# Patient Record
Sex: Male | Born: 1985
Health system: Southern US, Community
[De-identification: ages and names within clinical notes are randomized; demographics above are authoritative.]

## PROBLEM LIST (undated history)

## (undated) DIAGNOSIS — M109 Gout, unspecified: Secondary | ICD-10-CM

## (undated) DIAGNOSIS — Z87442 Personal history of urinary calculi: Secondary | ICD-10-CM

## (undated) DIAGNOSIS — I10 Essential (primary) hypertension: Secondary | ICD-10-CM

## (undated) DIAGNOSIS — K219 Gastro-esophageal reflux disease without esophagitis: Secondary | ICD-10-CM

## (undated) DIAGNOSIS — R945 Abnormal results of liver function studies: Secondary | ICD-10-CM

## (undated) DIAGNOSIS — T8859XA Other complications of anesthesia, initial encounter: Secondary | ICD-10-CM

## (undated) DIAGNOSIS — M1990 Unspecified osteoarthritis, unspecified site: Secondary | ICD-10-CM

## (undated) DIAGNOSIS — R7989 Other specified abnormal findings of blood chemistry: Secondary | ICD-10-CM

## (undated) HISTORY — DX: Gastro-esophageal reflux disease without esophagitis: K21.9

## (undated) HISTORY — DX: Unspecified osteoarthritis, unspecified site: M19.90

## (undated) HISTORY — DX: Abnormal results of liver function studies: R94.5

## (undated) HISTORY — DX: Other specified abnormal findings of blood chemistry: R79.89

## (undated) HISTORY — DX: Gout, unspecified: M10.9

## (undated) HISTORY — PX: WISDOM TOOTH EXTRACTION: SHX21

## (undated) HISTORY — PX: CHOLECYSTECTOMY: SHX55

## (undated) HISTORY — PX: KIDNEY STONE SURGERY: SHX686

---
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
## 2005-02-09 ENCOUNTER — Ambulatory Visit (HOSPITAL_COMMUNITY): Admission: RE | Admit: 2005-02-09 | Discharge: 2005-02-09 | Payer: Self-pay | Admitting: Family Medicine

## 2005-02-11 ENCOUNTER — Ambulatory Visit (HOSPITAL_COMMUNITY): Admission: RE | Admit: 2005-02-11 | Discharge: 2005-02-11 | Payer: Self-pay | Admitting: Urology

## 2005-02-24 ENCOUNTER — Ambulatory Visit (HOSPITAL_COMMUNITY): Admission: RE | Admit: 2005-02-24 | Discharge: 2005-02-24 | Payer: Self-pay | Admitting: Urology

## 2005-02-25 ENCOUNTER — Ambulatory Visit (HOSPITAL_COMMUNITY): Admission: RE | Admit: 2005-02-25 | Discharge: 2005-02-25 | Payer: Self-pay | Admitting: Urology

## 2005-03-01 ENCOUNTER — Ambulatory Visit (HOSPITAL_COMMUNITY): Admission: RE | Admit: 2005-03-01 | Discharge: 2005-03-01 | Payer: Self-pay | Admitting: Urology

## 2005-04-16 ENCOUNTER — Ambulatory Visit (HOSPITAL_COMMUNITY): Admission: RE | Admit: 2005-04-16 | Discharge: 2005-04-16 | Payer: Self-pay | Admitting: Urology

## 2005-05-09 ENCOUNTER — Emergency Department (HOSPITAL_COMMUNITY): Admission: EM | Admit: 2005-05-09 | Discharge: 2005-05-09 | Payer: Self-pay | Admitting: Emergency Medicine

## 2005-06-25 ENCOUNTER — Ambulatory Visit (HOSPITAL_COMMUNITY): Admission: RE | Admit: 2005-06-25 | Discharge: 2005-06-25 | Payer: Self-pay | Admitting: Urology

## 2005-08-18 ENCOUNTER — Ambulatory Visit (HOSPITAL_COMMUNITY): Admission: RE | Admit: 2005-08-18 | Discharge: 2005-08-18 | Payer: Self-pay | Admitting: Family Medicine

## 2009-02-24 ENCOUNTER — Emergency Department (HOSPITAL_COMMUNITY): Admission: EM | Admit: 2009-02-24 | Discharge: 2009-02-24 | Payer: Self-pay | Admitting: Emergency Medicine

## 2011-01-01 ENCOUNTER — Emergency Department (HOSPITAL_COMMUNITY)
Admission: EM | Admit: 2011-01-01 | Discharge: 2011-01-01 | Payer: Self-pay | Source: Home / Self Care | Admitting: Emergency Medicine

## 2011-03-25 LAB — URINALYSIS, ROUTINE W REFLEX MICROSCOPIC
Bilirubin Urine: NEGATIVE
Leukocytes, UA: NEGATIVE
Nitrite: NEGATIVE
Protein, ur: 30 mg/dL — AB
Urobilinogen, UA: 0.2 mg/dL (ref 0.0–1.0)

## 2011-04-30 NOTE — H&P (Signed)
NAMEWILFRIDO, [name]              ACCOUNT NO.:  [id number]   MEDICAL RECORD NO.:  [id number]           PATIENT TYPE:   LOCATION:                                 FACILITY:   PHYSICIAN:  Dennie Maizes, M.D.        DATE OF BIRTH:   DATE OF ADMISSION:  02/24/2005  DATE OF DISCHARGE:  LH                                HISTORY & PHYSICAL   CHIEF COMPLAINT:  Right upper ureter calculus with obstruction, right flank  pain, nausea and vomiting.   HISTORY OF PRESENT ILLNESS:  This 25 year old male was referred to me by Dr.  Renard Matter.  He experienced right flank pain radiating to the front associated  with severe nausea and vomiting.  He also had mild hematuria.  Evaluation  was done with a non-contrast CT scan of the abdomen and pelvis.  This  revealed a 6 mm size right upper ureteral calculus with obstruction and  hydronephrosis.  The patient was unable to pass the stone.  He has undergone  cystoscopy, retrograde pyelogram and right ureteral stent placement on February 11, 2005.  The patient has good pain relief at present.  No history of fever,  chills, voiding difficulty, gross hematuria.  He is brought to [facility] today for ESWL of right upper ureteral catheter.   PAST MEDICAL HISTORY:  1.  No medical illnesses.  2.  Status post cystoscopy and right ureteral stent placement on February 11, 2005.   MEDICATIONS:  None.   ALLERGIES:  None.   PAST SURGICAL HISTORY:  Negative.   FAMILY HISTORY:  Positive for heart disease, hypertension, and diabetes  mellitus.   PHYSICAL EXAMINATION:  VITAL SIGNS:  Height 5 feet 10 inches, weight 201  pounds.  HEENT: Normal.  NECK:  No masses.  LUNGS:  Clear to auscultation.  HEART:  Regular rate and rhythm. No murmurs.  ABDOMEN:  No palpable flank masses.  No costovertebral angle tenderness.  Bladder is not palpable.  Penis and testes are normal.   IMPRESSION:  Right upper ureteral calculus with obstruction, right  hydronephrosis,  right renal colic, status post right ureteral stent  placement.   PLAN:  Extracorporeal shock wave lithotripsy of the right upper ureteral  calculus with IV sedation in the short-stay center.  I have discussed with  the patient and his family regarding the diagnosis, operative details,  alternate treatments, outcome, possible risks and complications.  He agreed  to undergo the procedure.      SK/MEDQ  D:  02/24/2005  T:  02/24/2005  Job:  [id number]   cc:   Angus G. Renard Matter, MD  [address]  [place]  [place] [address]  Fax: [phone]   [facility]  Fax: [phone]

## 2011-04-30 NOTE — Op Note (Signed)
NAMEGREYSON, [name]              ACCOUNT NO.:  [id number]   MEDICAL RECORD NO.:  [id number]          PATIENT TYPE:  AMB   LOCATION:  DAY                           FACILITY:  [facility]   PHYSICIAN:  Dennie Maizes, M.D.   DATE OF BIRTH:  04-27-1986   DATE OF PROCEDURE:  03/01/2005  DATE OF DISCHARGE:                                 OPERATIVE REPORT   PREOPERATIVE DIAGNOSIS:  Right upper ureteral calculus, post ESL.   POSTOPERATIVE DIAGNOSIS:  Right upper ureteral calculus, post ESL.   OPERATIVE PROCEDURE:  Cystoscopy and removal of right ureteral stent.   ANESTHESIA:  General.   SURGEON:  Dennie Maizes, M.D.   COMPLICATIONS:  None.   INDICATIONS FOR PROCEDURE:  This 25 year old male was evaluated for severe  right flank pain. X-rays revealed a 8 x 6 mm size right upper ureteral  calculus with obstruction. The patient has undergone cystoscopy, right  ureteral stent placement and ESL of the right upper ureteral calculus.  Follow-up x-rays revealed good fragmentation of the right upper ureteral  calculus. The patient was taken to the OR today for cystoscopy and removal  of the right ureteral stent.   DESCRIPTION OF PROCEDURE:  General anesthesia was induced and the patient  was placed on the OR table in the dorsolithotomy position. The lower abdomen  and genitalia were prepped and draped in a sterile fashion. Cystoscopy was  done with a 25-French scope. The ureter, prostate and bladder were normal.  The lower end of the stent was then held with the grasping forceps and  removed without any difficulty. The cystoscope was then removed. The patient  was transferred to the PACU in a satisfactory condition.      SK/MEDQ  D:  03/01/2005  T:  03/01/2005  Job:  [id number]

## 2011-04-30 NOTE — Op Note (Signed)
[name], ELLERMAN              ACCOUNT NO.:  [id number]   MEDICAL RECORD NO.:  [id number]          PATIENT TYPE:  AMB   LOCATION:  DAY                           FACILITY:  [facility]   PHYSICIAN:  Dennie Maizes, M.D.   DATE OF BIRTH:  08/15/1986   DATE OF PROCEDURE:  02/11/2005  DATE OF DISCHARGE:                                 OPERATIVE REPORT   PREOPERATIVE DIAGNOSES:  Right upper ureteral calculus with obstruction,  right renal colic, and right hydronephrosis.   POSTOPERATIVE DIAGNOSIS:  Right upper ureteral calculus with obstruction,  right renal colic, and right hydronephrosis.   OPERATIVE PROCEDURE:  Cystoscopy, right retrograde pyelogram, right ureteral  stent placement.   ANESTHESIA:  General.   SURGEON:  Dennie Maizes, M.D.   COMPLICATIONS:  None.   DRAINS:  6 French, 26 cm size right ureteral stent.   INDICATIONS FOR PROCEDURE:  This 25 year old male had severe right flank  pain associated with nausea and vomiting.  His x-rays revealed an 8 x 6 mm  size right upper ureteral calculus with obstruction and hydronephrosis.  The  patient was unable to pass the stone.  He is taken to the OR today for  cystoscopy, right retrograde pyelogram and right ureteral stent placement.  The stone will later be treated with ESL as an outpatient.   DESCRIPTION OF PROCEDURE:  General anesthesia was induced and the patient  was placed on the OR table in the dorsal lithotomy position.  The lower  abdomen and genitalia were prepped and draped in a sterile fashion.  Cystoscopy was done with a 25-French scope.  The appearance of the urethra,  prostate and bladder were normal.  The trigone and ureteral orifices were unremarkable.  A 5-French wedge  catheter was then placed in the right ureteral orifice. About 7 cc of  Renografin 60 was injected into the collecting system and a retrograde  pyelogram was done.  The distal ureter was normal.  There was a large  filling defect in the right  upper ureter with proximal hydroureter and  hydronephrosis.   A 5-French open-ended catheter was then placed in the right distal ureter.  The 0.038-inch Benson guidewire with the flexible tip was then advanced into  the right renal pelvis.  The open-ended catheter was then removed.  A 6-  French, 26 cm size stent was then inserted to the right collecting system  without any difficulty.  The cystoscope was removed.  The patient was  transferred to the PACU in a satisfactory condition.      SK/MEDQ  D:  02/11/2005  T:  02/11/2005  Job:  [id number]   cc:   Angus G. Renard Matter, MD  [address]  [place]  [place] [address]  Fax: [phone]

## 2011-04-30 NOTE — H&P (Signed)
NAMEHENDRIK, [name]              ACCOUNT NO.:  [id number]   MEDICAL RECORD NO.:  [id number]          PATIENT TYPE:  AMB   LOCATION:  DAY                           FACILITY:  [facility]   PHYSICIAN:  Dennie Maizes, M.D.   DATE OF BIRTH:  [birth year]   DATE OF ADMISSION:  03/01/2005  DATE OF DISCHARGE:  LH                                HISTORY & PHYSICAL   CHIEF COMPLAINT:  Right upper ureteral calculus, post ESL.   HISTORY OF PRESENT ILLNESS:  This is an 25 year old male had right flank  pain.  His x-rays revealed an 8 x 6 mm right upper ureteral calculus without  obstruction, and hydronephrosis.  He has undergone cystoscopy, right  ureteral stent placement, and ESL of the right upper ureteral calculus.  Follow up chest x-rays have revealed that the stone has been fragmented  well.  The patient is going well as present.  He does not have flank pain.  He does have grossly hematuria.  He was brought to the short stay center  today for cystoscopy and removal of the right ureteral stent.   PAST MEDICAL HISTORY:  History of right upper ureteral calculus, status post  cystoscopy, right ureteral stent placement, and ESL.   MEDICATIONS:  Percocet p.r.n. for pain.   ALLERGIES:  None.   PHYSICAL EXAMINATION:  HEENT:  Normal.  NECK:  No masses.  LUNGS:  Clear to auscultation.  HEART:  Regular rate and rhythm.  No murmurs.  ABDOMEN:  Soft.  No palpable flank mass, CVA tenderness, or bladder  distention.   IMPRESSION:  Right upper ureteral calculus without obstruction, status post  right ureteral stent placement, post extracorporeal shockwave lithotripsy of  right ureteral calculus.   PLAN:  Cystoscopy and removal of right ureteral stent under anesthesia in  the short stay center.  I have discussed with the patient and his family  regarding diagnosis, operative details, alternative treatments, outcome,  possible risks and complications, and he has agreed for the procedure to be   done.      SK/MEDQ  D:  02/28/2005  T:  03/01/2005  Job:  [id number]   cc:   Angus G. Renard Matter, MD  [address]  [place]  [place] [address]  Fax: [phone]

## 2011-04-30 NOTE — H&P (Signed)
[name], [name]              ACCOUNT NO.:  [id number]   MEDICAL RECORD NO.:  [id number]          PATIENT TYPE:  AMB   LOCATION:  DAY                           FACILITY:  [facility]   PHYSICIAN:  Dennie Maizes, M.D.   DATE OF BIRTH:  21-Feb-1986   DATE OF ADMISSION:  02/11/2005  DATE OF DISCHARGE:  LH                                HISTORY & PHYSICAL   CHIEF COMPLAINT:  Right flank pain, nausea and vomiting.   HISTORY OF PRESENT ILLNESS:  This 25 year old male was referred to me by Dr.  Renard Matter.  He has been having intermittent severe, right, flank pain  radiating to the right lower quadrant of the abdomen for several days.  This  was associated with nausea and vomiting.  He has also noticed mild  hematuria.  Further evaluation was done with a noncontrast CT scan of the  abdomen and pelvis.  This revealed a 6 mm sized, right renal calculous with  obstruction and hydronephrosis.  The patient is unable to pass the stone.  He has persistent severe pain.  He is brought to the short stay center today  for cystoscopy, right retrograde pyelogram and right stent placement.  The  patient denied having any fever, chills or voiding difficulty at present.  There is no past history of urolithiasis.   PAST MEDICAL HISTORY:  No medical illnesses.   MEDICATIONS:  None.   ALLERGIES:  No known drug allergies.   PAST SURGICAL HISTORY:  None.   FAMILY HISTORY:  Positive for heart disease, hypertension, diabetes mellitus  and COPD.   PHYSICAL EXAMINATION:  VITAL SIGNS:  Height 5 feet 10 inches, weight 201  pounds.  HEENT:  Normal.  NECK:  No masses.  LUNGS:  Clear to auscultation.  HEART:  Regular rate and rhythm with no murmurs.  ABDOMEN:  Soft, nonpalpable flank mass.  Moderate costovertebral angle  tenderness was noted.  Bladder not palpable.  GENITALIA:  Penis and testes are normal.   IMPRESSION:  Right renal calculous with obstruction, right hydronephrosis,  right renal calculous.   PLAN:  Cystoscopy, right retrograde pyelogram and right ureteral stent  placement.  I have discussed with the patient regarding the diagnoses,  operative details, alternative treatments, all complications and risks.  He  has agreed for the procedure to be done.      SK/MEDQ  D:  02/11/2005  T:  02/11/2005  Job:  [id number]   cc:   Angus G. Renard Matter, MD  [address]  [place]  [place] [address]  Fax: [phone]

## 2013-05-04 ENCOUNTER — Ambulatory Visit (INDEPENDENT_AMBULATORY_CARE_PROVIDER_SITE_OTHER): Payer: BC Managed Care – PPO | Admitting: Family Medicine

## 2013-05-04 ENCOUNTER — Encounter: Payer: Self-pay | Admitting: Family Medicine

## 2013-05-04 VITALS — BP 120/88 | HR 80 | Ht 71.0 in | Wt 254.0 lb

## 2013-05-04 DIAGNOSIS — M109 Gout, unspecified: Secondary | ICD-10-CM

## 2013-05-04 NOTE — Progress Notes (Signed)
  Subjective:    Patient ID: [name], male    DOB: [birth year], 27 y.o.   MRN: [id number]  Ear Fullness  There is pain in the right ear. This is a new problem. The current episode started 1 to 4 weeks ago. The problem occurs constantly. The problem has been unchanged. There has been no fever. The pain is at a severity of 0/10. The patient is experiencing no pain. He has tried nothing for the symptoms. The treatment provided no relief.  Arthritis Presents for initial visit. The disease course has been stable. He complains of pain. (Gouty arthritis) Affected locations include the left foot and left ankle. His pain is at a severity of 0/10. Treatments tried: indomethacin and oxycodone. The treatment provided significant relief. Factors aggravating his arthritis include activity. Compliance with prior treatments has been good.   Family history negative for gout   Review of Systems  Musculoskeletal: Positive for arthritis.       Objective:   Physical Exam  Vital signs stable. Lungs clear heart regular. Ankle appears normal now no redness drainage.Eardrums were normal.      Assessment & Plan:  Your fullness related to allergies should gradually get better with over-the-counter measures were discussed. Doubt-check uric acid level may need to be on long-term medicine. Warning signs were discussed. He will use Indocin the next few days. If it bothers his stomach he will stop minimize proteins. Patient does not drink.

## 2013-05-05 DIAGNOSIS — M109 Gout, unspecified: Secondary | ICD-10-CM | POA: Insufficient documentation

## 2013-05-08 ENCOUNTER — Other Ambulatory Visit: Payer: Self-pay | Admitting: *Deleted

## 2013-05-08 ENCOUNTER — Telehealth: Payer: Self-pay | Admitting: Family Medicine

## 2013-05-08 ENCOUNTER — Telehealth: Payer: Self-pay | Admitting: *Deleted

## 2013-05-08 DIAGNOSIS — M109 Gout, unspecified: Secondary | ICD-10-CM

## 2013-05-08 NOTE — Telephone Encounter (Signed)
Add cbc and sed rate and met7 and do tom

## 2013-05-08 NOTE — Telephone Encounter (Signed)
Done. Pt aware, will get labs drawn in the morning

## 2013-05-08 NOTE — Telephone Encounter (Signed)
Patient gout has now moved to his knee and he is in a lot of pain. Please advise. Does his bloodwork need to be adjusted?

## 2013-05-08 NOTE — Telephone Encounter (Signed)
Patient was seen May 23, Was told to do Uric acid level 7-10 days after visit, which will be this Friday.  He has not had the uric acid done yet.  He feels the gout has moved to the knee ( some redness, mild swelling, tenderness to touch)  States hurts to bend or move it. He is asking if you still want him to do the uric acid on Friday or do it sooner and if so, would there be any other labs you want to add since his symptoms have moved too the knee per patient.

## 2013-05-08 NOTE — Telephone Encounter (Signed)
Pt aware to get labs done in the morning.

## 2013-05-08 NOTE — Telephone Encounter (Signed)
He has not had BW done yet. He wanted to know if the BW paperwork that he has now needs to be adjusted at all since it has moved in his knee?

## 2013-05-08 NOTE — Telephone Encounter (Deleted)
Was seen here in office May

## 2013-05-08 NOTE — Telephone Encounter (Signed)
Has he done bw? Need results--didn't see in system

## 2013-05-09 LAB — BASIC METABOLIC PANEL
BUN: 16 mg/dL (ref 6–23)
CO2: 27 mEq/L (ref 19–32)
Glucose, Bld: 110 mg/dL — ABNORMAL HIGH (ref 70–99)
Potassium: 4.3 mEq/L (ref 3.5–5.3)
Sodium: 141 mEq/L (ref 135–145)

## 2013-05-09 LAB — CBC WITH DIFFERENTIAL/PLATELET
Basophils Relative: 0 % (ref 0–1)
Eosinophils Absolute: 0.2 10*3/uL (ref 0.0–0.7)
Eosinophils Relative: 3 % (ref 0–5)
Hemoglobin: 15.2 g/dL (ref 13.0–17.0)
Lymphs Abs: 2.6 10*3/uL (ref 0.7–4.0)
Monocytes Relative: 10 % (ref 3–12)
RBC: 5.11 MIL/uL (ref 4.22–5.81)
WBC: 6.2 10*3/uL (ref 4.0–10.5)

## 2013-05-09 LAB — SEDIMENTATION RATE: Sed Rate: 17 mm/hr — ABNORMAL HIGH (ref 0–16)

## 2013-05-09 LAB — URIC ACID: Uric Acid, Serum: 9.4 mg/dL — ABNORMAL HIGH (ref 4.0–7.8)

## 2013-05-14 ENCOUNTER — Telehealth: Payer: Self-pay | Admitting: Family Medicine

## 2013-05-14 NOTE — Telephone Encounter (Signed)
Pt also states that the Pharmacist says he can not take both medications at the same time, however Dr Lorin Picket told pt to take them both together at the same time? What should the patient do?

## 2013-05-14 NOTE — Telephone Encounter (Signed)
Yes he can. Use the allopurinol daily but indocin is tid PRN gout pain

## 2013-05-14 NOTE — Telephone Encounter (Signed)
Medication for the gout (for the flair ups only) called is too expensive, not covered by insurance. Can we call in Indomethacin 25 mg instead, this one is cheaper and insurance will cover this one? [facility]

## 2013-05-14 NOTE — Telephone Encounter (Signed)
Does the patient take with the Allopurinol- the pharmacist told him he wouldn't take meds at same time but he was under impression that did

## 2013-05-14 NOTE — Telephone Encounter (Signed)
Med called into [facility] in [place] Patient notified that he can take meds together.

## 2013-05-14 NOTE — Telephone Encounter (Signed)
Indomethacin 25 mg one tid prn #30 in place of [facility]

## 2013-06-07 ENCOUNTER — Telehealth: Payer: Self-pay | Admitting: Family Medicine

## 2013-06-07 NOTE — Telephone Encounter (Signed)
Pt needs BW paperwork

## 2013-06-07 NOTE — Telephone Encounter (Signed)
Met 7 lipid, unless pt has other concerns

## 2013-06-08 ENCOUNTER — Other Ambulatory Visit: Payer: Self-pay

## 2013-06-08 DIAGNOSIS — Z79899 Other long term (current) drug therapy: Secondary | ICD-10-CM

## 2013-06-08 DIAGNOSIS — Z0000 Encounter for general adult medical examination without abnormal findings: Secondary | ICD-10-CM

## 2013-06-08 NOTE — Telephone Encounter (Signed)
Blood work papers ready for pickup. Patient was notified.  

## 2013-06-13 ENCOUNTER — Ambulatory Visit: Payer: BC Managed Care – PPO | Admitting: Family Medicine

## 2013-06-18 ENCOUNTER — Other Ambulatory Visit: Payer: Self-pay | Admitting: *Deleted

## 2013-06-18 DIAGNOSIS — M109 Gout, unspecified: Secondary | ICD-10-CM

## 2013-06-19 LAB — LIPID PANEL
HDL: 27 mg/dL — ABNORMAL LOW (ref >39)
LDL Cholesterol: 72 mg/dL (ref 0–99)
Total CHOL/HDL Ratio: 6.6 Ratio
VLDL: 78 mg/dL — ABNORMAL HIGH (ref 0–40)

## 2013-06-19 LAB — BASIC METABOLIC PANEL
Chloride: 102 mEq/L (ref 96–112)
Potassium: 3.9 mEq/L (ref 3.5–5.3)

## 2013-06-20 LAB — URIC ACID: Uric Acid, Serum: 7.7 mg/dL (ref 4.0–7.8)

## 2013-06-26 ENCOUNTER — Ambulatory Visit (INDEPENDENT_AMBULATORY_CARE_PROVIDER_SITE_OTHER): Payer: BC Managed Care – PPO | Admitting: Family Medicine

## 2013-06-26 ENCOUNTER — Encounter: Payer: Self-pay | Admitting: Family Medicine

## 2013-06-26 VITALS — BP 128/80 | Temp 98.0°F | Wt 250.8 lb

## 2013-06-26 DIAGNOSIS — L0390 Cellulitis, unspecified: Secondary | ICD-10-CM

## 2013-06-26 DIAGNOSIS — L0291 Cutaneous abscess, unspecified: Secondary | ICD-10-CM

## 2013-06-26 MED ORDER — AZITHROMYCIN 250 MG PO TABS: ORAL_TABLET | ORAL | Status: DC

## 2013-06-26 NOTE — Progress Notes (Signed)
  Subjective:    Patient ID: [name], male    DOB: [birth year], 27 y.o.   MRN: [id number]  HPI Developed red blemish. Puffed up. Was underneath a house round a lot of spiders.  Headache. No fever.  No application of meds.  Chills and needles with adding indomethacin to allopurinol. Had a couple bouts of arthritis. Diagnosed with probable gout. Uric acid was quite high in the nines.   Review of Systems No vomiting no diarrhea no fever no chills ROS otherwise negative    Objective:   Physical Exam  Alert lungs clear. Heart regular rate and rhythm. HEENT normal. Left dorsal hand small blister noted. Joints within normal limits.      Assessment & Plan:  Impression spider bite reaction possible secondary infection. #2 gallop with questionable side effects from indomethacin. Plan avoid indomethacin. Maintain allopurinol. Z-Pak. Symptomatic care discussed. WSL

## 2013-07-03 ENCOUNTER — Ambulatory Visit: Payer: BC Managed Care – PPO | Admitting: Family Medicine

## 2013-09-21 ENCOUNTER — Other Ambulatory Visit: Payer: Self-pay | Admitting: Family Medicine

## 2013-10-11 ENCOUNTER — Telehealth: Payer: Self-pay | Admitting: Family Medicine

## 2013-10-11 NOTE — Telephone Encounter (Signed)
Patient wants bloodwork paper

## 2013-10-16 ENCOUNTER — Other Ambulatory Visit: Payer: Self-pay

## 2013-10-16 DIAGNOSIS — Z79899 Other long term (current) drug therapy: Secondary | ICD-10-CM

## 2013-10-16 DIAGNOSIS — M109 Gout, unspecified: Secondary | ICD-10-CM

## 2013-10-16 DIAGNOSIS — Z0000 Encounter for general adult medical examination without abnormal findings: Secondary | ICD-10-CM

## 2013-10-16 NOTE — Telephone Encounter (Signed)
Bloodwork ordered in Epic. Notified patient's wife bloodwork ordered and patient can report to lab.

## 2013-10-16 NOTE — Telephone Encounter (Signed)
Lip liv uric acid 

## 2013-10-17 LAB — LIPID PANEL
Cholesterol: 182 mg/dL (ref 0–200)
HDL: 29 mg/dL — ABNORMAL LOW (ref >39)

## 2013-10-17 LAB — URIC ACID: Uric Acid, Serum: 6 mg/dL (ref 4.0–7.8)

## 2013-10-17 LAB — HEPATIC FUNCTION PANEL
ALT: 93 U/L — ABNORMAL HIGH (ref 0–53)
AST: 46 U/L — ABNORMAL HIGH (ref 0–37)
Bilirubin, Direct: 0.1 mg/dL (ref 0.0–0.3)
Total Protein: 7.2 g/dL (ref 6.0–8.3)

## 2013-10-18 ENCOUNTER — Ambulatory Visit (INDEPENDENT_AMBULATORY_CARE_PROVIDER_SITE_OTHER): Payer: BC Managed Care – PPO | Admitting: Family Medicine

## 2013-10-18 ENCOUNTER — Encounter: Payer: Self-pay | Admitting: Family Medicine

## 2013-10-18 VITALS — BP 124/88 | Ht 71.0 in | Wt 249.6 lb

## 2013-10-18 DIAGNOSIS — R748 Abnormal levels of other serum enzymes: Secondary | ICD-10-CM

## 2013-10-18 DIAGNOSIS — E785 Hyperlipidemia, unspecified: Secondary | ICD-10-CM

## 2013-10-18 DIAGNOSIS — R7989 Other specified abnormal findings of blood chemistry: Secondary | ICD-10-CM | POA: Insufficient documentation

## 2013-10-18 MED ORDER — ALLOPURINOL 300 MG PO TABS: ORAL_TABLET | ORAL | Status: DC

## 2013-10-18 MED ORDER — INDOMETHACIN 50 MG PO CAPS: 50.0000 mg | ORAL_CAPSULE | Freq: Four times a day (QID) | ORAL | Status: DC | PRN

## 2013-10-18 NOTE — Progress Notes (Signed)
  Subjective:    Patient ID: [name], male    DOB: 04-17-1986, 27 y.o.   MRN: [id number]  HPI  Patient is here today for a recheck on gout. He states it was better, but now it is back again. He also states his shoulders and elbows are hurting now.   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 He presume that he was experiencing gout in his elbows. He claims compliance with his allopurinol. No obvious side effects from this.  Had a flare and took the indomethacin on as needed basis  Feet no flare of arthritis  Hx of kidney stones Patient had hyperlipidemia on prior blood work. States he has worked a bit on trying to cut fats down diet but not as hard as he possibly could. Not exercising regularly.  Blood work shows other difficulties that we need to discuss today  Review of Systems    no chest pain no headache no back pain no abdominal pain no change in bowel habits ROS otherwise negative Objective:   Physical Exam  Alert hydration good. HEENT normal. Heart rare rhythm lateral elbows tender to palpation. Ankles feet within normal limits pulses good sensation good no inflammation      Assessment & Plan:  Impression 1 gout much improved. #2 lateral epicondylitis both elbows discussed not gout. #3 hyperlipidemia numbers better but not ideal discussed #4 elevated liver function tests. We did this in case he needed to go on lipid medicine. Unfortunately there elevated patient has no known history of this. He does not drink alcohol plan appropriate workup for elevated liver enzymes discussed and initiated. Maintain allopurinol. Use indomethacin when necessary. Low-fat diet discussed in encourage. Recheck in 6 months. Easily 25 minutes spent most in discussion. WSL

## 2013-10-19 ENCOUNTER — Ambulatory Visit: Payer: BC Managed Care – PPO | Admitting: Family Medicine

## 2013-10-23 ENCOUNTER — Ambulatory Visit (HOSPITAL_COMMUNITY)
Admission: RE | Admit: 2013-10-23 | Discharge: 2013-10-23 | Disposition: A | Discharge status: Home / Self Care | Payer: BC Managed Care – PPO | Source: Ambulatory Visit | Attending: Family Medicine | Admitting: Family Medicine

## 2013-10-23 DIAGNOSIS — R7989 Other specified abnormal findings of blood chemistry: Secondary | ICD-10-CM | POA: Insufficient documentation

## 2013-10-23 DIAGNOSIS — R748 Abnormal levels of other serum enzymes: Secondary | ICD-10-CM

## 2013-10-23 LAB — HEPATITIS C ANTIBODY: HCV Ab: NEGATIVE

## 2013-10-23 LAB — FERRITIN: Ferritin: 401 ng/mL — ABNORMAL HIGH (ref 22–322)

## 2013-10-23 LAB — HEPATITIS B SURFACE ANTIGEN: Hepatitis B Surface Ag: NEGATIVE

## 2014-01-21 ENCOUNTER — Encounter: Payer: Self-pay | Admitting: Nurse Practitioner

## 2014-01-21 ENCOUNTER — Ambulatory Visit (INDEPENDENT_AMBULATORY_CARE_PROVIDER_SITE_OTHER): Payer: BC Managed Care – PPO | Admitting: Nurse Practitioner

## 2014-01-21 VITALS — BP 132/86 | Temp 98.9°F | Ht 71.0 in | Wt 245.0 lb

## 2014-01-21 DIAGNOSIS — L089 Local infection of the skin and subcutaneous tissue, unspecified: Secondary | ICD-10-CM

## 2014-01-21 DIAGNOSIS — B958 Unspecified staphylococcus as the cause of diseases classified elsewhere: Secondary | ICD-10-CM

## 2014-01-21 DIAGNOSIS — R1011 Right upper quadrant pain: Secondary | ICD-10-CM

## 2014-01-21 DIAGNOSIS — K921 Melena: Secondary | ICD-10-CM

## 2014-01-21 DIAGNOSIS — K219 Gastro-esophageal reflux disease without esophagitis: Secondary | ICD-10-CM

## 2014-01-21 LAB — CBC WITH DIFFERENTIAL/PLATELET
Basophils Absolute: 0 10*3/uL (ref 0.0–0.1)
Basophils Relative: 1 % (ref 0–1)
EOS PCT: 2 % (ref 0–5)
Eosinophils Absolute: 0.1 10*3/uL (ref 0.0–0.7)
HEMATOCRIT: 44.2 % (ref 39.0–52.0)
HEMOGLOBIN: 15.3 g/dL (ref 13.0–17.0)
LYMPHS ABS: 3.3 10*3/uL (ref 0.7–4.0)
LYMPHS PCT: 42 % (ref 12–46)
MCH: 30.2 pg (ref 26.0–34.0)
MCHC: 34.6 g/dL (ref 30.0–36.0)
MCV: 87.4 fL (ref 78.0–100.0)
MONOS PCT: 8 % (ref 3–12)
Monocytes Absolute: 0.7 10*3/uL (ref 0.1–1.0)
Neutro Abs: 3.7 10*3/uL (ref 1.7–7.7)
Neutrophils Relative %: 47 % (ref 43–77)
Platelets: 283 10*3/uL (ref 150–400)
RBC: 5.06 MIL/uL (ref 4.22–5.81)
RDW: 13.7 % (ref 11.5–15.5)
WBC: 7.8 10*3/uL (ref 4.0–10.5)

## 2014-01-21 MED ORDER — MUPIROCIN 2 % EX OINT: TOPICAL_OINTMENT | CUTANEOUS | Status: DC

## 2014-01-21 MED ORDER — PANTOPRAZOLE SODIUM 40 MG PO TBEC: 40.0000 mg | DELAYED_RELEASE_TABLET | Freq: Every day | ORAL | Status: DC

## 2014-01-21 MED ORDER — AMOXICILLIN-POT CLAVULANATE 875-125 MG PO TABS: 1.0000 | ORAL_TABLET | Freq: Two times a day (BID) | ORAL | Status: DC

## 2014-01-21 NOTE — Patient Instructions (Signed)
Gastroesophageal Reflux Disease, Adult  Gastroesophageal reflux disease (GERD) happens when acid from your stomach flows up into the esophagus. When acid comes in contact with the esophagus, the acid causes soreness (inflammation) in the esophagus. Over time, GERD may create small holes (ulcers) in the lining of the esophagus.  CAUSES   · Increased body weight. This puts pressure on the stomach, making acid rise from the stomach into the esophagus.  · Smoking. This increases acid production in the stomach.  · Drinking alcohol. This causes decreased pressure in the lower esophageal sphincter (valve or ring of muscle between the esophagus and stomach), allowing acid from the stomach into the esophagus.  · Late evening meals and a full stomach. This increases pressure and acid production in the stomach.  · A malformed lower esophageal sphincter.  Sometimes, no cause is found.  SYMPTOMS   · Burning pain in the lower part of the mid-chest behind the breastbone and in the mid-stomach area. This may occur twice a week or more often.  · Trouble swallowing.  · Sore throat.  · Dry cough.  · Asthma-like symptoms including chest tightness, shortness of breath, or wheezing.  DIAGNOSIS   Your caregiver may be able to diagnose GERD based on your symptoms. In some cases, X-rays and other tests may be done to check for complications or to check the condition of your stomach and esophagus.  TREATMENT   Your caregiver may recommend over-the-counter or prescription medicines to help decrease acid production. Ask your caregiver before starting or adding any new medicines.   HOME CARE INSTRUCTIONS   · Change the factors that you can control. Ask your caregiver for guidance concerning weight loss, quitting smoking, and alcohol consumption.  · Avoid foods and drinks that make your symptoms worse, such as:  · Caffeine or alcoholic drinks.  · Chocolate.  · Peppermint or mint flavorings.  · Garlic and onions.  · Spicy foods.  · Citrus fruits,  such as oranges, lemons, or limes.  · Tomato-based foods such as sauce, chili, salsa, and pizza.  · Fried and fatty foods.  · Avoid lying down for the 3 hours prior to your bedtime or prior to taking a nap.  · Eat small, frequent meals instead of large meals.  · Wear loose-fitting clothing. Do not wear anything tight around your waist that causes pressure on your stomach.  · Raise the head of your bed 6 to 8 inches with wood blocks to help you sleep. Extra pillows will not help.  · Only take over-the-counter or prescription medicines for pain, discomfort, or fever as directed by your caregiver.  · Do not take aspirin, ibuprofen, or other nonsteroidal anti-inflammatory drugs (NSAIDs).  SEEK IMMEDIATE MEDICAL CARE IF:   · You have pain in your arms, neck, jaw, teeth, or back.  · Your pain increases or changes in intensity or duration.  · You develop nausea, vomiting, or sweating (diaphoresis).  · You develop shortness of breath, or you faint.  · Your vomit is green, yellow, black, or looks like coffee grounds or blood.  · Your stool is red, bloody, or black.  These symptoms could be signs of other problems, such as heart disease, gastric bleeding, or esophageal bleeding.  MAKE SURE YOU:   · Understand these instructions.  · Will watch your condition.  · Will get help right away if you are not doing well or get worse.  Document Released: 09/08/2005 Document Revised: 02/21/2012 Document Reviewed: 06/18/2011  ExitCare® Patient   Information ©2014 [employer].  Diet for Gastroesophageal Reflux Disease, Adult  Reflux (acid reflux) is when acid from your stomach flows up into the esophagus. When acid comes in contact with the esophagus, the acid causes irritation and soreness (inflammation) in the esophagus. When reflux happens often or so severely that it causes damage to the esophagus, it is called gastroesophageal reflux disease (GERD). Nutrition therapy can help ease the discomfort of GERD.  FOODS OR DRINKS TO AVOID OR  LIMIT  · Smoking or chewing tobacco. Nicotine is one of the most potent stimulants to acid production in the gastrointestinal tract.  · Caffeinated and decaffeinated coffee and black tea.  · Regular or low-calorie carbonated beverages or energy drinks (caffeine-free carbonated beverages are allowed).    · Strong spices, such as black pepper, white pepper, red pepper, cayenne, curry powder, and chili powder.  · Peppermint or spearmint.  · Chocolate.  · High-fat foods, including meats and fried foods. Extra added fats including oils, butter, salad dressings, and nuts. Limit these to less than 8 tsp per day.  · Fruits and vegetables if they are not tolerated, such as citrus fruits or tomatoes.  · Alcohol.  · Any food that seems to aggravate your condition.  If you have questions regarding your diet, call your caregiver or a registered dietitian.  OTHER THINGS THAT MAY HELP GERD INCLUDE:   · Eating your meals slowly, in a relaxed setting.  · Eating 5 to 6 small meals per day instead of 3 large meals.  · Eliminating food for a period of time if it causes distress.  · Not lying down until 3 hours after eating a meal.  · Keeping the head of your bed raised 6 to 9 inches (15 to 23 cm) by using a foam wedge or blocks under the legs of the bed. Lying flat may make symptoms worse.  · Being physically active. Weight loss may be helpful in reducing reflux in overweight or obese adults.  · Wear loose fitting clothing  EXAMPLE MEAL PLAN  This meal plan is approximately 2,000 calories based on ChooseMyPlate.gov meal planning guidelines.  Breakfast  · ½ cup cooked oatmeal.  · 1 cup strawberries.  · 1 cup low-fat milk.  · 1 oz almonds.  Snack  · 1 cup cucumber slices.  · 6 oz yogurt (made from low-fat or fat-free milk).  Lunch  · 2 slice whole-wheat bread.  · 2½ oz sliced turkey.  · 2 tsp mayonnaise.  · 1 cup blueberries.  · 1 cup snap peas.  Snack  · 6 whole-wheat crackers.  · 1 oz string cheese.  Dinner  · ½ cup brown rice.  · 1  cup mixed veggies.  · 1 tsp olive oil.  · 3 oz grilled fish.  Document Released: 11/29/2005 Document Revised: 02/21/2012 Document Reviewed: 10/15/2011  ExitCare® Patient Information ©2014 [employer].

## 2014-01-22 LAB — HEPATIC FUNCTION PANEL
ALK PHOS: 67 U/L (ref 39–117)
ALT: 51 U/L (ref 0–53)
AST: 31 U/L (ref 0–37)
Albumin: 4.9 g/dL (ref 3.5–5.2)
BILIRUBIN DIRECT: 0.2 mg/dL (ref 0.0–0.3)
BILIRUBIN INDIRECT: 0.6 mg/dL (ref 0.2–1.2)
Total Bilirubin: 0.8 mg/dL (ref 0.2–1.2)
Total Protein: 7.4 g/dL (ref 6.0–8.3)

## 2014-01-22 LAB — LIPASE: Lipase: 16 U/L (ref 0–75)

## 2014-01-22 LAB — H. PYLORI ANTIBODY, IGG: H Pylori IgG: <0.4 {ISR}

## 2014-01-24 ENCOUNTER — Encounter: Payer: Self-pay | Admitting: Nurse Practitioner

## 2014-01-24 DIAGNOSIS — K219 Gastro-esophageal reflux disease without esophagitis: Secondary | ICD-10-CM | POA: Insufficient documentation

## 2014-01-24 NOTE — Progress Notes (Signed)
Subjective:  Presents complaints of a rash in the left axillary area that began 2 days ago. Has had a similar problem once before. Mildly tender to palpation. No fever. No drainage. Also complaints of worsening acid reflux. Has a long-term history of this. Using OTC med, unsure of name, thinks it's omeprazole. No nausea vomiting. Chronic history of slightly loose stools, slightly black towards stools at least once per week. Denies any use OTC meds that would change the color of his stool. Nonsmoker. No oral tobacco use. No alcohol use. Drinks a large amount of caffeine daily. Epigastric area discomfort at times radiating into the chest area. Denies any excessive NSAID use. Extremely tight feeling in the epigastric area when he first starts eating, within 10-15 minutes then subsides with no further pain noted. Seems to be worse with tomato based foods. His mother also has a history of reflux disease. Patient also has a history of elevated liver enzymes. Had an abdominal RUQ ultrasound In November. Objective:   BP 132/86  Temp(Src) 98.9 F (37.2 C) (Oral)  Ht 5\' 11"  (1.803 m)  Wt 245 lb (111.131 kg)  BMI 34.19 kg/m2 NAD. Alert, oriented. Lungs clear. Heart regular rate rhythm. Superficial small eroded area noted towards the posterior left axillary area with a slight yellowish dried drainage. Minimally tender to palpation. Abdomen soft nondistended with active bowel sounds x4; tenderness noted towards the right epigastric area into the mid to right upper quadrant. No rebound or guarding. No obvious masses.  Assessment:Abdominal pain, right upper quadrant - Plan: CBC with Differential, Lipase, Hepatic function panel, H. pylori antibody, IgG  GERD (gastroesophageal reflux disease) - Plan: CBC with Differential, H. pylori antibody, IgG  Melena - Plan: CBC with Differential, H. pylori antibody, IgG  Staph skin infection  Plan: Meds ordered this encounter  Medications  . pantoprazole (PROTONIX) 40 MG  tablet    Sig: Take 1 tablet (40 mg total) by mouth daily. For acid reflux    Dispense:  30 tablet    Refill:  2    Order Specific Question:  Supervising Provider    Answer:  Mikey Kirschner [[id number]]  . amoxicillin-clavulanate (AUGMENTIN) 875-125 MG per tablet    Sig: Take 1 tablet by mouth 2 (two) times daily.    Dispense:  14 tablet    Refill:  0    Order Specific Question:  Supervising Provider    Answer:  Mikey Kirschner [[id number]]  . mupirocin ointment (BACTROBAN) 2 %    Sig: Apply to rash under arm TID prn    Dispense:  22 g    Refill:  0    Order Specific Question:  Supervising Provider    Answer:  Mikey Kirschner [[id number]]   Warning signs reviewed. Further followup based on test results. Call back if symptoms worsen. Also call back if skin infection persists.

## 2014-01-25 ENCOUNTER — Other Ambulatory Visit: Payer: Self-pay | Admitting: Nurse Practitioner

## 2014-01-25 DIAGNOSIS — K219 Gastro-esophageal reflux disease without esophagitis: Secondary | ICD-10-CM

## 2014-01-31 ENCOUNTER — Encounter: Payer: Self-pay | Admitting: Gastroenterology

## 2014-02-06 ENCOUNTER — Encounter: Payer: Self-pay | Admitting: Nurse Practitioner

## 2014-02-06 ENCOUNTER — Ambulatory Visit (INDEPENDENT_AMBULATORY_CARE_PROVIDER_SITE_OTHER): Payer: BC Managed Care – PPO | Admitting: Nurse Practitioner

## 2014-02-06 VITALS — BP 124/80 | Ht 71.0 in | Wt 247.1 lb

## 2014-02-06 DIAGNOSIS — M7710 Lateral epicondylitis, unspecified elbow: Secondary | ICD-10-CM

## 2014-02-06 DIAGNOSIS — M7712 Lateral epicondylitis, left elbow: Secondary | ICD-10-CM

## 2014-02-06 DIAGNOSIS — R631 Polydipsia: Secondary | ICD-10-CM

## 2014-02-06 DIAGNOSIS — Z0189 Encounter for other specified special examinations: Secondary | ICD-10-CM

## 2014-02-06 DIAGNOSIS — K219 Gastro-esophageal reflux disease without esophagitis: Secondary | ICD-10-CM

## 2014-02-06 LAB — GLUCOSE, POCT (MANUAL RESULT ENTRY): POC Glucose: 76 mg/dl (ref 70–99)

## 2014-02-10 ENCOUNTER — Encounter: Payer: Self-pay | Admitting: Nurse Practitioner

## 2014-02-10 NOTE — Progress Notes (Signed)
Subjective:  Presents with his wife for recheck on his reflux. Symptoms are much improved. Has had a slight headache taking Protonix but does not wish to stop it at this time. Has slightly decreased his caffeine intake. Has an appointment with gastroenterologist in the near future. His wife is concerned because he tends to drink a lot of fluids particularly sodas. Requesting a random blood sugar. No other specific reason. Also has been having some pain in the left lateral elbow area, no specific history of injury but has a very active job requiring or movement. Tends to come and go. Has been there for a few weeks. Note he has not been taking indomethacin for a long time.  Objective:   BP 124/80  Ht 5\' 11"  (1.803 m)  Wt 247 lb 2 oz (112.095 kg)  BMI 34.48 kg/m2 NAD. Alert, oriented. Lungs clear. Heart regular rhythm. Abdomen soft nondistended nontender. Good ROM of the left elbow, tenderness noted towards the lateral left epicondyle area. Hand strength 5+ bilateral. Radial pulses strong. Sensation grossly intact. Results for orders placed in visit on 02/06/14  GLUCOSE, POCT (MANUAL RESULT ENTRY)      Result Value Ref Range   POC Glucose 76  70 - 99 mg/dl     Assessment:  Problem List Items Addressed This Visit     Digestive   GERD (gastroesophageal reflux disease) - Primary    Other Visit Diagnoses   Other specified examination        Relevant Orders       POCT glucose (manual entry) (Completed)    Lateral epicondylitis of left elbow          Reassured about normal glucose. Patient works in different environments which requires increased hydration. Given written and verbal information on epicondylitis. Use anti-inflammatories very sparingly due to reflux symptoms. Hold on indomethacin. Keep appointment in March with GI specialist. Call back sooner if symptoms worsen. Patient mentions some chronic left knee pain at the end of the visit, recommend another visit in the future to discuss  further.

## 2014-02-19 ENCOUNTER — Ambulatory Visit (INDEPENDENT_AMBULATORY_CARE_PROVIDER_SITE_OTHER): Payer: BC Managed Care – PPO | Admitting: Gastroenterology

## 2014-02-19 ENCOUNTER — Encounter: Payer: Self-pay | Admitting: Gastroenterology

## 2014-02-19 ENCOUNTER — Other Ambulatory Visit: Payer: Self-pay | Admitting: Gastroenterology

## 2014-02-19 VITALS — BP 143/83 | HR 76 | Temp 97.6°F | Wt 248.2 lb

## 2014-02-19 DIAGNOSIS — K625 Hemorrhage of anus and rectum: Secondary | ICD-10-CM

## 2014-02-19 DIAGNOSIS — K921 Melena: Secondary | ICD-10-CM

## 2014-02-19 DIAGNOSIS — K219 Gastro-esophageal reflux disease without esophagitis: Secondary | ICD-10-CM

## 2014-02-19 MED ORDER — PEG 3350-KCL-NA BICARB-NACL 420 G PO SOLR: 4000.0000 mL | ORAL | Status: DC

## 2014-02-19 NOTE — Progress Notes (Signed)
Primary Care Physician:  Rubbie Battiest, MD Primary Gastroenterologist:  Dr. Oneida Alar   Chief Complaint  Patient presents with  . Melena    abd pain at time with it  . Gastrophageal Reflux  . Rectal Bleeding    3-4 days ago    HPI:   [name] presents today at the request of Pearson Forster, NP/Dr. Wolfgang Phoenix secondary to GERD, possible melena. H.pylori serology normal. CBC, lipase normal. Notes black, thick/sticky stool for several months. Has spells intermittently with fresh, bright red blood per rectum. No constipation, diarrhea. Severe indigestion despite OTC acid reducer. Started on Protonix with some improvement X 1 month. While eating notes upper abdominal discomfort, tight pain, intermittently. Indomethacin on med list but takes very sparingly. No other NSAIDs or aspirin powders. No dysphagia. No fever/chills. No N/V, early satiety.   Past Medical History  Diagnosis Date  . Chronic kidney disease     kidney stones  . Arthritis   . Gout   . Elevated LFTs     mildly elevated transaminases in past, now normalized     Past Surgical History  Procedure Laterality Date  . Kidney stone surgery      lithotripsy, stent  . Wisdom tooth extraction      Current Outpatient Prescriptions  Medication Sig Dispense Refill  . allopurinol (ZYLOPRIM) 300 MG tablet TAKE ONE TABLET BY MOUTH ONCE DAILY  30 tablet  5  . indomethacin (INDOCIN) 50 MG capsule Take 1 capsule (50 mg total) by mouth 4 (four) times daily as needed.  30 capsule  2  . pantoprazole (PROTONIX) 40 MG tablet Take 1 tablet (40 mg total) by mouth daily. For acid reflux  30 tablet  2   No current facility-administered medications for this visit.    Allergies as of 02/19/2014  . (No Known Allergies)    Family History  Problem Relation Age of Onset  . Diabetes Father   . COPD Father   . Colon cancer Neg Hx     does not know paternal side    History   Social History  . Marital Status: Married    Spouse  Name: N/A    Number of Children: N/A  . Years of Education: N/A   Occupational History  . Construction    Social History Main Topics  . Smoking status: Never Smoker   . Smokeless tobacco: Not on file  . Alcohol Use: No  . Drug Use: No  . Sexual Activity: Not on file   Other Topics Concern  . Not on file   Social History Narrative  . No narrative on file    Review of Systems: Gen: see HPI CV: Denies chest pain, heart palpitations, peripheral edema, syncope.  Resp: Denies shortness of breath at rest or with exertion. Denies wheezing or cough.  GI: see HPI GU : Denies urinary burning, urinary frequency, urinary hesitancy MS: +joint pain Derm: Denies rash, itching, dry skin Psych: Denies depression, anxiety, memory loss, and confusion Heme: Denies bruising, bleeding, and enlarged lymph nodes.  Physical Exam: BP 143/83  Pulse 76  Temp(Src) 97.6 F (36.4 C) (Oral)  Wt 248 lb 3.2 oz (112.583 kg) General:   Alert and oriented. Pleasant and cooperative. Well-nourished and well-developed.  Head:  Normocephalic and atraumatic. Eyes:  Without icterus, sclera clear and conjunctiva pink.  Ears:  Normal auditory acuity. Nose:  No deformity, discharge,  or lesions. Mouth:  No deformity or lesions, oral mucosa pink.  Neck:  Supple, without mass or thyromegaly. Lungs:  Clear to auscultation bilaterally. No wheezes, rales, or rhonchi. No distress.  Heart:  S1, S2 present without murmurs appreciated.  Abdomen:  +BS, soft, non-tender and non-distended. No HSM noted. No guarding or rebound. No masses appreciated.  Rectal:  Deferred  Msk:  Symmetrical without gross deformities. Normal posture. Extremities:  Without clubbing or edema. Neurologic:  Alert and  oriented x4;  grossly normal neurologically. Skin:  Intact without significant lesions or rashes. Cervical Nodes:  No significant cervical adenopathy. Psych:  Alert and cooperative. Normal mood and affect.  Lab Results  Component  Value Date   WBC 7.8 01/21/2014   HGB 15.3 01/21/2014   HCT 44.2 01/21/2014   MCV 87.4 01/21/2014   PLT 283 01/21/2014   Lab Results  Component Value Date   ALT 51 01/21/2014   AST 31 01/21/2014   ALKPHOS 67 01/21/2014   BILITOT 0.8 01/21/2014

## 2014-02-19 NOTE — Patient Instructions (Signed)
We have scheduled you for a colonoscopy and upper endoscopy with Dr. Oneida Alar in the near future.  Make sure you are taking Protonix each morning on an empty stomach, 30 minutes prior to breakfast.

## 2014-02-22 ENCOUNTER — Encounter (HOSPITAL_COMMUNITY): Payer: Self-pay | Admitting: Pharmacy Technician

## 2014-02-23 DIAGNOSIS — K921 Melena: Secondary | ICD-10-CM | POA: Insufficient documentation

## 2014-02-23 DIAGNOSIS — K625 Hemorrhage of anus and rectum: Secondary | ICD-10-CM | POA: Insufficient documentation

## 2014-02-23 NOTE — Assessment & Plan Note (Signed)
28 year old with severe GERD that has mildly improved with prescription PPI, on Protonix currently. Possible melena reported but stable CBC. Doubt if true melena. Indomethacin rarely, otherwise no other NSAIDs or aspirin powders. Intermittent epigastric discomfort associated with eating. Query gastritis, PUD, less likely biliary component but unable to exclude. Gallbladder remains in situ.  Proceed with upper endoscopy in the near future with Dr. Oneida Alar. The risks, benefits, and alternatives have been discussed in detail with patient. They have stated understanding and desire to proceed.  Phenergan 25 mg IV on call (paitent reports history of failed sedation in past during lithotripsy) Continue Protonix daily

## 2014-02-23 NOTE — Assessment & Plan Note (Signed)
Painless low-volume hematochezia. Likely benign anorectal source.   Proceed with colonoscopy with Dr. Oneida Alar in the near future. The risks, benefits, and alternatives have been discussed in detail with the patient. They state understanding and desire to proceed.  Phenergan 25 mg IV on call due to history of failed sedation.

## 2014-02-23 NOTE — Assessment & Plan Note (Signed)
EGD as planned. 

## 2014-02-25 ENCOUNTER — Encounter (HOSPITAL_COMMUNITY)
Admission: RE | Disposition: A | Discharge status: Home / Self Care | Payer: Self-pay | Source: Ambulatory Visit | Attending: Gastroenterology

## 2014-02-25 ENCOUNTER — Encounter (HOSPITAL_COMMUNITY): Payer: Self-pay | Admitting: Pharmacy Technician

## 2014-02-25 ENCOUNTER — Other Ambulatory Visit: Payer: Self-pay | Admitting: Gastroenterology

## 2014-02-25 ENCOUNTER — Ambulatory Visit (HOSPITAL_COMMUNITY)
Admission: RE | Admit: 2014-02-25 | Discharge: 2014-02-25 | Disposition: A | Discharge status: Home / Self Care | Payer: BC Managed Care – PPO | Source: Ambulatory Visit | Attending: Gastroenterology | Admitting: Gastroenterology

## 2014-02-25 ENCOUNTER — Encounter (HOSPITAL_COMMUNITY): Payer: Self-pay | Admitting: *Deleted

## 2014-02-25 DIAGNOSIS — D126 Benign neoplasm of colon, unspecified: Secondary | ICD-10-CM | POA: Insufficient documentation

## 2014-02-25 DIAGNOSIS — R7989 Other specified abnormal findings of blood chemistry: Secondary | ICD-10-CM | POA: Insufficient documentation

## 2014-02-25 DIAGNOSIS — K649 Unspecified hemorrhoids: Secondary | ICD-10-CM

## 2014-02-25 DIAGNOSIS — K219 Gastro-esophageal reflux disease without esophagitis: Secondary | ICD-10-CM

## 2014-02-25 DIAGNOSIS — D132 Benign neoplasm of duodenum: Secondary | ICD-10-CM

## 2014-02-25 DIAGNOSIS — M109 Gout, unspecified: Secondary | ICD-10-CM | POA: Insufficient documentation

## 2014-02-25 DIAGNOSIS — K2990 Gastroduodenitis, unspecified, without bleeding: Secondary | ICD-10-CM

## 2014-02-25 DIAGNOSIS — K921 Melena: Secondary | ICD-10-CM

## 2014-02-25 DIAGNOSIS — M129 Arthropathy, unspecified: Secondary | ICD-10-CM | POA: Insufficient documentation

## 2014-02-25 DIAGNOSIS — K625 Hemorrhage of anus and rectum: Secondary | ICD-10-CM

## 2014-02-25 DIAGNOSIS — K648 Other hemorrhoids: Secondary | ICD-10-CM | POA: Insufficient documentation

## 2014-02-25 DIAGNOSIS — Z87442 Personal history of urinary calculi: Secondary | ICD-10-CM | POA: Insufficient documentation

## 2014-02-25 DIAGNOSIS — D131 Benign neoplasm of stomach: Secondary | ICD-10-CM | POA: Insufficient documentation

## 2014-02-25 DIAGNOSIS — K2970 Gastritis, unspecified, without bleeding: Secondary | ICD-10-CM | POA: Insufficient documentation

## 2014-02-25 HISTORY — PX: COLONOSCOPY WITH ESOPHAGOGASTRODUODENOSCOPY (EGD): SHX5779

## 2014-02-25 SURGERY — COLONOSCOPY WITH ESOPHAGOGASTRODUODENOSCOPY (EGD)
Anesthesia: Moderate Sedation

## 2014-02-25 MED ORDER — MIDAZOLAM HCL 5 MG/5ML IJ SOLN
INTRAMUSCULAR | Status: DC | PRN
  Administered 2014-02-25 (×4): 2 mg via INTRAVENOUS

## 2014-02-25 MED ORDER — SODIUM CHLORIDE 0.9 % IV SOLN
INTRAVENOUS | Status: DC
  Administered 2014-02-25: 10:00:00 via INTRAVENOUS

## 2014-02-25 MED ORDER — LIDOCAINE VISCOUS 2 % MT SOLN
OROMUCOSAL | Status: AC
  Filled 2014-02-25: qty 15

## 2014-02-25 MED ORDER — MEPERIDINE HCL 100 MG/ML IJ SOLN
INTRAMUSCULAR | Status: DC | PRN
  Administered 2014-02-25: 25 mg via INTRAVENOUS
  Administered 2014-02-25: 50 mg via INTRAVENOUS
  Administered 2014-02-25: 25 mg via INTRAVENOUS
  Administered 2014-02-25: 50 mg via INTRAVENOUS

## 2014-02-25 MED ORDER — SODIUM CHLORIDE 0.9 % IJ SOLN
INTRAMUSCULAR | Status: AC
  Filled 2014-02-25: qty 10

## 2014-02-25 MED ORDER — PROMETHAZINE HCL 25 MG/ML IJ SOLN
25.0000 mg | Freq: Once | INTRAMUSCULAR | Status: AC
  Administered 2014-02-25: 25 mg via INTRAVENOUS

## 2014-02-25 MED ORDER — MEPERIDINE HCL 100 MG/ML IJ SOLN
INTRAMUSCULAR | Status: AC
  Filled 2014-02-25: qty 2

## 2014-02-25 MED ORDER — MIDAZOLAM HCL 5 MG/5ML IJ SOLN
INTRAMUSCULAR | Status: AC
  Filled 2014-02-25: qty 10

## 2014-02-25 MED ORDER — PROMETHAZINE HCL 25 MG/ML IJ SOLN
INTRAMUSCULAR | Status: AC
  Filled 2014-02-25: qty 1

## 2014-02-25 MED ORDER — SIMETHICONE 40 MG/0.6ML PO SUSP
ORAL | Status: DC | PRN
  Administered 2014-02-25: 11:00:00

## 2014-02-25 NOTE — Op Note (Addendum)
[facility] [address] [place], [address]   COLONOSCOPY PROCEDURE REPORT  PATIENT: [name], [name]  MR#: [id number] BIRTHDATE: 1986/11/03 , 27  yrs. old GENDER: Male ENDOSCOPIST: Barney Drain, MD REFERRED GY:FVCBS Wolfgang Phoenix, M.D. CAROLYN HOSKINS, NP-C PROCEDURE DATE:  02/25/2014 PROCEDURE:   Colonoscopy with ARC Endocuff, snare polypectomy, Submucosal injection: 2 CC SPOT TATTOO, ONE RESOLUTION CLIP PLACED, & Hemorrhoidectomy via banding(3). INDICATIONS:Rectal Bleeding: ONCE A WEEK. BLACK TARRY STOOLS-3-4 TIMES A WEEK FOR THE PAST 6-8 MOS. MEDICATIONS: Demerol 125 mg IV and Versed 6 mg IV  DESCRIPTION OF PROCEDURE:    Physical exam was performed.  Informed consent was obtained from the patient after explaining the benefits, risks, and alternatives to procedure.  The patient was connected to monitor and placed in left lateral position. Continuous oxygen was provided by nasal cannula and IV medicine administered through an indwelling cannula.  After administration of sedation and rectal exam, the patients rectum was intubated and the EC-3890Li ([id number]), EG-2990i ([id number]), and EG-2990i ([id number]) colonoscope was advanced under direct visualization to the ileum. The scope was removed slowly by carefully examining the color, texture, anatomy, and integrity mucosa on the way out.  The patient was recovered in endoscopy and discharged home in satisfactory condition.    COLON FINDINGS: The mucosa appeared normal in the terminal ileum.  , A sessile polyp measuring 6 mm in size was found in the sigmoid colon.  A polypectomy was performed using snare cautery.  , A pedunculated polyp measuring 1.2 cm in size was found in the sigmoid colon.  A polypectomy was performed using snare cautery. By placing hemoclips.  One (1) placement was made.  A tattoo was applied.  , and Large internal hemorrhoids were found.   3 bands applied.  PREP QUALITY: good.  CECAL W/D TIME:  29 minutes     COMPLICATIONS: None  ENDOSCOPIC IMPRESSION: 1.   Normal mucosa in the terminal ileum 2.   ONE SMALL polyp AND ONE LARGE POLYP REMOVED FROM the sigmoid colon 3.   RECTAL BLEEDING due to large sigmoid colon polyp and Large internal hemorrhoids  RECOMMENDATIONS: CALL [phone] FOR RECTAL BLEEDING/PAIN, FEVER OR DIFFICULTY URINATING.  Continue PROTONIX DAILY. FOLLOW A LOW RESIDUE DIET FOR 2 WEEKS.  AVOID ITEMS THAT CAUSE BLOATING. COMPLETE A GIVENS CAPSULE STUDY NEXT WEEK. ENDOSCOPIC ULTRASOUND WITHIN THE NEXT 2 WEEKS TO EVALUATE AND REMOVE DUODENAL ADENOMAS INVOLVING THE MAJOR AND MINOR PAPILLA. BIOPSY WILL BE BACK IN 7 DAYS.  FOLLOW UP IN 3 WEEKS. NO MRI FOR 30 DAYS.       _______________________________ Lorrin MaisBarney Drain, MD 02/25/2014 1:08 PM     PATIENT NAME:  [name], [name] MR#: [id number]

## 2014-02-25 NOTE — Progress Notes (Signed)
REVIEWED.  

## 2014-02-25 NOTE — Op Note (Addendum)
[facility] [address] [place], [address]   ENDOSCOPY PROCEDURE REPORT  PATIENT: [name], [name]  MR#: [id number] BIRTHDATE: [birth year] , 27  yrs. old GENDER: Male  ENDOSCOPIST: Barney Drain, MD REFERRED JG:OTLXBWI Wolfgang Phoenix, M.D.  Arta Silence, M.D. HOSKINS, CAROLYN, NP-C  PROCEDURE DATE: 02/25/2014 PROCEDURE:   EGD w/ biopsy  INDICATIONS:Melena. 1.2 CM POLYP REMOVED FROM SIGMOID COLON. MEDICATIONS: TCS +  Demerol 25 mg IV and Versed 2 mg IV TOPICAL ANESTHETIC:   Viscous Xylocaine  DESCRIPTION OF PROCEDURE:     Physical exam was performed.  Informed consent was obtained from the patient after explaining the benefits, risks, and alternatives to the procedure.  The patient was connected to the monitor and placed in the left lateral position.  Continuous oxygen was provided by nasal cannula and IV medicine administered through an indwelling cannula.  After administration of sedation, the patients esophagus was intubated and the EG-2990i ([id number])  endoscope was advanced under direct visualization to the second portion of the duodenum.  The scope was removed slowly by carefully examining the color, texture, anatomy, and integrity of the mucosa on the way out.  The patient was recovered in endoscopy and discharged home in satisfactory condition.   ESOPHAGUS: The mucosa of the esophagus appeared normal.   STOMACH: Multiple small sessile polyps were found in the gastric fundus. Multiple biopsies was performed using cold forceps.   Mild non-erosive gastritis (inflammation) was found in the gastric antrum.  Multiple biopsies were performed using cold forceps. DUODENUM: The duodenal mucosa showed no abnormalities in the duodenal bulb.   ADENOMATOUS APPEARING CHANGES ASSOCIATED WITH MAJOR AND MINOR PAPILLA.  BIOPSIES DEFERRED DUE TO RISK OF INDUCING PANCREATITIS. COMPLICATIONS:   None  ENDOSCOPIC IMPRESSION: 1.   NO SOURCE FOR MELENA IDENTIFIED 2.    Multiple GASTRIC sessile polyp in the gastric fundus-MOST LIKELY FUNDIC GLAND POLYPS 3.   MILD Non-erosive gastritis 4.   PROBABLE DUODENAL ADENOMAS INVOLVING THE MAJOR AND MINOR PAPILLA.  RECOMMENDATIONS: CALL [phone] FOR RECTAL BLEEDING/PAIN, FEVER OR DIFFICULTY URINATING.  Continue PROTONIX DAILY. FOLLOW A LOW RESIDUE DIET FOR 2 WEEKS.  AVOID ITEMS THAT CAUSE BLOATING. COMPLETE A GIVENS CAPSULE STUDY NEXT WEEK. ENDOSCOPIC ULTRASOUND WITHIN THE NEXT 2 WEEKS TO EVALUATE AND REMOVE DUODENAL ADENOMAS INVOLVING THE MAJOR AND MINOR PAPILLA. BIOPSY WILL BE BACK IN 7 DAYS.  FOLLOW UP IN 3 WEEKS.  CONSIDER GENETIC TESTING FOR ATTENUATED ADENOMATOUS POLYPOSIS SYNDROME. NO MRI FOR 30 DAYS.   REPEAT EXAM:   _______________________________ Lorrin MaisBarney Drain, MD 02/25/2014 1:23 PM       PATIENT NAME:  [name], [name] MR#: [id number]

## 2014-02-25 NOTE — H&P (Signed)
  Primary Care Physician:  Rubbie Battiest, MD Primary Gastroenterologist:  Dr. Oneida Alar  Pre-Procedure History & Physical: HPI:  [name] is a 28 y.o. male here for MELENA3-4X/WEEK/BRBPR-1X/WEEK.  Past Medical History  Diagnosis Date  . Chronic kidney disease     kidney stones  . Arthritis   . Gout   . Elevated LFTs     mildly elevated transaminases in past, now normalized     Past Surgical History  Procedure Laterality Date  . Kidney stone surgery      lithotripsy, stent  . Wisdom tooth extraction      Prior to Admission medications   Medication Sig Start Date End Date Taking? Authorizing Provider  allopurinol (ZYLOPRIM) 300 MG tablet Take 300 mg by mouth daily. 10/18/13  Yes Mikey Kirschner, MD  indomethacin (INDOCIN) 50 MG capsule Take 50 mg by mouth 4 (four) times daily as needed for mild pain. 10/18/13  Yes Mikey Kirschner, MD  pantoprazole (PROTONIX) 40 MG tablet Take 1 tablet (40 mg total) by mouth daily. For acid reflux 01/21/14  Yes Nilda Simmer, NP  polyethylene glycol-electrolytes (TRILYTE) 420 G solution Take 4,000 mLs by mouth as directed. 02/19/14   Danie Binder, MD    Allergies as of 02/19/2014  . (No Known Allergies)    Family History  Problem Relation Age of Onset  . Diabetes Father   . COPD Father   . Colon cancer Neg Hx     does not know paternal side    History   Social History  . Marital Status: Married    Spouse Name: N/A    Number of Children: N/A  . Years of Education: N/A   Occupational History  . Construction    Social History Main Topics  . Smoking status: Never Smoker   . Smokeless tobacco: Not on file  . Alcohol Use: No  . Drug Use: No  . Sexual Activity: Not on file   Other Topics Concern  . Not on file   Social History Narrative  . No narrative on file    Review of Systems: See HPI, otherwise negative ROS   Physical Exam: BP 126/82  Pulse 70  Temp(Src) 98 F (36.7 C) (Oral)  Resp 18  SpO2 100% General:    Alert,  pleasant and cooperative in NAD Head:  Normocephalic and atraumatic. Neck:  Supple; Lungs:  Clear throughout to auscultation.    Heart:  Regular rate and rhythm. Abdomen:  Soft, nontender and nondistended. Normal bowel sounds, without guarding, and without rebound.   Neurologic:  Alert and  oriented x4;  grossly normal neurologically.  Impression/Plan:    MELENA/BRBPR  PLAN: 1. EGD/TCS/?HEMORRHOID BANDING TODAY

## 2014-02-25 NOTE — Progress Notes (Signed)
cc'd to pcp 

## 2014-02-28 ENCOUNTER — Telehealth: Payer: Self-pay | Admitting: Gastroenterology

## 2014-02-28 ENCOUNTER — Encounter (HOSPITAL_COMMUNITY): Payer: Self-pay | Admitting: Gastroenterology

## 2014-02-28 NOTE — Telephone Encounter (Signed)
Pt's mother called for patient. He is scheduled for a capsule study on 3/30 and he has some concerns/questions regarding this. Please call him back at [phone]

## 2014-03-01 NOTE — Telephone Encounter (Signed)
Patient returned my call and I answered his question

## 2014-03-01 NOTE — Telephone Encounter (Addendum)
ATTEMPTED TO CALL [employer] PROVIDER LINE X3 TO APPEAL DENIAL. [employer] DOESN'T SEEM TO BE ABLE TO ASSIST IN THIS MATTER. PLEASE OBTAIN A PHONE NUMBER TO CALL TO APPEAL THE DENIAL. THE NUMBER ON THE PAPERWORK IS INCORRECT. SPENT 30 MINS ON THE PHONE TRYING TO APPEAL THE DENIAL. RESUBMIT REQUEST FOR GIVENS CAPSULE ENDOSCOPY, DX: OBSCURE GI BLEED/MELENA.

## 2014-03-01 NOTE — Telephone Encounter (Signed)
I tried to call but I got no answer or VM

## 2014-03-04 ENCOUNTER — Telehealth: Payer: Self-pay | Admitting: Gastroenterology

## 2014-03-04 NOTE — Telephone Encounter (Signed)
Peer to Peer Appeal will need to go to [phone] Ext [phone] mon-fri  8:00 am to 5:00 pm Ref# [id number]

## 2014-03-04 NOTE — Discharge Instructions (Signed)
YOUR RECTAL BLEEDING IS DUE TO HEMORRHOIDS AND THE LARGE COLON POLYP. NO SOURCE FOR YOUR BLACK TARRY STOOLS WAS IDENTIFIED. YOUR UPPER ENDOSCOPY SHOWED MILD gastritis and A FEW gastric polyps. YOU HAVE CHANGES IN YOUR UPPER SMALL BOWEL MOST LIKELY DUE TO ADENOMATOUS CHANGES OF THE PANCREAS OPENING.  I biopsied your stomach.  YOU had 2 POLYPS REMOVED.  ONE WAS LARGE. I TATTOOED THE SITE & PLACED A METAL CLIP TO PREVENT BLEEDING IN 7-10 DAYS. You HAD internal hemorrhoids.  I PLACED 3 BANDS TO TREAT YOUR BLEEDING.    CALL [phone] FOR RECTAL BLEEDING/PAIN, FEVER OR DIFFICULTY URINATING.  Continue PROTONIX DAILY.  FOLLOW A LOW RESIDUE DIET FOR 2 WEEKS.  AVOID ITEMS THAT CAUSE BLOATING. SEE INFO BELOW.  YOU NEED TO COMPLETE A GIVENS CAPSULE STUDY NEXT WEEK.   YOU NEED AN ENDOSCOPIC ULTRASOUND WITHIN THE NEXT 2 WEEKS TO EVALUATE AND REMOVE THE POLYPS IN YOUR UPPER SMALL BOWEL.   YOUR BIOPSY WILL BE BACK IN 7 DAYS.  FOLLOW UP IN 3 WEEKS.  NO MRI FOR 30 DAYS.   ENDOSCOPY Care After Read the instructions outlined below and refer to this sheet in the next week. These discharge instructions provide you with general information on caring for yourself after you leave the hospital. While your treatment has been planned according to the most current medical practices available, unavoidable complications occasionally occur. If you have any problems or questions after discharge, call DR. Telena Peyser, [phone].  ACTIVITY  You may resume your regular activity, but move at a slower pace for the next 24 hours.   Take frequent rest periods for the next 24 hours.   Walking will help get rid of the air and reduce the bloated feeling in your belly (abdomen).   No driving for 24 hours (because of the medicine (anesthesia) used during the test).   You may shower.   Do not sign any important legal documents or operate any machinery for 24 hours (because of the anesthesia used during the test).     NUTRITION  Drink plenty of fluids.   You may resume your normal diet as instructed by your doctor.   Begin with a light meal and progress to your normal diet. Heavy or fried foods are harder to digest and may make you feel sick to your stomach (nauseated).   Avoid alcoholic beverages for 24 hours or as instructed.    MEDICATIONS  You may resume your normal medications.   WHAT YOU CAN EXPECT TODAY  Some feelings of bloating in the abdomen.   Passage of more gas than usual.   Spotting of blood in your stool or on the toilet paper  .  IF YOU HAD POLYPS REMOVED DURING THE ENDOSCOPY:  Eat a soft diet IF YOU HAVE NAUSEA, BLOATING, ABDOMINAL PAIN, OR VOMITING.    FINDING OUT THE RESULTS OF YOUR TEST Not all test results are available during your visit. DR. Oneida Alar WILL CALL YOU WITHIN 7 DAYS OF YOUR PROCEDUE WITH YOUR RESULTS. Do not assume everything is normal if you have not heard from DR. Susa Bones IN ONE WEEK, CALL HER OFFICE AT [phone].  SEEK IMMEDIATE MEDICAL ATTENTION AND CALL THE OFFICE: [phone] IF:  You have more than a spotting of blood in your stool.   Your belly is swollen (abdominal distention).   You are nauseated or vomiting.   You have a temperature over 101F.   You have abdominal pain or discomfort that is severe or gets worse throughout the day.  HEMORRHOIDAL BANDING COMPLICATIONS: ° °COMMON: °1. MINOR PAIN ° °UNCOMMON: °1. ABSCESS °2. BAND FALLS OFF °3. PROLAPSE OF HEMORRHOIDS AND PAIN °4. ULCER BLEEDING ° A. USUALLY SELF-LIMITED: MAY LAST 3-5 DAYS ° B. MAY REQUIRE INTERVENTION: 1-2 WEEKS AFTER INTERACTIONS °5. NECROTIZING PELVIC SEPSIS ° A. SYMPTOMS: FEVER, PAIN, DIFFICULTY URINATING ° ° °Low Fiber and Residue Restricted Diet °A low fiber diet restricts foods that contain carbohydrates that are not digested in the small intestine. A diet containing about 10 g of fiber is considered low fiber. The diet needs to be individualized to suit patient  tolerances and preferences and to avoid unnecessary restrictions. Generally, the foods emphasized in a low fiber diet have no skins or seeds. They may have been processed to remove bran, germ, or husks. Cooking may not necessarily eliminate the fiber. Cooking may, in fact, enable a greater quantity of fiber to be consumed in a lesser volume. Legumes and nuts are also restricted. °The term low residue has also been used to describe low fiber diets, although the two are not the same. Residue refers to any substance that adds to bowel (colonic) contents, such as sloughed cells and intestinal bacteria, in addition to fiber. Residue-containing foods, prunes and prune juice, milk, and connective tissue from meats may also need to be eliminated. It is important to eliminate these foods during sudden (acute) attacks of inflammatory bowel disease, when there is a partial obstruction due to another reason, or when minimal fecal output is desired. When these problems are gone, a more normal diet may be used. ° °PURPOSE °· Reduce stool weight and volume.  ° °CHOOSING FOODS °Check labels, especially on foods from the starch list. Often, dietary fiber content is listed with the Nutrition Facts panel.  °Breads and Starches °· Allowed: White, French, and pita breads, plain rolls, buns, or sweet rolls, doughnuts, waffles, pancakes, bagels. Plain muffins, sweet breads, biscuits, matzoth. Flour. Soda, saltine, or graham crackers. Pretzels, rusks, melba toast, zwieback. Cooked cereals: cornmeal, farina, cream cereals. Dry cereals: refined corn, wheat, rice, and oat cereals (check label). Potatoes prepared any way without skins, refined macaroni, spaghetti, noodles, refined rice.  °· Avoid: Bread, rolls, or crackers made with whole-wheat, multigrains, rye, bran seeds, nuts, or coconut. Corn tortillas, table-shells. Corn chips, tortilla chips. Cereals containing whole-grains, multigrains, bran, coconut, nuts, or raisins. Cooked or dry  oatmeal. Coarse wheat cereals, granola. Cereals advertised as "high fiber." Potato skins. Whole-grain pasta, wild or brown rice. Popcorn.  °Vegetables °· Allowed:  Strained tomato and vegetable juices. Fresh: tender lettuce, cucumber, cabbage, spinach, bean sprouts. Cooked, canned: asparagus, bean sprouts, cut green or wax beans, cauliflower, pumpkin, beets, mushrooms, olives, spinach, yellow squash, tomato, tomato sauce (no seeds), zucchini (peeled), turnips. Canned sweet potatoes. Small amounts of celery, onion, radish, and green pepper may be used. Keep servings limited to ½ cup.  °· Avoid: Fresh, cooked, or canned: artichokes, baked beans, beet greens, broccoli, Brussels sprouts, French-style green beans, corn, kale, legumes, peas, sweet potatoes. Cooked: green or red cabbage, spinach. Avoid large servings of any vegetables.  °Fruit °· Allowed:  All fruit juices except prune juice. Cooked or canned: apricots applesauce, cantaloupe, cherries, grapefruit, grapes, kiwi, mandarin oranges, peaches, pears, fruit cocktail, pineapple, plums, watermelon. Fresh: banana, grapes, cantaloupe, avocado, cherries, pineapple, grapefruit, kiwi, nectarines, peaches, oranges, blueberries, plums. Keep servings limited to ½ cup or 1 piece.  °· Avoid: Fresh: apple with or without skin, apricots, mango, pears, raspberries, strawberries. Prune juice, stewed or dried prunes. Dried fruits, raisins, dates.   Avoid large servings of all fresh fruits.  Meat and Meat Substitutes  Allowed:  Ground or well-cooked tender beef, ham, veal, lamb, pork, or poultry. Eggs, plain cheese. Fish, oysters, shrimp, lobster, other seafood. Liver, organ meats.   Avoid: Tough, fibrous meats with gristle. Peanut butter, smooth or chunky. Cheese with seeds, nuts, or other foods not allowed. Nuts, seeds, legumes, dried peas, beans, lentils.  Milk  Allowed:  All milk products except those not allowed. Milk and milk product consumption should be minimal when  low residue is desired.   Avoid: Yogurt that contains nuts or seeds.  Soups and Combination Foods  Allowed:  Bouillon, broth, or cream soups made from allowed foods. Any strained soup. Casseroles or mixed dishes made with allowed foods.   Avoid: Soups made from vegetables that are not allowed or that contain other foods not allowed.  Desserts and Sweets  Allowed:  Plain cakes and cookies, pie made with allowed fruit, pudding, custard, cream pie. Gelatin, fruit, ice, sherbet, frozen ice pops. Ice cream, ice milk without nuts. Plain hard candy, honey, jelly, molasses, syrup, sugar, chocolate syrup, gumdrops, marshmallows.   Avoid: Desserts, cookies, or candies that contain nuts, peanut butter, or dried fruits. Jams, preserves with seeds, marmalade.  Fats and Oils  Allowed:  Margarine, butter, cream, mayonnaise, salad oils, plain salad dressings made from allowed foods. Plain gravy, crisp bacon without rind.   Avoid: Seeds, nuts, olives. Avocados.  Beverages  Allowed:  All, except those listed to avoid.   Avoid: Fruit juices with high pulp, prune juice.  Condiments  Allowed:  Ketchup, mustard, horseradish, vinegar, cream sauce, cheese sauce, cocoa powder. Spices in moderation: allspice, basil, bay leaves, celery powder or leaves, cinnamon, cumin powder, curry powder, ginger, mace, marjoram, onion or garlic powder, oregano, paprika, parsley flakes, ground pepper, rosemary, sage, savory, tarragon, thyme, turmeric.   Avoid: Coconut, pickles.  SAMPLE MEAL PLAN The following menu is provided as a sample. Your daily menu plans will vary. Be sure to include a minimum of the following each day in order to provide essential nutrients for the adult:  Starch/Bread/Cereal Group, 6 servings.   Fruit/Vegetable Group, 5 servings.   Meat/Meat Substitute Group, 2 servings.   Milk/Milk Substitute Group, 2 servings.  A serving is equal to  cup for fruits, vegetables, and cooked cereals or 1 piece  for foods such as a piece of bread, 1 orange, or 1 apple. For dry cereals and crackers, use serving sizes listed on the label. Combination foods may count as full or partial servings from various food groups. Fats, desserts, and sweets may be added to the meal plan after the requirements for essential nutrients are met. SAMPLE MENU Breakfast   cup orange juice.   1 boiled egg.   1 slice white toast.   Margarine.    cup cornflakes.   1 cup milk.   Beverage.  Lunch   cup chicken noodle soup.   2 to 3 oz sliced roast beef.   2 slices seedless rye bread.   Mayonnaise.    cup tomato juice.   1 small banana.   Beverage.  Dinner  3 oz baked chicken.    cup scalloped potatoes.    cup cooked beets.   White dinner roll.   Margarine.    cup canned peaches.   Beverage.    Gastritis/DUODENITIS  Gastritis is an inflammation (the body's way of reacting to injury and/or infection) of the stomach. DUODENITIS is an inflammation (the body's way  of reacting to injury and/or infection) of the FIRST PART OF THE SMALL INTESTINES. It is often caused by bacterial (germ) infections. It can also be caused BY ASPIRIN, BC/GOODY POWDER'S, (IBUPROFEN) MOTRIN, OR ALEVE (NAPROXEN), chemicals (including alcohol), SPICY FOODS, and medications. This illness may be associated with generalized malaise (feeling tired, not well), UPPER ABDOMINAL STOMACH cramps, and fever. One common bacterial cause of gastritis is an organism known as H. Pylori. This can be treated with antibiotics.  ° ° ° ° ° °Hemorrhoids °Hemorrhoids are dilated (enlarged) veins around the rectum. Sometimes clots will form in the veins. This makes them swollen and painful. These are called thrombosed hemorrhoids. °Causes of hemorrhoids include: °· Constipation.  °· Straining to have a bowel movement. °·  HEAVY LIFTING °HOME CARE INSTRUCTIONS °· Eat a well balanced diet and drink 6 to 8 glasses of water every day to avoid  constipation. You may also use a bulk laxative.  °· Avoid straining to have bowel movements.  °· Keep anal area dry and clean.  °· Do not use a donut shaped pillow or sit on the toilet for long periods. This increases blood pooling and pain.  °· Move your bowels when your body has the urge; this will require less straining and will decrease pain and pressure.  ° °Polyps, Colon  °A polyp is extra tissue that grows inside your body. Colon polyps grow in the large intestine. The large intestine, also called the colon, is part of your digestive system. It is a long, hollow tube at the end of your digestive tract where your body makes and stores stool. °Most polyps are not dangerous. They are benign. This means they are not cancerous. But over time, some types of polyps can turn into cancer. Polyps that are smaller than a pea are usually not harmful. But larger polyps could someday become or may already be cancerous. To be safe, doctors remove all polyps and test them.  ° °WHO GETS POLYPS? °Anyone can get polyps, but certain people are more likely than others. You may have a greater chance of getting polyps if: °· You are over 50.  °· You have had polyps before.  °· Someone in your family has had polyps.  °· Someone in your family has had cancer of the large intestine.  °· Find out if someone in your family has had polyps. You may also be more likely to get polyps if you:  °· Eat a lot of fatty foods  · Smoke  · Drink alcohol  · Do not exercise · Eat too much  ° °TREATMENT °· The caregiver will remove the polyp during sigmoidoscopy or colonoscopy.  °PREVENTION °There is not one sure way to prevent polyps. You might be able to lower your risk of getting them if you: °· Eat more fruits and vegetables and less fatty food.  °· Do not smoke.  °· Avoid alcohol.  °· Exercise every day.  °· Lose weight if you are overweight.  °· Eating more calcium and folate can also lower your risk of getting polyps. Some foods that are rich  in calcium are milk, cheese, and broccoli. Some foods that are rich in folate are chickpeas, kidney beans, and spinach.  ° °

## 2014-03-04 NOTE — Telephone Encounter (Signed)
Tried resubmitting they are telling me that someone will contact Dr. Oneida Alar back to do Peer to Peer

## 2014-03-04 NOTE — Telephone Encounter (Addendum)
CALLED & LEFT VOICEMAIL FRI MAR 20 & MON MAR 23. AWAITING CALL BACK FROM [employer]. WILL RESUBMIT REQUEST TODAY.

## 2014-03-04 NOTE — Telephone Encounter (Signed)
PLEASE CALL PT. He had 2 simple adenomas removed. HE HAD BENIGN STOMACH POLYPS REMOVED. He needs to complete the endoscopic ultrasound. BLUE CROSS/BLUE SHEILD DENIED THE CAPSULE STUDY BUT I AM IN THE PROCESS OF APPEALING THE DENIAL.   Continue PROTONIX DAILY. FOLLOW A LOW RESIDUE DIET FOR 2 WEEKS.  AVOID ITEMS THAT CAUSE BLOATING.   FOLLOW UP IN 3 WEEKS E30 APR 8 OR 9  HEMORRHOIDS/ADENOMAS.  I NEED TO WAIT UNTIL THE ENDOSCOPIC ULTRASOUND IS COMPLETE TO LET YOU KNOW WHEN YOUR NEXT ONE WILL BE.   YOUR SISTERS, BROTHERS, AND A COLONOSCOPY WHEN THEY REACH THE AGE OF 18. YOUR PARENTS NEED TO HAVE A COLONOSCOPY.  NO MRI FOR 30 DAYS.

## 2014-03-05 NOTE — Telephone Encounter (Signed)
GIVENS cancelled at this time

## 2014-03-05 NOTE — Telephone Encounter (Signed)
Completed a peer to peer review regarding capsule study for patient. Spoke with Medical Director Dr. Jake Samples. Capsule study not approved currently. States right now since Hgb stable and no persistent evidence of occult GI bleed, would not cover. However, if anemia noted or several documented episodes of heme positive stool noted, would consider coverage. They also noted that if it was needed after EUS to further evaluate the small bowel, to submit again then.

## 2014-03-05 NOTE — Telephone Encounter (Signed)
I called and informed pt. He said his endoscopic ultra sound is scheduled for later in April.

## 2014-03-06 NOTE — Telephone Encounter (Addendum)
REVIEWED. PLEASE CALL PT. HIS INSURANCE COMPANY DENIED COVERAGE FOR THE CAPSULE ENDOSCOPY. HE WILL NEED TO WAIT UNTIL AFTER HIS EUS TO SEE IF THEY WILL PAY FOR IT.  AN ALTERNATIVE WOULD BE A SMALL BOWEL FOLLOW THROUGH, BUT WE WILL SEE WHAT THE INSURANCE CO APPROVES AFTER THE EUS.

## 2014-03-06 NOTE — Telephone Encounter (Signed)
Pt has OV with AS on 4/13 at 11

## 2014-03-07 NOTE — Telephone Encounter (Signed)
Called and informed pt's wife, Andee Poles.

## 2014-03-07 NOTE — Telephone Encounter (Signed)
Routing to DS 

## 2014-03-11 ENCOUNTER — Encounter (HOSPITAL_COMMUNITY): Admission: RE | Payer: Self-pay | Source: Ambulatory Visit

## 2014-03-11 ENCOUNTER — Ambulatory Visit (HOSPITAL_COMMUNITY)
Admission: RE | Admit: 2014-03-11 | Payer: BC Managed Care – PPO | Source: Ambulatory Visit | Admitting: Gastroenterology

## 2014-03-11 SURGERY — IMAGING PROCEDURE, GI TRACT, INTRALUMINAL, VIA CAPSULE

## 2014-03-13 ENCOUNTER — Encounter (HOSPITAL_COMMUNITY): Payer: Self-pay | Admitting: *Deleted

## 2014-03-18 ENCOUNTER — Other Ambulatory Visit: Payer: Self-pay | Admitting: Gastroenterology

## 2014-03-18 NOTE — Addendum Note (Signed)
Addended by: Natalio Salois on: 03/18/2014 10:35 AM   Modules accepted: Orders  

## 2014-03-20 ENCOUNTER — Ambulatory Visit (HOSPITAL_COMMUNITY)
Admission: RE | Admit: 2014-03-20 | Discharge: 2014-03-20 | Disposition: A | Discharge status: Home / Self Care | Payer: BC Managed Care – PPO | Source: Ambulatory Visit | Attending: Gastroenterology | Admitting: Gastroenterology

## 2014-03-20 ENCOUNTER — Ambulatory Visit (HOSPITAL_COMMUNITY): Payer: BC Managed Care – PPO | Admitting: Anesthesiology

## 2014-03-20 ENCOUNTER — Encounter (HOSPITAL_COMMUNITY): Payer: Self-pay | Admitting: Certified Registered Nurse Anesthetist

## 2014-03-20 ENCOUNTER — Encounter (HOSPITAL_COMMUNITY): Payer: BC Managed Care – PPO | Admitting: Anesthesiology

## 2014-03-20 ENCOUNTER — Encounter (HOSPITAL_COMMUNITY)
Admission: RE | Disposition: A | Discharge status: Home / Self Care | Payer: Self-pay | Source: Ambulatory Visit | Attending: Gastroenterology

## 2014-03-20 DIAGNOSIS — K219 Gastro-esophageal reflux disease without esophagitis: Secondary | ICD-10-CM | POA: Insufficient documentation

## 2014-03-20 DIAGNOSIS — Z79899 Other long term (current) drug therapy: Secondary | ICD-10-CM | POA: Insufficient documentation

## 2014-03-20 DIAGNOSIS — M109 Gout, unspecified: Secondary | ICD-10-CM | POA: Insufficient documentation

## 2014-03-20 DIAGNOSIS — K2980 Duodenitis without bleeding: Secondary | ICD-10-CM | POA: Insufficient documentation

## 2014-03-20 DIAGNOSIS — Z800 Family history of malignant neoplasm of digestive organs: Secondary | ICD-10-CM | POA: Insufficient documentation

## 2014-03-20 HISTORY — PX: EUS: SHX5427

## 2014-03-20 HISTORY — DX: Personal history of urinary calculi: Z87.442

## 2014-03-20 SURGERY — ESOPHAGEAL ENDOSCOPIC ULTRASOUND (EUS) RADIAL
Anesthesia: Monitor Anesthesia Care

## 2014-03-20 MED ORDER — KETAMINE HCL 10 MG/ML IJ SOLN
INTRAMUSCULAR | Status: AC
  Filled 2014-03-20: qty 1

## 2014-03-20 MED ORDER — PROPOFOL 10 MG/ML IV BOLUS
INTRAVENOUS | Status: DC | PRN
  Administered 2014-03-20: 20 mg via INTRAVENOUS
  Administered 2014-03-20: 40 mg via INTRAVENOUS
  Administered 2014-03-20: 60 mg via INTRAVENOUS
  Administered 2014-03-20 (×3): 40 mg via INTRAVENOUS

## 2014-03-20 MED ORDER — PROPOFOL 10 MG/ML IV BOLUS
INTRAVENOUS | Status: AC
  Filled 2014-03-20: qty 20

## 2014-03-20 MED ORDER — KETAMINE HCL 10 MG/ML IJ SOLN
INTRAMUSCULAR | Status: DC | PRN
  Administered 2014-03-20 (×2): 20 mg via INTRAVENOUS

## 2014-03-20 MED ORDER — MIDAZOLAM HCL 5 MG/5ML IJ SOLN
INTRAMUSCULAR | Status: DC | PRN
  Administered 2014-03-20 (×2): 1 mg via INTRAVENOUS

## 2014-03-20 MED ORDER — BUTAMBEN-TETRACAINE-BENZOCAINE 2-2-14 % EX AERO
INHALATION_SPRAY | CUTANEOUS | Status: DC | PRN
  Administered 2014-03-20: 2 via TOPICAL

## 2014-03-20 MED ORDER — MIDAZOLAM HCL 2 MG/2ML IJ SOLN
INTRAMUSCULAR | Status: AC
  Filled 2014-03-20: qty 2

## 2014-03-20 MED ORDER — FENTANYL CITRATE 0.05 MG/ML IJ SOLN
INTRAMUSCULAR | Status: AC
  Filled 2014-03-20: qty 2

## 2014-03-20 MED ORDER — SODIUM CHLORIDE 0.9 % IV SOLN: INTRAVENOUS | Status: DC

## 2014-03-20 MED ORDER — FENTANYL CITRATE 0.05 MG/ML IJ SOLN
INTRAMUSCULAR | Status: DC | PRN
  Administered 2014-03-20 (×2): 50 ug via INTRAVENOUS

## 2014-03-20 MED ORDER — LACTATED RINGERS IV SOLN
INTRAVENOUS | Status: DC
  Administered 2014-03-20: 1000 mL via INTRAVENOUS

## 2014-03-20 NOTE — Anesthesia Preprocedure Evaluation (Signed)
Anesthesia Evaluation  Patient identified by MRN, date of birth, ID band Patient awake    Reviewed: Allergy & Precautions, H&P , NPO status , Patient's Chart, lab work & pertinent test results  Airway Mallampati: II TM Distance: >3 FB Neck ROM: full    Dental no notable dental hx. (+) Teeth Intact, Dental Advisory Given   Pulmonary neg pulmonary ROS,  breath sounds clear to auscultation  Pulmonary exam normal       Cardiovascular Exercise Tolerance: Good negative cardio ROS  Rhythm:regular Rate:Normal     Neuro/Psych negative neurological ROS  negative psych ROS   GI/Hepatic negative GI ROS, Neg liver ROS, GERD-  Medicated and Controlled,  Endo/Other  negative endocrine ROS  Renal/GU negative Renal ROS  negative genitourinary   Musculoskeletal   Abdominal   Peds  Hematology negative hematology ROS (+)   Anesthesia Other Findings   Reproductive/Obstetrics negative OB ROS                           Anesthesia Physical Anesthesia Plan  ASA: II  Anesthesia Plan: MAC   Post-op Pain Management:    Induction:   Airway Management Planned:   Additional Equipment:   Intra-op Plan:   Post-operative Plan:   Informed Consent: I have reviewed the patients History and Physical, chart, labs and discussed the procedure including the risks, benefits and alternatives for the proposed anesthesia with the patient or authorized representative who has indicated his/her understanding and acceptance.   Dental Advisory Given  Plan Discussed with: CRNA and Surgeon  Anesthesia Plan Comments:         Anesthesia Quick Evaluation

## 2014-03-20 NOTE — Discharge Instructions (Signed)
Endoscopy/Endoscopic ultrasound  Care After Please read the instructions outlined below and refer to this sheet in the next few weeks. These discharge instructions provide you with general information on caring for yourself after you leave the hospital. Your doctor may also give you specific instructions. While your treatment has been planned according to the most current medical practices available, unavoidable complications occasionally occur. If you have any problems or questions after discharge, please call Dr. Paulita Fujita [facility]) at [phone].  HOME CARE INSTRUCTIONS Activity  You may resume your regular activity but move at a slower pace for the next 24 hours.   Take frequent rest periods for the next 24 hours.   Walking will help expel (get rid of) the air and reduce the bloated feeling in your abdomen.   No driving for 24 hours (because of the anesthesia (medicine) used during the test).   You may shower.   Do not sign any important legal documents or operate any machinery for 24 hours (because of the anesthesia used during the test).  Nutrition  Drink plenty of fluids.   You may resume your normal diet.   Begin with a light meal and progress to your normal diet.   Avoid alcoholic beverages for 24 hours or as instructed by your caregiver.  Medications You may resume your normal medications unless your caregiver tells you otherwise. What you can expect today  You may experience abdominal discomfort such as a feeling of fullness or "gas" pains.   You may experience a sore throat for 2 to 3 days. This is normal. Gargling with salt water may help this.    SEEK IMMEDIATE MEDICAL CARE IF:  You have excessive nausea (feeling sick to your stomach) and/or vomiting.   You have severe abdominal pain and distention (swelling).   You have trouble swallowing.   You have a temperature over 100 F (37.8 C).   You have rectal bleeding or vomiting of blood.   Document Released: 07/13/2004 Document Revised: 08/11/2011 Document Reviewed: 01/24/2008 [facility] Patient Information 2012 [place].

## 2014-03-20 NOTE — Anesthesia Postprocedure Evaluation (Signed)
  Anesthesia Post-op Note  Patient: [name]  Procedure(s) Performed: Procedure(s) (LRB): ESOPHAGEAL ENDOSCOPIC ULTRASOUND (EUS) RADIAL (N/A)  Patient Location: PACU  Anesthesia Type: MAC  Level of Consciousness: awake and alert   Airway and Oxygen Therapy: Patient Spontanous Breathing  Post-op Pain: mild  Post-op Assessment: Post-op Vital signs reviewed, Patient's Cardiovascular Status Stable, Respiratory Function Stable, Patent Airway and No signs of Nausea or vomiting  Last Vitals:  Filed Vitals:   03/20/14 1130  BP: 132/77  Pulse:   Temp:   Resp: 12    Post-op Vital Signs: stable   Complications: No apparent anesthesia complications

## 2014-03-20 NOTE — Interval H&P Note (Signed)
History and Physical Interval Note:  03/20/2014 10:10 AM  [name]  has presented today for surgery, with the diagnosis of ampulary lesions  The various methods of treatment have been discussed with the patient and family. After consideration of risks, benefits and other options for treatment, the patient has consented to  Procedure(s): ESOPHAGEAL ENDOSCOPIC ULTRASOUND (EUS) RADIAL (N/A) as a surgical intervention .  The patient's history has been reviewed, patient examined, no change in status, stable for surgery.  I have reviewed the patient's chart and labs.  Questions were answered to the patient's satisfaction.     Arta Silence  Assessment:  1.  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  Plan:  1.  Endoscopy with side-viewing scope, with mucosal biopsies, followed by endoscopic ultrasound. 2.  Risks (bleeding, infection, bowel perforation that could require surgery, sedation-related changes in cardiopulmonary systems), benefits (identification and possible treatment of source of symptoms, exclusion of certain causes of symptoms), and alternatives (watchful waiting, radiographic imaging studies, empiric medical treatment) of upper endoscopy with ampullary biopsies as well as upper endoscopic ultrasound (EGD with biopsies, EUS) were explained to patient/family in detail and patient wishes to proceed.  Patient is aware of small, but not negligible, risk of pancreatitis post-biopsy of ampulla.  However, I've explained to patient and his wife that I won't know how to proceed with management until we get biopsies of the area.

## 2014-03-20 NOTE — Op Note (Signed)
[facility] [place], [address]   ENDOSCOPIC ULTRASOUND PROCEDURE REPORT  PATIENT: [name], [name]  MR#: [id number] BIRTHDATE: 07/11/1986  GENDER: Male ENDOSCOPIST: Arta Silence, MD REFERRED BY:  Barney Drain, M.D. PROCEDURE DATE:  03/20/2014 PROCEDURE:   EGD with biopsies; Upper EUS ASA CLASS:      Class II INDICATIONS:   1.  ampullary lesion. MEDICATIONS: MAC sedation, administered by CRNA and Cetacaine spray x 2  DESCRIPTION OF PROCEDURE:   After the risks benefits and alternatives of the procedure were  explained, informed consent was obtained. The patient was then placed in the left, lateral, decubitus postion and IV sedation was administered. Throughout the procedure, the patients blood pressure, pulse and oxygen saturations were monitored continuously.  Under direct visualization, the     endoscope was introduced through the mouth and advanced to the second portion of the duodenum .  Water was used as necessary to provide an acoustic interface.  Upon completion of the imaging, water was removed and the patient was sent to the recovery room in satisfactory condition.    FINDINGS:      Side-viewing duodenoscope first used.  Prominent major papilla noted, but uncertain whether this is anatomic variant or represents adenomatous mucosa; mucosal biopsies taken (with care to avoid pancreatic ductal orifice) with cold forceps.  Minor papilla was much less prominent and didn't have clear adenomatous or mass-like appearance.  Subsequently, radial echoendoscope used; water instilled into descending duodenum to facilitate acoustic coupling; the ampulla appears prominent, about 10 x 12 mm in size, no obvious mass-like appearance, no invasion into pancreas or neighboring structures.  Some benign-appearing periportal lymph nodes.  Head and uncinate pancreas normal.  No biliary or pancreatic ductal dilatation.  IMPRESSION:     As  above.  RECOMMENDATIONS:     1.  Watch for potential complications of procedure. 2.  Await biopsy results. 3.  If biopsy results show no adenomatous change, would consider repeat endoscopy in 1-2 years for surveillance; if biopsies show adenomatous change, would consider tertiary center evaluation for possible endoscopic ampullectomy. 4.  Will discuss with Dr. Oneida Alar.   _______________________________ Lorrin MaisArta Silence, MD 03/20/2014 10:49 AM   CC:

## 2014-03-20 NOTE — H&P (View-Only) (Signed)
  Primary Care Physician:  Dustin Battiest, MD Primary Gastroenterologist:  Dr. Oneida Thomas  Pre-Procedure History & Physical: HPI:  [name] is a 28 y.o. male here for MELENA3-4X/WEEK/BRBPR-1X/WEEK.  Past Medical History  Diagnosis Date  . Chronic kidney disease     kidney stones  . Arthritis   . Gout   . Elevated LFTs     mildly elevated transaminases in past, now normalized     Past Surgical History  Procedure Laterality Date  . Kidney stone surgery      lithotripsy, stent  . Wisdom tooth extraction      Prior to Admission medications   Medication Sig Start Date End Date Taking? Authorizing Provider  allopurinol (ZYLOPRIM) 300 MG tablet Take 300 mg by mouth daily. 10/18/13  Yes Mikey Kirschner, MD  indomethacin (INDOCIN) 50 MG capsule Take 50 mg by mouth 4 (four) times daily as needed for mild pain. 10/18/13  Yes Mikey Kirschner, MD  pantoprazole (PROTONIX) 40 MG tablet Take 1 tablet (40 mg total) by mouth daily. For acid reflux 01/21/14  Yes Nilda Simmer, NP  polyethylene glycol-electrolytes (TRILYTE) 420 G solution Take 4,000 mLs by mouth as directed. 02/19/14   Danie Binder, MD    Allergies as of 02/19/2014  . (No Known Allergies)    Family History  Problem Relation Age of Onset  . Diabetes Father   . COPD Father   . Colon cancer Neg Hx     does not know paternal side    History   Social History  . Marital Status: Married    Spouse Name: N/A    Number of Children: N/A  . Years of Education: N/A   Occupational History  . Construction    Social History Main Topics  . Smoking status: Never Smoker   . Smokeless tobacco: Not on file  . Alcohol Use: No  . Drug Use: No  . Sexual Activity: Not on file   Other Topics Concern  . Not on file   Social History Narrative  . No narrative on file    Review of Systems: See HPI, otherwise negative ROS   Physical Exam: BP 126/82  Pulse 70  Temp(Src) 98 F (36.7 C) (Oral)  Resp 18  SpO2 100% General:    Alert,  pleasant and cooperative in NAD Head:  Normocephalic and atraumatic. Neck:  Supple; Lungs:  Clear throughout to auscultation.    Heart:  Regular rate and rhythm. Abdomen:  Soft, nontender and nondistended. Normal bowel sounds, without guarding, and without rebound.   Neurologic:  Alert and  oriented x4;  grossly normal neurologically.  Impression/Plan:    MELENA/BRBPR  PLAN: 1. EGD/TCS/?HEMORRHOID BANDING TODAY

## 2014-03-20 NOTE — Transfer of Care (Signed)
Immediate Anesthesia Transfer of Care Note  Patient: [name]  Procedure(s) Performed: Procedure(s): ESOPHAGEAL ENDOSCOPIC ULTRASOUND (EUS) RADIAL (N/A)  Patient Location: PACU and Endoscopy Unit  Anesthesia Type:MAC  Level of Consciousness: awake, oriented, patient cooperative, lethargic and responds to stimulation  Airway & Oxygen Therapy: Patient Spontanous Breathing and Patient connected to nasal cannula oxygen  Post-op Assessment: Report given to PACU RN, Post -op Vital signs reviewed and stable and Patient moving all extremities  Post vital signs: Reviewed and stable  Complications: No apparent anesthesia complications

## 2014-03-25 ENCOUNTER — Ambulatory Visit (INDEPENDENT_AMBULATORY_CARE_PROVIDER_SITE_OTHER): Payer: BC Managed Care – PPO | Admitting: Gastroenterology

## 2014-03-25 ENCOUNTER — Other Ambulatory Visit: Payer: Self-pay

## 2014-03-25 ENCOUNTER — Encounter: Payer: Self-pay | Admitting: Gastroenterology

## 2014-03-25 VITALS — BP 140/83 | HR 66 | Temp 98.4°F | Ht 70.0 in | Wt 241.0 lb

## 2014-03-25 DIAGNOSIS — K625 Hemorrhage of anus and rectum: Secondary | ICD-10-CM

## 2014-03-25 DIAGNOSIS — D649 Anemia, unspecified: Secondary | ICD-10-CM

## 2014-03-25 DIAGNOSIS — K219 Gastro-esophageal reflux disease without esophagitis: Secondary | ICD-10-CM

## 2014-03-25 DIAGNOSIS — K921 Melena: Secondary | ICD-10-CM

## 2014-03-25 NOTE — Patient Instructions (Signed)
I would like to check your blood count again in about 4 weeks.   If you see any further black, tarry stools, call our office immediately.   You will have a repeat endoscopic ultrasound in 1 year. We have put a reminder in our system for this.

## 2014-03-25 NOTE — Assessment & Plan Note (Signed)
Unclear if true melena in past, but patient now denies any further evidence of black, tarry stool. Will repeat Hgb in 4 weeks. Capsule study denied by insurance. If any evidence of recurrent melena, drop in Hgb, heme positive stool, insurance company may consider approving. Will monitor for now.

## 2014-03-25 NOTE — Progress Notes (Signed)
Referring Provider: Mikey Kirschner, MD Primary Care Physician:  Rubbie Battiest, MD Primary GI: Dr. Oneida Alar   Chief Complaint  Patient presents with  . Follow-up    HPI:   [name] presents today in follow-up after EGD, colonoscopy, and EUS. Findings of multiple gastric polyps on EGD that were benign. Question of major and minor papilla duodenal adenomas with EUS demonstrating prominence of major and minor papilla without clear adenomatous change. Path showed chronic duodenitis. Repeat evaluation recommended in 1-2 years. No further melena. Protonix for GERD. Doing well. No rectal bleeding. No concerns today.   Capsule study not approved by insurance.   Past Medical History  Diagnosis Date  . Arthritis   . Gout   . Elevated LFTs     mildly elevated transaminases in past, now normalized   . History of kidney stones     Past Surgical History  Procedure Laterality Date  . Kidney stone surgery  age 7 and age 26    lithotripsy, stent  . Wisdom tooth extraction    . Colonoscopy with esophagogastroduodenoscopy (egd) N/A 02/25/2014    Dr. Oneida Alar: normal TI, one small polyp and one large polyp removed from sigmoid colon, large internal hemorrhoids. Path with tubular adenoma. EGD: multiple benign gastric fundic gland polyps, no H.pylori. Question of duodenal adenomas involving major and minor papilla   . Eus N/A 03/20/2014    Dr. Paulita Fujita: prominent major papilla s/p biopsy, minor papilla without clear adenomatous or mass-like appearance, chronic duodenitis on path.     Current Outpatient Prescriptions  Medication Sig Dispense Refill  . allopurinol (ZYLOPRIM) 300 MG tablet Take 300 mg by mouth daily.      . indomethacin (INDOCIN) 50 MG capsule Take 50 mg by mouth 4 (four) times daily as needed for mild pain.      . pantoprazole (PROTONIX) 40 MG tablet Take 1 tablet (40 mg total) by mouth daily. For acid reflux  30 tablet  2   No current facility-administered medications for  this visit.    Allergies as of 03/25/2014 - Review Complete 03/25/2014  Allergen Reaction Noted  . Phenergan [promethazine hcl] Other (See Comments) 03/13/2014    Family History  Problem Relation Age of Onset  . Diabetes Father   . COPD Father   . Colon cancer Neg Hx     does not know paternal side    History   Social History  . Marital Status: Married    Spouse Name: N/A    Number of Children: N/A  . Years of Education: N/A   Occupational History  . Construction    Social History Main Topics  . Smoking status: Never Smoker   . Smokeless tobacco: Never Used  . Alcohol Use: No  . Drug Use: No  . Sexual Activity: None   Other Topics Concern  . None   Social History Narrative  . None    Review of Systems: As mentioned in HPI  Physical Exam: BP 140/83  Pulse 66  Temp(Src) 98.4 F (36.9 C) (Oral)  Ht 5\' 10"  (1.778 m)  Wt 241 lb (109.317 kg)  BMI 34.58 kg/m2 General:   Alert and oriented. No distress noted. Pleasant and cooperative.  Head:  Normocephalic and atraumatic. Eyes:  Conjuctiva clear without scleral icterus. Mouth:  Oral mucosa pink and moist. Good dentition. No lesions. Abdomen:  +BS, soft, non-tender and non-distended. No rebound or guarding. No HSM or masses noted. Msk:  Symmetrical without gross  deformities. Normal posture. Extremities:  Without edema. Neurologic:  Alert and  oriented x4;  grossly normal neurologically. Skin:  Intact without significant lesions or rashes. Psych:  Alert and cooperative. Normal mood and affect.  Lab Results  Component Value Date   WBC 7.8 01/21/2014   HGB 15.3 01/21/2014   HCT 44.2 01/21/2014   MCV 87.4 01/21/2014   PLT 283 01/21/2014

## 2014-03-25 NOTE — Assessment & Plan Note (Signed)
Controlled with Protonix. EGD revealed multiple fundic gland polyps and concern for duodenal adenomas; EUS performed with benign biopsies of questionable areas involving major and minor papilla. Needs repeat evaluation in 1 year with Dr. Paulita Fujita. I have asked our office to place him on the recall list.   Continue Protonix once daily.

## 2014-03-25 NOTE — Assessment & Plan Note (Signed)
Resolved. Secondary to internal hemorrhoids. Tubular adenomas in sigmoid colon, one large. Will investigate timing of surveillance. If further hematochezia, set up for banding.

## 2014-03-26 NOTE — Progress Notes (Signed)
cc'd to pcp 

## 2014-04-19 ENCOUNTER — Telehealth: Payer: Self-pay | Admitting: Gastroenterology

## 2014-04-19 DIAGNOSIS — R197 Diarrhea, unspecified: Secondary | ICD-10-CM

## 2014-04-19 NOTE — Progress Notes (Signed)
REVIEWED.  

## 2014-04-19 NOTE — Telephone Encounter (Signed)
PLEASE CALL PT. HE SHOULD SUBMIT STOOL STUDY FOR ROUTINE STOOL CULTURE AND C DIFF. HE SHOULD USE KAOPECTATE AS NEEDED FOR DIARRHEA. DRINK WATER TO KEEP URINE LIGHT YELLOW.  AVOID DAIRY PRODUCTS FOR 7 DAYS. CALL ON MON IF DIARRHEA NOT IMPROVED.

## 2014-04-19 NOTE — Telephone Encounter (Signed)
Pt's wife is aware for him to go by and pick up stool container from the lab and return them asap before using the kaopectate. If the diarrhea is not better by Monday for him to call us.

## 2014-04-19 NOTE — Telephone Encounter (Signed)
Pt has had diarrhea for a week and would like to know what we could recommend. He uses [facility] in [place]. [phone]

## 2014-04-22 ENCOUNTER — Other Ambulatory Visit: Payer: Self-pay | Admitting: Nurse Practitioner

## 2014-04-22 NOTE — Telephone Encounter (Signed)
REVIEWED.  

## 2014-04-22 NOTE — Telephone Encounter (Signed)
Per Ginger, Almyra Free faxed orders for the stool tests.

## 2014-05-27 ENCOUNTER — Other Ambulatory Visit: Payer: Self-pay | Admitting: Family Medicine

## 2014-06-18 ENCOUNTER — Ambulatory Visit (INDEPENDENT_AMBULATORY_CARE_PROVIDER_SITE_OTHER): Payer: BC Managed Care – PPO | Admitting: Family Medicine

## 2014-06-18 ENCOUNTER — Encounter: Payer: Self-pay | Admitting: Family Medicine

## 2014-06-18 VITALS — BP 130/94 | Ht 71.0 in | Wt 246.0 lb

## 2014-06-18 DIAGNOSIS — R7989 Other specified abnormal findings of blood chemistry: Secondary | ICD-10-CM

## 2014-06-18 DIAGNOSIS — K7689 Other specified diseases of liver: Secondary | ICD-10-CM

## 2014-06-18 DIAGNOSIS — M109 Gout, unspecified: Secondary | ICD-10-CM

## 2014-06-18 DIAGNOSIS — R945 Abnormal results of liver function studies: Secondary | ICD-10-CM

## 2014-06-18 DIAGNOSIS — K219 Gastro-esophageal reflux disease without esophagitis: Secondary | ICD-10-CM

## 2014-06-18 DIAGNOSIS — K760 Fatty (change of) liver, not elsewhere classified: Secondary | ICD-10-CM

## 2014-06-18 DIAGNOSIS — M10421 Other secondary gout, right elbow: Secondary | ICD-10-CM

## 2014-06-18 DIAGNOSIS — E785 Hyperlipidemia, unspecified: Secondary | ICD-10-CM

## 2014-06-18 MED ORDER — PANTOPRAZOLE SODIUM 40 MG PO TBEC: 40.0000 mg | DELAYED_RELEASE_TABLET | Freq: Every day | ORAL | Status: DC

## 2014-06-18 MED ORDER — ALLOPURINOL 300 MG PO TABS: 300.0000 mg | ORAL_TABLET | Freq: Every day | ORAL | Status: DC

## 2014-06-18 NOTE — Progress Notes (Signed)
   Subjective:    Patient ID: [name], male    DOB: [birth year], 28 y.o.   MRN: [id number]  HPIMed check up. Needs refills protonix and allopurinol.   Gout in ankles and toe. Was out of gout med for 1 and a half weeks.   Pain in elbows and knees. Elbows and knees started hurting bad.no sig swelling.  Reflux has started acting up, substernal burning and discomfort  Pt notes sig headaches when wstarting protonix,  Overall busy with work and home, wide open pace  Gets a lot of exercise  Diet mostly good   Pt will be minding own business, gets a needle like sensation over the whole body. Runs all over, lasts for just a few seconds, Hits every other day, doesn't stop meds, aggravating   Review of Systems Mild headache for the past week. No chest pain no cough no fever no rash no abdominal pain no change in bowel habits no change in urinary habits ROS otherwise negative.    Objective:   Physical Exam Alert no apparent distress. HEENT normal. Blood pressure improved on repeat. Lungs clear. Heart regular rate and rhythm. Elbows knees good range of motion no erythema no warmth no swelling. Epigastrium nontender liver nonpalpable       Assessment & Plan:  Impression #1 fatty liver with elevated liver enzymes discussed #2 reflux ongoing. Discussed. #3 gout. Recurred when off allopurinol. #4 nonspecific paresthesias highly doubt serious etiology. Plan patient declines neurology referral. Appropriate blood work. Medications refilled. Diet exercise discussed. Further recommendations based on results. WSL

## 2014-10-31 ENCOUNTER — Encounter: Payer: Self-pay | Admitting: Nurse Practitioner

## 2014-10-31 ENCOUNTER — Ambulatory Visit (INDEPENDENT_AMBULATORY_CARE_PROVIDER_SITE_OTHER): Payer: BC Managed Care – PPO | Admitting: Nurse Practitioner

## 2014-10-31 VITALS — BP 122/74 | Temp 98.3°F | Ht 71.0 in | Wt 248.2 lb

## 2014-10-31 DIAGNOSIS — K2100 Gastro-esophageal reflux disease with esophagitis, without bleeding: Secondary | ICD-10-CM

## 2014-10-31 DIAGNOSIS — Z13220 Encounter for screening for lipoid disorders: Secondary | ICD-10-CM

## 2014-10-31 DIAGNOSIS — M1000 Idiopathic gout, unspecified site: Secondary | ICD-10-CM

## 2014-10-31 DIAGNOSIS — R1011 Right upper quadrant pain: Secondary | ICD-10-CM

## 2014-10-31 DIAGNOSIS — K760 Fatty (change of) liver, not elsewhere classified: Secondary | ICD-10-CM

## 2014-10-31 DIAGNOSIS — R748 Abnormal levels of other serum enzymes: Secondary | ICD-10-CM

## 2014-10-31 MED ORDER — PREDNISONE 20 MG PO TABS: ORAL_TABLET | ORAL | Status: DC

## 2014-10-31 MED ORDER — PANTOPRAZOLE SODIUM 40 MG PO TBEC: 40.0000 mg | DELAYED_RELEASE_TABLET | Freq: Two times a day (BID) | ORAL | Status: DC

## 2014-11-01 LAB — HEPATIC FUNCTION PANEL
ALBUMIN: 4.7 g/dL (ref 3.5–5.2)
ALT: 52 U/L (ref 0–53)
AST: 27 U/L (ref 0–37)
Alkaline Phosphatase: 63 U/L (ref 39–117)
BILIRUBIN INDIRECT: 0.5 mg/dL (ref 0.2–1.2)
Bilirubin, Direct: 0.1 mg/dL (ref 0.0–0.3)
Total Bilirubin: 0.6 mg/dL (ref 0.2–1.2)
Total Protein: 7.5 g/dL (ref 6.0–8.3)

## 2014-11-01 LAB — BASIC METABOLIC PANEL
BUN: 12 mg/dL (ref 6–23)
CHLORIDE: 104 meq/L (ref 96–112)
CO2: 28 mEq/L (ref 19–32)
CREATININE: 1.02 mg/dL (ref 0.50–1.35)
Calcium: 9.3 mg/dL (ref 8.4–10.5)
Glucose, Bld: 77 mg/dL (ref 70–99)
POTASSIUM: 4 meq/L (ref 3.5–5.3)
Sodium: 140 mEq/L (ref 135–145)

## 2014-11-01 LAB — CBC WITH DIFFERENTIAL/PLATELET
Basophils Absolute: 0.1 10*3/uL (ref 0.0–0.1)
Basophils Relative: 1 % (ref 0–1)
EOS PCT: 3 % (ref 0–5)
Eosinophils Absolute: 0.2 10*3/uL (ref 0.0–0.7)
HCT: 46.2 % (ref 39.0–52.0)
HEMOGLOBIN: 15.8 g/dL (ref 13.0–17.0)
Lymphocytes Relative: 40 % (ref 12–46)
Lymphs Abs: 2.6 10*3/uL (ref 0.7–4.0)
MCH: 30.4 pg (ref 26.0–34.0)
MCHC: 34.2 g/dL (ref 30.0–36.0)
MCV: 89 fL (ref 78.0–100.0)
MPV: 9 fL — AB (ref 9.4–12.4)
Monocytes Absolute: 0.5 10*3/uL (ref 0.1–1.0)
Monocytes Relative: 7 % (ref 3–12)
Neutro Abs: 3.2 10*3/uL (ref 1.7–7.7)
Neutrophils Relative %: 49 % (ref 43–77)
Platelets: 249 10*3/uL (ref 150–400)
RBC: 5.19 MIL/uL (ref 4.22–5.81)
RDW: 13.3 % (ref 11.5–15.5)
WBC: 6.5 10*3/uL (ref 4.0–10.5)

## 2014-11-01 LAB — LIPID PANEL
CHOLESTEROL: 164 mg/dL (ref 0–200)
HDL: 32 mg/dL — AB (ref >39)
LDL CALC: 112 mg/dL — AB (ref 0–99)
Total CHOL/HDL Ratio: 5.1 Ratio
Triglycerides: 101 mg/dL (ref <150)
VLDL: 20 mg/dL (ref 0–40)

## 2014-11-01 LAB — URIC ACID: URIC ACID, SERUM: 5.8 mg/dL (ref 4.0–7.8)

## 2014-11-01 LAB — LIPASE: Lipase: 15 U/L (ref 0–75)

## 2014-11-04 ENCOUNTER — Encounter: Payer: Self-pay | Admitting: Nurse Practitioner

## 2014-11-04 NOTE — Progress Notes (Signed)
Subjective:  Presents for complaints of gout exacerbation. This is the first flareup he has had in a while. Began in the left knee area, now involving several joints. No fever. Began about a month ago. Tried indomethacin, describes a feeling of "needles", has not used this since. Having reflux symptoms about once a week with acid and regurgitation. Taking Protonix daily. Occasional mid back pain. No right upper quadrant pain. No nausea vomiting. No fevers. No change in his diet. Admits to increased stress. No diarrhea constipation or blood in his stools. Stools normal color. Had an EGD in April, due for repeat 2016.   Objective:   BP 122/74 mmHg  Temp(Src) 98.3 F (36.8 C) (Oral)  Ht 5\' 11"  (1.803 m)  Wt 248 lb 4 oz (112.605 kg)  BMI 34.64 kg/m2 NAD. Alert, oriented. Lungs clear. Heart regular rate rhythm. Abdomen soft nondistended nontender. No erythema or edema of the hands. No specific joint involvement noted today.  Assessment:  Problem List Items Addressed This Visit      Digestive   GERD (gastroesophageal reflux disease) - Primary   Relevant Medications      pantoprazole (PROTONIX) EC tablet   Fatty liver     Other   Gout   Relevant Orders      Uric acid (Completed)   Elevated liver enzymes   Relevant Orders      Hepatic function panel (Completed)    Other Visit Diagnoses    RUQ abdominal pain        Relevant Orders       CBC with Differential (Completed)       Basic metabolic panel (Completed)       Lipase (Completed)    Screening for lipid disorders        Relevant Orders       Lipid panel (Completed)      Plan:  Meds ordered this encounter  Medications  . pantoprazole (PROTONIX) 40 MG tablet    Sig: Take 1 tablet (40 mg total) by mouth 2 (two) times daily before a meal.    Dispense:  60 tablet    Refill:  11    Order Specific Question:  Supervising Provider    Answer:  Mikey Kirschner [[id number]]  . predniSONE (DELTASONE) 20 MG tablet    Sig: 3 po qd x 3 d then  2 po qd x 3 d then 1 po qd x 3 d    Dispense:  18 tablet    Refill:  0    Order Specific Question:  Supervising Provider    Answer:  Mikey Kirschner [[id number]]   Continue allopurinol daily. Further follow-up based on labs. Discussed importance of stress reduction. Reminded about lifestyle factors affecting his reflux symptoms. Call back in 10-14 days if no improvement, sooner if worse. Continue follow-up with GI specialist as planned.

## 2014-11-14 ENCOUNTER — Telehealth: Payer: Self-pay | Admitting: Family Medicine

## 2014-11-14 NOTE — Telephone Encounter (Signed)
Patient states he recently has been taking acid reflux mediation and prednisone together. Patient states that he finished the prednisone last Thursday and is now having bad abdominal cramping, 7-8 loose stools today and acid buildup in his mouth. This has been present for about 3-4 days. Is this side effects of the medication?

## 2014-11-14 NOTE — Telephone Encounter (Signed)
Patient was seen by Hoyle Sauer on 11/19 and prescribe prednisone 20mg  he finished it and now having side effects.He wants to speak with Hoyle Sauer to see if its due to the other medications he's on.

## 2014-11-15 ENCOUNTER — Telehealth: Payer: Self-pay | Admitting: Family Medicine

## 2014-11-15 NOTE — Telephone Encounter (Signed)
Please see message from 11/14/14 regarding a reaction to a medication.  Patient says he never heard back.

## 2014-11-15 NOTE — Telephone Encounter (Signed)
Notified patient usually prednisone does not cause loose stools, and that has not been a problem in past with protonix only. Likely separate issue with the diarrhea. Take otc immodium qid for diarrhea. increase reflux may be due to prednisone and should improve over the coming week. Patient verbalized understanding.

## 2014-11-15 NOTE — Telephone Encounter (Signed)
Usually pred does not cause loose stools, and that has no t been a prof in past with protonix only. Likely separate issue with the diarrhea. Take otc immodium qid for diarrhea. incr ref may be due to pred and should improve over the coming wk

## 2014-11-15 NOTE — Telephone Encounter (Signed)
Dr. Richardson Landry already answered this message.

## 2014-12-30 ENCOUNTER — Encounter: Payer: Self-pay | Admitting: Family Medicine

## 2014-12-30 ENCOUNTER — Ambulatory Visit (INDEPENDENT_AMBULATORY_CARE_PROVIDER_SITE_OTHER): Payer: BLUE CROSS/BLUE SHIELD | Admitting: Family Medicine

## 2014-12-30 VITALS — BP 112/80 | Temp 98.4°F | Ht 71.0 in | Wt 251.1 lb

## 2014-12-30 DIAGNOSIS — J310 Chronic rhinitis: Secondary | ICD-10-CM

## 2014-12-30 DIAGNOSIS — J329 Chronic sinusitis, unspecified: Secondary | ICD-10-CM

## 2014-12-30 MED ORDER — BENZONATATE 100 MG PO CAPS: 100.0000 mg | ORAL_CAPSULE | Freq: Four times a day (QID) | ORAL | Status: DC | PRN

## 2014-12-30 MED ORDER — LEVOFLOXACIN 500 MG PO TABS: 500.0000 mg | ORAL_TABLET | Freq: Every day | ORAL | Status: DC

## 2014-12-30 NOTE — Progress Notes (Signed)
   Subjective:    Patient ID: [name], male    DOB: 1986-11-06, 29 y.o.   MRN: [id number]  Sinusitis This is a new problem. The current episode started in the past 7 days. The problem is unchanged. There has been no fever. The pain is moderate. Associated symptoms include congestion, coughing, ear pain, headaches, sinus pressure and a sore throat. Past treatments include oral decongestants. The treatment provided no relief.   Patient states that he has no other concerns at this time.   Got sick last Monday, feeling achey and feeling bad,  Viral achinesss and headache progressive  Now green and gunky  Pt does not smoke  Some exp to cod air   Taking ot c sinus and cong meds    Review of Systems  HENT: Positive for congestion, ear pain, sinus pressure and sore throat.   Respiratory: Positive for cough.   Neurological: Positive for headaches.       Objective:   Physical Exam Alert moderate malaise. Hydration decent. Vitals reviewed. HEENT moderate nasal congestion frontal tenderness pharynx normal neck supple. Lungs clear. Heart regular in rhythm.       Assessment & Plan:  Impression acute rhinosinusitis plan antibiotics prescribed. Symptomatic care discussed. Warning signs discussed. WSL

## 2015-01-15 ENCOUNTER — Ambulatory Visit (INDEPENDENT_AMBULATORY_CARE_PROVIDER_SITE_OTHER): Payer: BLUE CROSS/BLUE SHIELD | Admitting: Gastroenterology

## 2015-01-15 ENCOUNTER — Encounter: Payer: Self-pay | Admitting: Gastroenterology

## 2015-01-15 VITALS — BP 119/73 | HR 70 | Temp 98.9°F | Ht 71.0 in | Wt 249.2 lb

## 2015-01-15 DIAGNOSIS — R195 Other fecal abnormalities: Secondary | ICD-10-CM

## 2015-01-15 DIAGNOSIS — K625 Hemorrhage of anus and rectum: Secondary | ICD-10-CM

## 2015-01-15 MED ORDER — HYDROCORTISONE ACETATE 25 MG RE SUPP: 25.0000 mg | Freq: Two times a day (BID) | RECTAL | Status: DC

## 2015-01-15 NOTE — Progress Notes (Signed)
Referring Provider: Mikey Kirschner, MD Primary Care Physician:  Rubbie Battiest, MD  Primary GI: Dr. Oneida Alar    Chief Complaint  Patient presents with  . Blood In Stools    HPI:   [name] is a 29 y.o. male presenting today with a history of multiple gastric polyps on EGD last year that were benign. EUS demonstrated prominence of major and minor papilla without clear adenomatous change. Path with chronic duodenitis. Due for surveillance around April this year. Capsule study had been attempted for approval by insurance but was not approved (due to concern for history of melena).   Has to wake up and go to the bathroom, loose and watery stool for past few weeks. Rectal bleeding started a few weeks ago. No rectal discomfort. No abdominal pain. Prior to this would have a BM 3-5 times a day but soft. Levaquin Dec 30, 2014. Colonoscopy with large internal hemorrhoids March 2015.   Past Medical History  Diagnosis Date  . Arthritis   . Gout   . Elevated LFTs     mildly elevated transaminases in past, now normalized   . History of kidney stones     Past Surgical History  Procedure Laterality Date  . Kidney stone surgery  age 63 and age 57    lithotripsy, stent  . Wisdom tooth extraction    . Colonoscopy with esophagogastroduodenoscopy (egd) N/A 02/25/2014    Dr. Oneida Alar: normal TI, one small polyp and one large polyp removed from sigmoid colon, large internal hemorrhoids. Path with tubular adenoma. EGD: multiple benign gastric fundic gland polyps, no H.pylori. Question of duodenal adenomas involving major and minor papilla   . Eus N/A 03/20/2014    Dr. Paulita Fujita: prominent major papilla s/p biopsy, minor papilla without clear adenomatous or mass-like appearance, chronic duodenitis on path.     Current Outpatient Prescriptions  Medication Sig Dispense Refill  . allopurinol (ZYLOPRIM) 300 MG tablet Take 1 tablet (300 mg total) by mouth daily. 30 tablet 11  . indomethacin (INDOCIN) 50  MG capsule Take 50 mg by mouth 4 (four) times daily as needed for mild pain.    . pantoprazole (PROTONIX) 40 MG tablet Take 1 tablet (40 mg total) by mouth 2 (two) times daily before a meal. 60 tablet 11  . hydrocortisone (ANUSOL-HC) 25 MG suppository Place 1 suppository (25 mg total) rectally every 12 (twelve) hours. 12 suppository 1   No current facility-administered medications for this visit.    Allergies as of 01/15/2015 - Review Complete 01/15/2015  Allergen Reaction Noted  . Phenergan [promethazine hcl] Other (See Comments) 03/13/2014    Family History  Problem Relation Age of Onset  . Diabetes Father   . COPD Father   . Colon cancer Neg Hx     does not know paternal side    History   Social History  . Marital Status: Married    Spouse Name: N/A    Number of Children: N/A  . Years of Education: N/A   Occupational History  . Construction    Social History Main Topics  . Smoking status: Never Smoker   . Smokeless tobacco: Never Used  . Alcohol Use: No  . Drug Use: No  . Sexual Activity: None   Other Topics Concern  . None   Social History Narrative    Review of Systems: As mentioned in HPI.   Physical Exam: BP 119/73 mmHg  Pulse 70  Temp(Src) 98.9 F (37.2 C)  Ht 5'  11" (1.803 m)  Wt 249 lb 3.2 oz (113.036 kg)  BMI 34.77 kg/m2 General:   Alert and oriented. No distress noted. Pleasant and cooperative.  Head:  Normocephalic and atraumatic. Eyes:  Conjuctiva clear without scleral icterus. Mouth:  Oral mucosa pink and moist. Good dentition. No lesions. Heart:  S1, S2 present without murmurs, rubs, or gallops. Regular rate and rhythm. Abdomen:  +BS, soft, non-tender and non-distended. No rebound or guarding. No HSM or masses noted. Msk:  Symmetrical without gross deformities. Normal posture. Extremities:  Without edema. Neurologic:  Alert and  oriented x4;  grossly normal neurologically. Skin:  Intact without significant lesions or rashes. Psych:   Alert and cooperative. Normal mood and affect.

## 2015-01-15 NOTE — Patient Instructions (Signed)
Please complete the stool studies. We will call as soon as we know the results.   Start taking a probiotic daily such as Sadler, Dana, Digestive Advantage, [employer].   I have sent in suppositories to your pharmacy to take twice a day for 6 days. This is to calm down your internal hemorrhoids.

## 2015-01-20 DIAGNOSIS — R195 Other fecal abnormalities: Secondary | ICD-10-CM | POA: Insufficient documentation

## 2015-01-20 NOTE — Assessment & Plan Note (Signed)
29 year old male with low-volume hematochezia in the setting of loose stools and known internal hemorrhoids. Treat with Anusol suppositories and consider outpatient banding if no improvement. Further recommendations after stool studies completed.

## 2015-01-20 NOTE — Assessment & Plan Note (Signed)
New onset after antibiotic exposure. Check stool studies to include Cdiff.

## 2015-01-21 NOTE — Progress Notes (Signed)
cc'ed to pcp °

## 2015-01-27 HISTORY — PX: APPENDECTOMY: SHX54

## 2015-03-04 ENCOUNTER — Telehealth: Payer: Self-pay | Admitting: Gastroenterology

## 2015-03-04 NOTE — Telephone Encounter (Signed)
PATIENT ON April RECALL FOR 1 YR REPEAT EUS WITH DR Paulita Fujita

## 2015-03-04 NOTE — Telephone Encounter (Signed)
Spoke with Christina Dr. Lavetta Nielsen CMA and she is aware that pt needs a 1 year repeat EUS.  She states she will mail a pt a letter to contact them to arrange.  States for our office to call back in the middle of April to follow up and make sure pt has been scheduled.

## 2015-03-31 ENCOUNTER — Encounter: Payer: Self-pay | Admitting: Family Medicine

## 2015-03-31 ENCOUNTER — Ambulatory Visit (INDEPENDENT_AMBULATORY_CARE_PROVIDER_SITE_OTHER): Payer: BLUE CROSS/BLUE SHIELD | Admitting: Family Medicine

## 2015-03-31 VITALS — BP 120/82 | Temp 98.6°F | Ht 71.0 in | Wt 248.2 lb

## 2015-03-31 DIAGNOSIS — L03114 Cellulitis of left upper limb: Secondary | ICD-10-CM

## 2015-03-31 MED ORDER — AMOXICILLIN-POT CLAVULANATE 875-125 MG PO TABS: 1.0000 | ORAL_TABLET | Freq: Two times a day (BID) | ORAL | Status: AC

## 2015-03-31 NOTE — Progress Notes (Signed)
   Subjective:    Patient ID: [name], male    DOB: [birth year], 29 y.o.   MRN: [id number]  HPI Patient is here today for a possible spider bite on his left arm. Area is red, swollen and tender to touch. This has been present for about 2 days now. No drainage at this time.  No other concerns at this time.    No fever or chills. Next  On further history patient is not sure what got him. He thinks it is a bite. He is outdoors quite a bit. Review of Systems No headache no chest pain no back pain no loss of consciousness    Objective:   Physical Exam  Alert vitals stable HEENT normal. Lungs clear heart regular in rhythm. Left forearm erythematous patch small central papule very tender to palpation      Assessment & Plan:  Impression skin infection with early cellulitis plan antibiotics prescribed. Warning signs discussed. Going with Augmentin because of history of sun exposure if worsens call us. Multiple questions answered local measures discussed WSL

## 2015-04-03 NOTE — Telephone Encounter (Signed)
Received fax from Dr. Paulita Fujita. Placed copy in Dr. Oneida Alar office for her review.

## 2015-04-03 NOTE — Telephone Encounter (Signed)
Spoke with Margreta Journey and she is going to fax over path results and Dr. Lavetta Nielsen recommendations.

## 2015-04-03 NOTE — Telephone Encounter (Addendum)
REVIEWED-WO RECOMMENDED REPEAT ENDOSCOPY IN 1-2 YRS. REPEAT EGD IN 2016-dX: ABNORMAL/ENLARGED AMPULLA.

## 2015-04-04 ENCOUNTER — Encounter: Payer: Self-pay | Admitting: Gastroenterology

## 2015-04-04 NOTE — Progress Notes (Addendum)
REVIEWED. EGD IN 2016-PHENERGAN 25 MG IV IN PREOP. NEEDS NEXT TCS IN 2018.

## 2015-04-07 NOTE — Telephone Encounter (Signed)
Called pt. Unable to leave message on machine.

## 2015-04-09 ENCOUNTER — Other Ambulatory Visit: Payer: Self-pay

## 2015-04-09 DIAGNOSIS — K921 Melena: Secondary | ICD-10-CM

## 2015-04-09 DIAGNOSIS — D135 Benign neoplasm of extrahepatic bile ducts: Secondary | ICD-10-CM

## 2015-04-09 NOTE — Telephone Encounter (Signed)
Talked with patient and he will call back when he can talk.

## 2015-04-09 NOTE — Telephone Encounter (Signed)
Pt is set up for May 17 @ 12:45. He is aware and instructions are in the mail

## 2015-04-29 ENCOUNTER — Encounter (HOSPITAL_COMMUNITY): Payer: Self-pay | Admitting: *Deleted

## 2015-04-29 ENCOUNTER — Encounter (HOSPITAL_COMMUNITY)
Admission: RE | Disposition: A | Discharge status: Home / Self Care | Payer: Self-pay | Source: Ambulatory Visit | Attending: Gastroenterology

## 2015-04-29 ENCOUNTER — Ambulatory Visit (HOSPITAL_COMMUNITY)
Admission: RE | Admit: 2015-04-29 | Discharge: 2015-04-29 | Disposition: A | Discharge status: Home / Self Care | Payer: BLUE CROSS/BLUE SHIELD | Source: Ambulatory Visit | Attending: Gastroenterology | Admitting: Gastroenterology

## 2015-04-29 DIAGNOSIS — Z9049 Acquired absence of other specified parts of digestive tract: Secondary | ICD-10-CM | POA: Diagnosis not present

## 2015-04-29 DIAGNOSIS — M1990 Unspecified osteoarthritis, unspecified site: Secondary | ICD-10-CM | POA: Insufficient documentation

## 2015-04-29 DIAGNOSIS — Z888 Allergy status to other drugs, medicaments and biological substances status: Secondary | ICD-10-CM | POA: Diagnosis not present

## 2015-04-29 DIAGNOSIS — R933 Abnormal findings on diagnostic imaging of other parts of digestive tract: Secondary | ICD-10-CM | POA: Diagnosis not present

## 2015-04-29 DIAGNOSIS — K3189 Other diseases of stomach and duodenum: Secondary | ICD-10-CM | POA: Insufficient documentation

## 2015-04-29 DIAGNOSIS — K2970 Gastritis, unspecified, without bleeding: Secondary | ICD-10-CM | POA: Diagnosis not present

## 2015-04-29 DIAGNOSIS — M109 Gout, unspecified: Secondary | ICD-10-CM | POA: Insufficient documentation

## 2015-04-29 DIAGNOSIS — D135 Benign neoplasm of extrahepatic bile ducts: Secondary | ICD-10-CM

## 2015-04-29 DIAGNOSIS — K2950 Unspecified chronic gastritis without bleeding: Secondary | ICD-10-CM | POA: Insufficient documentation

## 2015-04-29 DIAGNOSIS — K2980 Duodenitis without bleeding: Secondary | ICD-10-CM | POA: Diagnosis present

## 2015-04-29 DIAGNOSIS — Z87442 Personal history of urinary calculi: Secondary | ICD-10-CM | POA: Insufficient documentation

## 2015-04-29 HISTORY — PX: ESOPHAGOGASTRODUODENOSCOPY: SHX5428

## 2015-04-29 SURGERY — EGD (ESOPHAGOGASTRODUODENOSCOPY)
Anesthesia: Moderate Sedation

## 2015-04-29 MED ORDER — MIDAZOLAM HCL 5 MG/5ML IJ SOLN
INTRAMUSCULAR | Status: AC
  Filled 2015-04-29: qty 10

## 2015-04-29 MED ORDER — ONDANSETRON HCL 4 MG/2ML IJ SOLN
INTRAMUSCULAR | Status: DC | PRN
Start: 2015-04-29 — End: 2015-04-29
  Administered 2015-04-29: 4 mg via INTRAVENOUS

## 2015-04-29 MED ORDER — ONDANSETRON HCL 4 MG/2ML IJ SOLN
INTRAMUSCULAR | Status: AC
  Filled 2015-04-29: qty 2

## 2015-04-29 MED ORDER — STERILE WATER FOR IRRIGATION IR SOLN
Status: DC | PRN
  Administered 2015-04-29: 13:00:00

## 2015-04-29 MED ORDER — SODIUM CHLORIDE 0.9 % IV SOLN
INTRAVENOUS | Status: DC
  Administered 2015-04-29: 1000 mL via INTRAVENOUS

## 2015-04-29 MED ORDER — MIDAZOLAM HCL 5 MG/5ML IJ SOLN
INTRAMUSCULAR | Status: DC | PRN
  Administered 2015-04-29: 2 mg via INTRAVENOUS
  Administered 2015-04-29: 1 mg via INTRAVENOUS
  Administered 2015-04-29 (×2): 2 mg via INTRAVENOUS
  Administered 2015-04-29: 1 mg via INTRAVENOUS

## 2015-04-29 MED ORDER — MEPERIDINE HCL 100 MG/ML IJ SOLN
INTRAMUSCULAR | Status: DC | PRN
  Administered 2015-04-29: 25 mg via INTRAVENOUS
  Administered 2015-04-29: 50 mg via INTRAVENOUS
  Administered 2015-04-29: 25 mg via INTRAVENOUS
  Administered 2015-04-29: 50 mg via INTRAVENOUS

## 2015-04-29 MED ORDER — LIDOCAINE VISCOUS 2 % MT SOLN
OROMUCOSAL | Status: DC | PRN
  Administered 2015-04-29: 4 mL via OROMUCOSAL

## 2015-04-29 MED ORDER — LIDOCAINE VISCOUS 2 % MT SOLN
OROMUCOSAL | Status: AC
  Filled 2015-04-29: qty 15

## 2015-04-29 MED ORDER — MEPERIDINE HCL 100 MG/ML IJ SOLN
INTRAMUSCULAR | Status: AC
  Filled 2015-04-29: qty 2

## 2015-04-29 NOTE — H&P (Signed)
  Primary Care Physician:  Mickie Hillier, MD Primary Gastroenterologist:  Dr. Oneida Alar  Pre-Procedure History & Physical: HPI:  [name] is a 29 y.o. male here for DYSPEPSIA/ABNORMAL AMPULLA.  Past Medical History  Diagnosis Date  . Arthritis   . Gout   . Elevated LFTs     mildly elevated transaminases in past, now normalized   . History of kidney stones     Past Surgical History  Procedure Laterality Date  . Kidney stone surgery  age 67 and age 70    lithotripsy, stent  . Wisdom tooth extraction    . Colonoscopy with esophagogastroduodenoscopy (egd) N/A 02/25/2014    NL TI, 2 SIMPLE ADENOMAS(1:>1 CM), LGE IH-FG POLYPS, PROMINENT AMPULLA  . Eus N/A 03/20/2014    Dr. Paulita Fujita: prominent major papilla s/p biopsy, minor papilla without clear adenomatous or mass-like appearance, chronic duodenitis on path.   . Appendectomy  01/27/2015    Prior to Admission medications   Medication Sig Start Date End Date Taking? Authorizing Provider  allopurinol (ZYLOPRIM) 300 MG tablet Take 1 tablet (300 mg total) by mouth daily. 06/18/14  Yes Mikey Kirschner, MD  pantoprazole (PROTONIX) 40 MG tablet Take 1 tablet (40 mg total) by mouth 2 (two) times daily before a meal. 10/31/14  Yes Nilda Simmer, NP    Allergies as of 04/09/2015 - Review Complete 03/31/2015  Allergen Reaction Noted  . Phenergan [promethazine hcl] Other (See Comments) 03/13/2014    Family History  Problem Relation Age of Onset  . Diabetes Father   . COPD Father   . Colon cancer Neg Hx     does not know paternal side    History   Social History  . Marital Status: Married    Spouse Name: N/A  . Number of Children: N/A  . Years of Education: N/A   Occupational History  . Construction    Social History Main Topics  . Smoking status: Never Smoker   . Smokeless tobacco: Never Used  . Alcohol Use: No  . Drug Use: No  . Sexual Activity: Not on file   Other Topics Concern  . Not on file   Social History  Narrative    Review of Systems: See HPI, otherwise negative ROS   Physical Exam: Ht 5\' 11"  (1.803 m)  Wt 250 lb (113.399 kg)  BMI 34.88 kg/m2 General:   Alert,  pleasant and cooperative in NAD Head:  Normocephalic and atraumatic. Neck:  Supple; Lungs:  Clear throughout to auscultation.    Heart:  Regular rate and rhythm. Abdomen:  Soft, nontender and nondistended. Normal bowel sounds, without guarding, and without rebound.   Neurologic:  Alert and  oriented x4;  grossly normal neurologically.  Impression/Plan:    DYSPEPSIA/ABNORMAL AMPULLA  PLAN:  EGD TODAY

## 2015-04-29 NOTE — Op Note (Signed)
[facility] [address] [place], [address]   ENDOSCOPY PROCEDURE REPORT  PATIENT: [name], [name]  MR#: [id number] BIRTHDATE: Jan 10, 1986 , 28  yrs. old GENDER: male  ENDOSCOPIST: Danie Binder, MD REFERRED DE:YCXKGYJ Wolfgang Phoenix, M.D.  PROCEDURE DATE: 30-Apr-2015 PROCEDURE:   EGD w/ biopsy  INDICATIONS:Enlarged ampulla on EGD/EUS in 2015-bx showed duodenitis. MEDICATIONS: Demerol 150 mg IV, Versed 8 mg IV, and Zofran 4mg  IV TOPICAL ANESTHETIC:   Viscous Xylocaine ASA CLASS:  DESCRIPTION OF PROCEDURE:     Physical exam was performed.  Informed consent was obtained from the patient after explaining the benefits, risks, and alternatives to the procedure.  The patient was connected to the monitor and placed in the left lateral position.  Continuous oxygen was provided by nasal cannula and IV medicine administered through an indwelling cannula.  After administration of sedation, the patients esophagus was intubated and the EG-[id number] ([id number])  endoscope was advanced under direct visualization to the second portion of the duodenum.  The scope was removed slowly by carefully examining the color, texture, anatomy, and integrity of the mucosa on the way out.  The patient was recovered in endoscopy and discharged home in satisfactory condition.    ESOPHAGUS: The mucosa of the esophagus appeared normal.   STOMACH: Mild non-erosive gastritis (inflammation) was found in the gastric antrum.   PROMINENT AMPULLA   6 MM x 6 MM. ONE BIOPSY TAKEN FROM SIDE OF AMPULLA. COMPLICATIONS: There were no immediate complications.  ENDOSCOPIC IMPRESSION: 1.   MILD Non-erosive gastritis (inflammation) was found in the gastric antrum 3.   PROMINENT AMPULLA   6 MM x 6 MM  RECOMMENDATIONS: CONTINUE YOUR WEIGHT LOSS EFFORTS. FOLLOW A LOW FAT DIET. AVOID TRIGGERS FOR GASTRITIS. CONTINUE PROTONIX.  TAKE 30 MINUTES PRIOR TO MEALS TWICE DAILY. AWAIT BIOPSY. FOLLOW UP IN 6  MOS.  REPEAT EXAM: eSigned:  Danie Binder, MD Apr 30, 2015 3:08 PM    CPT CODES: ICD CODES:  The ICD and CPT codes recommended by this software are interpretations from the data that the clinical staff has captured with the software.  The verification of the translation of this report to the ICD and CPT codes and modifiers is the sole responsibility of the health care institution and practicing physician where this report was generated.  [employer] will not be held responsible for the validity of the ICD and CPT codes included on this report.  AMA assumes no liability for data contained or not contained herein. CPT is a Designer, television/film set of the [employer].

## 2015-04-29 NOTE — Discharge Instructions (Signed)
You have mild gastritis. YOUR AMPULLA LOOKED THE SAME. IT'S 6 MM BY 6 MM. I TOOK ONE BIOPSY.    CONTINUE YOUR WEIGHT LOSS EFFORTS.  FOLLOW A LOW FAT DIET. SEE INFO BELOW.  AVOID TRIGGERS FOR GASTRITIS. SEE INFO BELOW.  CONTINUE PROTONIX. TAKE 30 MINUTES PRIOR TO MEALS TWICE DAILY.  FOLLOW UP IN 6 MOS.  UPPER ENDOSCOPY AFTER CARE Read the instructions outlined below and refer to this sheet in the next week. These discharge instructions provide you with general information on caring for yourself after you leave the hospital. While your treatment has been planned according to the most current medical practices available, unavoidable complications occasionally occur. If you have any problems or questions after discharge, call DR. Xian Apostol, [phone].  ACTIVITY  You may resume your regular activity, but move at a slower pace for the next 24 hours.   Take frequent rest periods for the next 24 hours.   Walking will help get rid of the air and reduce the bloated feeling in your belly (abdomen).   No driving for 24 hours (because of the medicine (anesthesia) used during the test).   You may shower.   Do not sign any important legal documents or operate any machinery for 24 hours (because of the anesthesia used during the test).    NUTRITION  Drink plenty of fluids.   You may resume your normal diet as instructed by your doctor.   Begin with a light meal and progress to your normal diet. Heavy or fried foods are harder to digest and may make you feel sick to your stomach (nauseated).   Avoid alcoholic beverages for 24 hours or as instructed.    MEDICATIONS  You may resume your normal medications.   WHAT YOU CAN EXPECT TODAY  Some feelings of bloating in the abdomen.   Passage of more gas than usual.    IF YOU HAD A BIOPSY TAKEN DURING THE UPPER ENDOSCOPY:  Eat a soft diet IF YOU HAVE NAUSEA, BLOATING, ABDOMINAL PAIN, OR VOMITING.    FINDING OUT THE RESULTS OF YOUR  TEST Not all test results are available during your visit. DR. Oneida Alar WILL CALL YOU WITHIN 7 DAYS OF YOUR PROCEDUE WITH YOUR RESULTS. Do not assume everything is normal if you have not heard from DR. Adileny Delon IN ONE WEEK, CALL HER OFFICE AT [phone].  SEEK IMMEDIATE MEDICAL ATTENTION AND CALL THE OFFICE: [phone] IF:  You have more than a spotting of blood in your stool.   Your belly is swollen (abdominal distention).   You are nauseated or vomiting.   You have a temperature over 101F.   You have abdominal pain or discomfort that is severe or gets worse throughout the day.   Gastritis  Gastritis is an inflammation (the body's way of reacting to injury and/or infection) of the stomach. It is often caused by viral or bacterial (germ) infections. It can also be caused BY ASPIRIN, BC/GOODY POWDER'S, (IBUPROFEN) MOTRIN, OR ALEVE (NAPROXEN), chemicals (including alcohol), SPICY FOODS, and medications. This illness may be associated with generalized malaise (feeling tired, not well), UPPER ABDOMINAL STOMACH cramps, and fever. One common bacterial cause of gastritis is an organism known as H. Pylori. This can be treated with antibiotics.    Low-Fat Diet  BREADS, CEREALS, PASTA, RICE, DRIED PEAS, AND BEANS These products are high in carbohydrates and most are low in fat. Therefore, they can be increased in the diet as substitutes for fatty foods. They too, however, contain calories and  should not be eaten in excess. Cereals can be eaten for snacks as well as for breakfast.  Include foods that contain fiber (fruits, vegetables, whole grains, and legumes). Research shows that fiber may lower blood cholesterol levels, especially the water-soluble fiber found in fruits, vegetables, oat products, and legumes.  FRUITS AND VEGETABLES It is good to eat fruits and vegetables. Besides being sources of fiber, both are rich in vitamins and some minerals. They help you get the daily allowances of these  nutrients. Fruits and vegetables can be used for snacks and desserts.  MEATS Limit lean meat, chicken, [place], and fish to no more than 6 ounces per day.  Beef, Pork, and Lamb Use lean cuts of beef, pork, and lamb. Lean cuts include:  Extra-lean ground beef.  Arm roast.  Sirloin tip.  Center-cut ham.  Round steak.  Loin chops.  Rump roast.  Tenderloin.  Trim all fat off the outside of meats before cooking. It is not necessary to severely decrease the intake of red meat, but lean choices should be made. Lean meat is rich in protein and contains a highly absorbable form of iron. Premenopausal women, in particular, should avoid reducing lean red meat because this could increase the risk for low red blood cells (iron-deficiency anemia).  Chicken and [place] These are good sources of protein. The fat of poultry can be reduced by removing the skin and underlying fat layers before cooking. Chicken and [place] can be substituted for lean red meat in the diet. Poultry should not be fried or covered with high-fat sauces.  Fish and Shellfish Fish is a good source of protein. Shellfish contain cholesterol, but they usually are low in saturated fatty acids. The preparation of fish is important. Like chicken and [place], they should not be fried or covered with high-fat sauces.  EGGS Egg whites contain no fat or cholesterol. They can be eaten often. Try 1 to 2 egg whites instead of whole eggs in recipes or use egg substitutes that do not contain yolk.  MILK AND DAIRY PRODUCTS Use skim or 1% milk instead of 2% or whole milk. Decrease whole milk, natural, and processed cheeses. Use nonfat or low-fat (2%) cottage cheese or low-fat cheeses made from vegetable oils. Choose nonfat or low-fat (1 to 2%) yogurt. Experiment with evaporated skim milk in recipes that call for heavy cream. Substitute low-fat yogurt or low-fat cottage cheese for sour cream in dips and salad dressings. Have at least 2 servings of low-fat  dairy products, such as 2 glasses of skim (or 1%) milk each day to help get your daily calcium intake.  FATS AND OILS Reduce the total intake of fats, especially saturated fat. Butterfat, lard, and beef fats are high in saturated fat and cholesterol. These should be avoided as much as possible. Vegetable fats do not contain cholesterol, but certain vegetable fats, such as coconut oil, palm oil, and palm kernel oil are very high in saturated fats. These should be limited. These fats are often used in bakery goods, processed foods, popcorn, oils, and nondairy creamers. Vegetable shortenings and some peanut butters contain hydrogenated oils, which are also saturated fats. Read the labels on these foods and check for saturated vegetable oils.  Unsaturated vegetable oils and fats do not raise blood cholesterol. However, they should be limited because they are fats and are high in calories. Total fat should still be limited to 30% of your daily caloric intake. Desirable liquid vegetable oils are corn oil, cottonseed oil, olive oil,  canola oil, safflower oil, soybean oil, and sunflower oil. Peanut oil is not as good, but small amounts are acceptable. Buy a heart-healthy tub margarine that has no partially hydrogenated oils in the ingredients. Mayonnaise and salad dressings often are made from unsaturated fats, but they should also be limited because of their high calorie and fat content. Seeds, nuts, peanut butter, olives, and avocados are high in fat, but the fat is mainly the unsaturated type. These foods should be limited mainly to avoid excess calories and fat.  OTHER EATING TIPS Snacks  Most sweets should be limited as snacks. They tend to be rich in calories and fats, and their caloric content outweighs their nutritional value. Some good choices in snacks are graham crackers, melba toast, soda crackers, bagels (no egg), English muffins, fruits, and vegetables. These snacks are preferable to snack crackers,  [place] fries, and chips. Popcorn should be air-popped or cooked in small amounts of liquid vegetable oil.  Desserts Eat fruit, low-fat yogurt, and fruit ices instead of pastries, cake, and cookies. Sherbet, angel food cake, gelatin dessert, frozen low-fat yogurt, or other frozen products that do not contain saturated fat (pure fruit juice bars, frozen ice pops) are also acceptable.   COOKING METHODS Choose those methods that use little or no fat. They include: Poaching.  Braising.  Steaming.  Grilling.  Baking.  Stir-frying.  Broiling.  Microwaving.  Foods can be cooked in a nonstick pan without added fat, or use a nonfat cooking spray in regular cookware. Limit fried foods and avoid frying in saturated fat. Add moisture to lean meats by using water, broth, cooking wines, and other nonfat or low-fat sauces along with the cooking methods mentioned above. Soups and stews should be chilled after cooking. The fat that forms on top after a few hours in the refrigerator should be skimmed off. When preparing meals, avoid using excess salt. Salt can contribute to raising blood pressure in some people.  EATING AWAY FROM HOME Order entres, potatoes, and vegetables without sauces or butter. When meat exceeds the size of a deck of cards (3 to 4 ounces), the rest can be taken home for another meal. Choose vegetable or fruit salads and ask for low-calorie salad dressings to be served on the side. Use dressings sparingly. Limit high-fat toppings, such as bacon, crumbled eggs, cheese, sunflower seeds, and olives. Ask for heart-healthy tub margarine instead of butter.

## 2015-04-30 ENCOUNTER — Encounter (HOSPITAL_COMMUNITY): Payer: Self-pay | Admitting: Gastroenterology

## 2015-05-26 ENCOUNTER — Ambulatory Visit (INDEPENDENT_AMBULATORY_CARE_PROVIDER_SITE_OTHER): Payer: BLUE CROSS/BLUE SHIELD | Admitting: Nurse Practitioner

## 2015-05-26 ENCOUNTER — Encounter: Payer: Self-pay | Admitting: Nurse Practitioner

## 2015-05-26 VITALS — BP 116/86 | Temp 98.5°F | Ht 71.0 in | Wt 250.1 lb

## 2015-05-26 DIAGNOSIS — L259 Unspecified contact dermatitis, unspecified cause: Secondary | ICD-10-CM

## 2015-05-26 DIAGNOSIS — T63461A Toxic effect of venom of wasps, accidental (unintentional), initial encounter: Secondary | ICD-10-CM

## 2015-05-26 MED ORDER — METHYLPREDNISOLONE ACETATE 40 MG/ML IJ SUSP
40.0000 mg | Freq: Once | INTRAMUSCULAR | Status: AC
  Administered 2015-05-26: 40 mg via INTRAMUSCULAR

## 2015-05-26 MED ORDER — CEPHALEXIN 500 MG PO CAPS: 500.0000 mg | ORAL_CAPSULE | Freq: Three times a day (TID) | ORAL | Status: DC

## 2015-05-26 MED ORDER — PREDNISONE 20 MG PO TABS: ORAL_TABLET | ORAL | Status: DC

## 2015-05-27 ENCOUNTER — Encounter: Payer: Self-pay | Admitting: Nurse Practitioner

## 2015-05-27 NOTE — Progress Notes (Signed)
Subjective:  Presents for complaints of swelling and tenderness in his right ankle after being stung by wasp twice 4 days ago. States the wasp stung each side of his ankle. Was doing slightly better and over the past couple days more tenderness has been noted. No fever. Also slight poison oak rash on both of his arms. Very pruritic.  Objective:   BP 116/86 mmHg  Temp(Src) 98.5 F (36.9 C) (Oral)  Ht 5\' 11"  (1.803 m)  Wt 250 lb 2 oz (113.456 kg)  BMI 34.90 kg/m2 NAD. Alert, oriented. Lungs clear. Heart regular rate rhythm. Several linear areas of pink papules noted on both arms. Moderate edema noted around the lateral right ankle area, minimal erythema but slightly warm to the touch and very tender in the lower right lateral leg area. No masses noted.  Assessment: Contact dermatitis - Plan: methylPREDNISolone acetate (DEPO-MEDROL) injection 40 mg  Wasp sting, accidental or unintentional, initial encounter - Plan: methylPREDNISolone acetate (DEPO-MEDROL) injection 40 mg  Plan:  Meds ordered this encounter  Medications  . cephALEXin (KEFLEX) 500 MG capsule    Sig: Take 1 capsule (500 mg total) by mouth 3 (three) times daily.    Dispense:  21 capsule    Refill:  0    Order Specific Question:  Supervising Provider    Answer:  Mikey Kirschner [[id number]]  . predniSONE (DELTASONE) 20 MG tablet    Sig: 3 po qd x 2d then 2 po qd x 2d then one po qd x 2d    Dispense:  12 tablet    Refill:  0    Start 6/14 am    Order Specific Question:  Supervising Provider    Answer:  Mikey Kirschner [[id number]]  . methylPREDNISolone acetate (DEPO-MEDROL) injection 40 mg    Sig:    Warning signs reviewed. Warm compresses. Call back by the end of the week if no improvement, sooner if worse.

## 2015-06-06 ENCOUNTER — Other Ambulatory Visit: Payer: Self-pay | Admitting: Nurse Practitioner

## 2015-06-06 MED ORDER — VALACYCLOVIR HCL 1 G PO TABS: 1000.0000 mg | ORAL_TABLET | Freq: Three times a day (TID) | ORAL | Status: DC

## 2015-06-06 MED ORDER — HYDROCODONE-ACETAMINOPHEN 5-325 MG PO TABS: 1.0000 | ORAL_TABLET | ORAL | Status: DC | PRN

## 2015-06-10 ENCOUNTER — Telehealth: Payer: Self-pay | Admitting: Nurse Practitioner

## 2015-06-10 ENCOUNTER — Encounter: Payer: Self-pay | Admitting: Nurse Practitioner

## 2015-06-10 DIAGNOSIS — B029 Zoster without complications: Secondary | ICD-10-CM | POA: Insufficient documentation

## 2015-06-10 NOTE — Telephone Encounter (Signed)
S: Patient came into the office on 06/06/15 for recheck of his rash. Stopped Prednisone 2 days ago. Original rashes have resolved. Now having rash in the right mid back area. Non tender. No fever. General malaise for several days. Pain into the right mid chest wall near right breast. Rash began within last 48 hours.   O: cluster of slightly raised early vesicles noted adjacent to the right mid spinal area. None on the left. Tenderness to palpation along the same dermatone line at the right anterior axillary line into right breast area. No other rash noted.   A: herpes zoster  P: Valtrex as directed. Reviewed symptomatic care and warning signs. Call back if further problems.

## 2015-06-13 ENCOUNTER — Ambulatory Visit (INDEPENDENT_AMBULATORY_CARE_PROVIDER_SITE_OTHER): Payer: BLUE CROSS/BLUE SHIELD | Admitting: Nurse Practitioner

## 2015-06-13 ENCOUNTER — Encounter: Payer: Self-pay | Admitting: Nurse Practitioner

## 2015-06-13 VITALS — BP 122/84 | Wt 250.0 lb

## 2015-06-13 DIAGNOSIS — L739 Follicular disorder, unspecified: Secondary | ICD-10-CM

## 2015-06-13 DIAGNOSIS — B029 Zoster without complications: Secondary | ICD-10-CM

## 2015-06-13 DIAGNOSIS — B0229 Other postherpetic nervous system involvement: Secondary | ICD-10-CM | POA: Diagnosis not present

## 2015-06-13 MED ORDER — DOXYCYCLINE HYCLATE 100 MG PO TABS: 100.0000 mg | ORAL_TABLET | Freq: Two times a day (BID) | ORAL | Status: DC

## 2015-06-13 MED ORDER — GABAPENTIN 100 MG PO CAPS: 100.0000 mg | ORAL_CAPSULE | Freq: Two times a day (BID) | ORAL | Status: DC

## 2015-06-17 ENCOUNTER — Encounter: Payer: Self-pay | Admitting: Nurse Practitioner

## 2015-06-17 DIAGNOSIS — B0229 Other postherpetic nervous system involvement: Secondary | ICD-10-CM | POA: Insufficient documentation

## 2015-06-17 NOTE — Progress Notes (Signed)
Subjective:  Presents for recheck on his shingles. Has 1 more dose of his Valtrex. Headache has improved. No fever. No further ear pain. Continues to have pain along the area where her shingles occurred.  Objective:   BP 122/84 mmHg  Wt 250 lb (113.399 kg) NAD. Alert, oriented. Lungs clear. Heart RRR. Rash has resolved with remaining areas of hyperpigmentation. Pain along dermatone mid back area into right mid chest wall. Multiple tiny pustules along anterior trunk.   Assessment:  Problem List Items Addressed This Visit      Other   Herpes zoster - Primary    Other Visit Diagnoses     steroid induced Folliculitis         Post herpetic neuralgia  Plan:  Meds ordered this encounter  Medications  . ALLOPURINOL PO    Sig: Take by mouth daily.  Marland Kitchen doxycycline (VIBRA-TABS) 100 MG tablet    Sig: Take 1 tablet (100 mg total) by mouth 2 (two) times daily.    Dispense:  20 tablet    Refill:  0    Order Specific Question:  Supervising Provider    Answer:  Mikey Kirschner [[id number]]  . gabapentin (NEURONTIN) 100 MG capsule    Sig: Take 1 capsule (100 mg total) by mouth 2 (two) times daily. For nerve pain    Dispense:  60 capsule    Refill:  2    Order Specific Question:  Supervising Provider    Answer:  Mikey Kirschner [[id number]]   Complete Valtrex as directed. Will slowly titrate dose of gabapentin to help postherpetic neuralgia. Return if symptoms worsen or fail to improve.

## 2015-06-26 ENCOUNTER — Encounter: Payer: Self-pay | Admitting: Nurse Practitioner

## 2015-07-01 ENCOUNTER — Telehealth: Payer: Self-pay | Admitting: Nurse Practitioner

## 2015-07-01 NOTE — Telephone Encounter (Signed)
Patient was prescribed gabapentin (NEURONTIN) 100 MG capsule and Hoyle Sauer told patient to double up taking them if he needed to.  He did have to double up on his dosage for these and now he is out.  The pharmacy is saying he is 12 days early on filling the Rx, and he wants to know if we can call [facility] and tell them they can go ahead and fill it?

## 2015-07-02 MED ORDER — GABAPENTIN 100 MG PO CAPS: ORAL_CAPSULE | ORAL | Status: DC

## 2015-07-02 NOTE — Telephone Encounter (Signed)
Please clarify current dose. He will need a new Rx because he was told to double 100 mg. Also please send in enough for new dosing. Thanks.

## 2015-07-02 NOTE — Telephone Encounter (Signed)
Called patient and verified that patient is taking Neurontin 100MG , 2 capsules twice a day. Sent in new prescription per Pearson Forster for 100 MG capsules 2 capsules twice a day to [facility].

## 2015-07-04 ENCOUNTER — Encounter: Payer: Self-pay | Admitting: Nurse Practitioner

## 2015-07-04 ENCOUNTER — Ambulatory Visit (INDEPENDENT_AMBULATORY_CARE_PROVIDER_SITE_OTHER): Payer: BLUE CROSS/BLUE SHIELD | Admitting: Nurse Practitioner

## 2015-07-04 VITALS — BP 112/84 | Temp 98.4°F | Ht 71.0 in | Wt 249.5 lb

## 2015-07-04 DIAGNOSIS — M1000 Idiopathic gout, unspecified site: Secondary | ICD-10-CM

## 2015-07-04 DIAGNOSIS — K760 Fatty (change of) liver, not elsewhere classified: Secondary | ICD-10-CM

## 2015-07-04 DIAGNOSIS — L739 Follicular disorder, unspecified: Secondary | ICD-10-CM | POA: Diagnosis not present

## 2015-07-04 MED ORDER — CLINDAMYCIN HCL 300 MG PO CAPS: 300.0000 mg | ORAL_CAPSULE | Freq: Three times a day (TID) | ORAL | Status: DC

## 2015-07-04 NOTE — Patient Instructions (Signed)
While on antibiotic, eat Activia yogurt everyday or take bowel probiotic

## 2015-07-05 LAB — CBC WITH DIFFERENTIAL/PLATELET
BASOS ABS: 0 10*3/uL (ref 0.0–0.2)
Basos: 0 %
EOS (ABSOLUTE): 0.2 10*3/uL (ref 0.0–0.4)
Eos: 2 %
HEMATOCRIT: 41.6 % (ref 37.5–51.0)
Hemoglobin: 15.1 g/dL (ref 12.6–17.7)
Immature Grans (Abs): 0 10*3/uL (ref 0.0–0.1)
Immature Granulocytes: 0 %
LYMPHS ABS: 3.1 10*3/uL (ref 0.7–3.1)
LYMPHS: 34 %
MCH: 31.2 pg (ref 26.6–33.0)
MCHC: 36.3 g/dL — ABNORMAL HIGH (ref 31.5–35.7)
MCV: 86 fL (ref 79–97)
Monocytes Absolute: 0.7 10*3/uL (ref 0.1–0.9)
Monocytes: 7 %
NEUTROS ABS: 5.1 10*3/uL (ref 1.4–7.0)
NEUTROS PCT: 57 %
PLATELETS: 248 10*3/uL (ref 150–379)
RBC: 4.84 x10E6/uL (ref 4.14–5.80)
RDW: 14.3 % (ref 12.3–15.4)
WBC: 9.1 10*3/uL (ref 3.4–10.8)

## 2015-07-05 LAB — HEPATIC FUNCTION PANEL
ALT: 60 IU/L — ABNORMAL HIGH (ref 0–44)
AST: 30 IU/L (ref 0–40)
Albumin: 4.7 g/dL (ref 3.5–5.5)
Alkaline Phosphatase: 62 IU/L (ref 39–117)
BILIRUBIN TOTAL: 1 mg/dL (ref 0.0–1.2)
Bilirubin, Direct: 0.23 mg/dL (ref 0.00–0.40)
Total Protein: 7.3 g/dL (ref 6.0–8.5)

## 2015-07-05 LAB — BASIC METABOLIC PANEL
BUN/Creatinine Ratio: 8 (ref 8–19)
BUN: 10 mg/dL (ref 6–20)
CO2: 24 mmol/L (ref 18–29)
CREATININE: 1.21 mg/dL (ref 0.76–1.27)
Calcium: 9.6 mg/dL (ref 8.7–10.2)
Chloride: 99 mmol/L (ref 97–108)
GFR calc Af Amer: 94 mL/min/{1.73_m2} (ref >59)
GFR calc non Af Amer: 81 mL/min/{1.73_m2} (ref >59)
GLUCOSE: 82 mg/dL (ref 65–99)
Potassium: 3.9 mmol/L (ref 3.5–5.2)
Sodium: 143 mmol/L (ref 134–144)

## 2015-07-05 LAB — URIC ACID: Uric Acid: 6.1 mg/dL (ref 3.7–8.6)

## 2015-07-08 ENCOUNTER — Encounter: Payer: Self-pay | Admitting: Nurse Practitioner

## 2015-07-08 NOTE — Progress Notes (Signed)
Subjective:  Presents for complaints of a rash along the sides of his head for the past 2 days. Mildly tender. His wife took a straight pin and popped one of the lesions, purulent drainage noted. No new hygiene products. Does not wear a hat on a regular basis. No fever. No other rash. Also questions whether he is having a gout flareup, has mild tenderness in the left knee and right great toe. Currently on allopurinol.  Objective:   BP 112/84 mmHg  Temp(Src) 98.4 F (36.9 C) (Oral)  Ht 5\' 11"  (1.803 m)  Wt 249 lb 8 oz (113.172 kg)  BMI 34.81 kg/m2 NAD. Alert, oriented. Lungs clear. Heart regular rate rhythm. Multiple pustular lesions of various sizes noted along the periphery of the scalp from the temporal area to occipital area around the head. None on the face or neck. Mildly tender to palpation. No erythema or warmth noted in the left knee or right podagra, mildly tender to palpation.  Assessment:  Problem List Items Addressed This Visit      Digestive   Fatty liver   Relevant Orders   CBC with Differential/Platelet (Completed)   Hepatic function panel (Completed)   Basic metabolic panel (Completed)     Other   Gout   Relevant Orders   Basic metabolic panel (Completed)   Uric acid (Completed)    Other Visit Diagnoses    Folliculitis    -  Primary    Relevant Orders    CBC with Differential/Platelet (Completed)    Basic metabolic panel (Completed)      Plan:  Meds ordered this encounter  Medications  . clindamycin (CLEOCIN) 300 MG capsule    Sig: Take 1 capsule (300 mg total) by mouth 3 (three) times daily.    Dispense:  30 capsule    Refill:  0    Order Specific Question:  Supervising Provider    Answer:  Mikey Kirschner [[id number]]  . allopurinol (ZYLOPRIM) 300 MG tablet    Sig: Take 300 mg by mouth daily.    Refill:  4   Due to recent rashes, screening CBC and other labs. Start clindamycin as directed. Warning signs reviewed. Call back next week if no improvement,  sooner if worse.

## 2015-07-14 ENCOUNTER — Other Ambulatory Visit: Payer: Self-pay | Admitting: Family Medicine

## 2015-07-23 ENCOUNTER — Telehealth: Payer: Self-pay | Admitting: Family Medicine

## 2015-07-23 NOTE — Telephone Encounter (Signed)
Pt called to let Hoyle Sauer know that he was put on new meds and was told to call her back before the end of the day. Pt is wanting Hoyle Sauer to call him back today.

## 2015-07-25 NOTE — Telephone Encounter (Signed)
Spoke with [name], he is not having any further nerve pain from shingles. Has already stopped evening dose of Neurontin. Cut back to 100 mg in the am for 3 days then stop completely. No longer on pain meds. Had a recent kidney stone; seeing urology.

## 2015-08-26 ENCOUNTER — Telehealth: Payer: Self-pay | Admitting: Nurse Practitioner

## 2015-08-26 DIAGNOSIS — Z79899 Other long term (current) drug therapy: Secondary | ICD-10-CM

## 2015-08-26 DIAGNOSIS — M109 Gout, unspecified: Secondary | ICD-10-CM

## 2015-08-26 NOTE — Telephone Encounter (Signed)
Nurses: please clarify: why hospitalized? Blood levels of med or uric acid for gout? Also, was his kidney function normal?

## 2015-08-26 NOTE — Telephone Encounter (Signed)
Patient was recently hospitalized and told that his blood levels are too high and that his medication would need to be adjusted.  He has two pills left of his allopurinol, and he wants to know if he needs to see Hoyle Sauer before having this refilled?   [place]

## 2015-08-26 NOTE — Telephone Encounter (Signed)
Patient was admitted to [facility] on Friday because he had a kidney stone procedure done and he developed an infection. He states that the doctor told him that his uric acid level was almost 12 and that is too high. His kidney function was normal.

## 2015-08-28 ENCOUNTER — Other Ambulatory Visit: Payer: Self-pay | Admitting: Nurse Practitioner

## 2015-08-28 MED ORDER — ALLOPURINOL 300 MG PO TABS: 300.0000 mg | ORAL_TABLET | Freq: Two times a day (BID) | ORAL | Status: DC

## 2015-08-28 NOTE — Telephone Encounter (Signed)
Notified patient increase dose to BID; will call in new RX; repeat uric acid level and Met 7 in one month. Patient verbalized understanding.

## 2015-08-28 NOTE — Telephone Encounter (Signed)
Patient called again to check on this message. He says that he is completely out of the allopurinol.  He does have refills, but he is waiting on Hoyle Sauer to let him know what he needs to do about his dosage.

## 2015-08-28 NOTE — Telephone Encounter (Signed)
Increase dose to BID; will call in new RX; repeat uric acid level and Met 7 in one month.

## 2015-10-13 ENCOUNTER — Ambulatory Visit (INDEPENDENT_AMBULATORY_CARE_PROVIDER_SITE_OTHER): Payer: BLUE CROSS/BLUE SHIELD | Admitting: Family Medicine

## 2015-10-13 ENCOUNTER — Ambulatory Visit (HOSPITAL_COMMUNITY)
Admission: RE | Admit: 2015-10-13 | Discharge: 2015-10-13 | Disposition: A | Discharge status: Home / Self Care | Payer: BLUE CROSS/BLUE SHIELD | Source: Ambulatory Visit | Attending: Family Medicine | Admitting: Family Medicine

## 2015-10-13 ENCOUNTER — Encounter: Payer: Self-pay | Admitting: Family Medicine

## 2015-10-13 VITALS — BP 116/70 | Temp 98.5°F | Ht 71.0 in | Wt 244.1 lb

## 2015-10-13 DIAGNOSIS — M25562 Pain in left knee: Secondary | ICD-10-CM | POA: Insufficient documentation

## 2015-10-13 DIAGNOSIS — M76892 Other specified enthesopathies of left lower limb, excluding foot: Secondary | ICD-10-CM | POA: Insufficient documentation

## 2015-10-13 DIAGNOSIS — M7042 Prepatellar bursitis, left knee: Secondary | ICD-10-CM | POA: Diagnosis not present

## 2015-10-13 MED ORDER — NABUMETONE 750 MG PO TABS: 750.0000 mg | ORAL_TABLET | Freq: Two times a day (BID) | ORAL | Status: DC

## 2015-10-13 NOTE — Progress Notes (Signed)
   Subjective:    Patient ID: [name], male    DOB: 1986-05-14, 29 y.o.   MRN: [id number]  Knee Pain  The incident occurred more than 1 week ago. There was no injury mechanism. The pain is present in the left knee. The quality of the pain is described as shooting, stabbing, burning and aching. The pain is at a severity of 3/10. The pain is moderate. The pain has been constant since onset. He reports no foreign bodies present. The symptoms are aggravated by movement. He has tried nothing for the symptoms. The treatment provided no relief.    Went to stand up felt a pop and a burn  Occurred several wks ago   Is noted swelling in the left knee.  History of gout   Hurts with pressure and aches with change of osition  Crouches on his knees quite a bit with this work. 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  Review of Systems No headache no chest pain no back pain ROS otherwise negative    Objective:   Physical Exam alert vitals stable.  Lungs clear heart regular in rhythm.  Left knee swelling evident prepatellar in nature no joint line tenderness no cruciate ligament looseness no medial meniscal pain        Assessment & Plan:  Impression prepatellar bursitis plan x-ray knee. Trial of anti-inflammatories use knee pads avoid deep crouching WSL

## 2015-10-15 ENCOUNTER — Telehealth: Payer: Self-pay | Admitting: Family Medicine

## 2015-10-15 NOTE — Telephone Encounter (Signed)
Very slight arthritis changes, hopefully med will calm do3n pain , if persists wil need to see ortho

## 2015-10-15 NOTE — Telephone Encounter (Signed)
Discussed with pt

## 2015-10-15 NOTE — Telephone Encounter (Signed)
Pt called wanting to know the results of his knee xray.

## 2015-10-27 ENCOUNTER — Encounter: Payer: Self-pay | Admitting: Gastroenterology

## 2015-12-04 ENCOUNTER — Telehealth: Payer: Self-pay | Admitting: Gastroenterology

## 2015-12-04 ENCOUNTER — Encounter (INDEPENDENT_AMBULATORY_CARE_PROVIDER_SITE_OTHER): Payer: Self-pay | Admitting: Gastroenterology

## 2015-12-04 ENCOUNTER — Encounter: Payer: Self-pay | Admitting: Gastroenterology

## 2015-12-04 NOTE — Progress Notes (Signed)
   Subjective:    Patient ID: [name], male    DOB: 1986/04/09, 29 y.o.   MRN: [id number]  HPI NO SHOW/NO CALL  Past Medical History  Diagnosis Date  . Arthritis   . Gout   . Elevated LFTs     mildly elevated transaminases in past, now normalized   . History of kidney stones     Review of Systems     Objective:   Physical Exam        Assessment & Plan:

## 2015-12-04 NOTE — Telephone Encounter (Signed)
PATIENT WAS A NO SHOW AND LETTER SENT  °

## 2015-12-04 NOTE — Telephone Encounter (Signed)
REVIEWED-NO ADDITIONAL RECOMMENDATIONS. 

## 2015-12-12 ENCOUNTER — Other Ambulatory Visit: Payer: Self-pay | Admitting: Nurse Practitioner

## 2015-12-26 ENCOUNTER — Encounter: Payer: Self-pay | Admitting: Nurse Practitioner

## 2015-12-26 ENCOUNTER — Ambulatory Visit (INDEPENDENT_AMBULATORY_CARE_PROVIDER_SITE_OTHER): Payer: BLUE CROSS/BLUE SHIELD | Admitting: Nurse Practitioner

## 2015-12-26 VITALS — BP 120/88 | Temp 98.4°F | Ht 71.0 in | Wt 252.5 lb

## 2015-12-26 DIAGNOSIS — B0089 Other herpesviral infection: Secondary | ICD-10-CM | POA: Diagnosis not present

## 2015-12-26 MED ORDER — GABAPENTIN 100 MG PO CAPS: 200.0000 mg | ORAL_CAPSULE | Freq: Two times a day (BID) | ORAL | Status: DC

## 2015-12-26 MED ORDER — VALACYCLOVIR HCL 1 G PO TABS: 1000.0000 mg | ORAL_TABLET | Freq: Three times a day (TID) | ORAL | Status: DC

## 2015-12-29 ENCOUNTER — Encounter: Payer: Self-pay | Admitting: Nurse Practitioner

## 2015-12-29 NOTE — Progress Notes (Signed)
Subjective:   Presents for complaints of pain along the right middle and parietal scalp area for the past 3 days. Pain with palpation. Minimal headache. Right ear pain. No fever. Minimal malaise. Has not noticed a rash. Was treated for herpetic infection on 06/13/2015. The symptoms have resolved, patient is off his gabapentin.  Objective:   BP 120/88 mmHg  Temp(Src) 98.4 F (36.9 C) (Oral)  Ht 5\' 11"  (1.803 m)  Wt 252 lb 8 oz (114.533 kg)  BMI 35.23 kg/m2  NAD. Alert, oriented. TMs minimal clear effusion, no erythema. Pharynx clear. Neck supple with minimal anterior adenopathy. Lungs clear. Heart regular rate rhythm. Tenderness noted with palpation of the right mid scalp area into the right parietal area. A linear patch of erythematous papules and cloudy  Vesicles noted along the right parietal area. One large occipital lymph nodes noted on the right side.  Assessment: Herpes dermatitis  Plan:  Meds ordered this encounter  Medications  . valACYclovir (VALTREX) 1000 MG tablet    Sig: Take 1 tablet (1,000 mg total) by mouth 3 (three) times daily.    Dispense:  21 tablet    Refill:  2    Order Specific Question:  Supervising Provider    Answer:  Mikey Kirschner [[id number]]  . DISCONTD: gabapentin (NEURONTIN) 100 MG capsule    Sig: Take 2 capsules (200 mg total) by mouth 2 (two) times daily.    Dispense:  120 capsule    Refill:  0    Order Specific Question:  Supervising Provider    Answer:  Mikey Kirschner [[id number]]  . gabapentin (NEURONTIN) 100 MG capsule    Sig: Take 2 capsules (200 mg total) by mouth 2 (two) times daily.    Dispense:  120 capsule    Refill:  0    Order Specific Question:  Supervising Provider    Answer:  Mikey Kirschner [[id number]]     If symptoms recur, recommend patient consider suppressive therapy. Restart gabapentin as directed. Call back in 7-10 days if no improvement, sooner if worse. Reviewed warning signs including eye involvement or lesions on the tip of the  nose. No evidence of this today.

## 2016-01-12 ENCOUNTER — Telehealth: Payer: Self-pay | Admitting: Nurse Practitioner

## 2016-01-12 DIAGNOSIS — L299 Pruritus, unspecified: Secondary | ICD-10-CM

## 2016-01-12 DIAGNOSIS — L309 Dermatitis, unspecified: Secondary | ICD-10-CM

## 2016-01-12 NOTE — Telephone Encounter (Signed)
Patient states that he doesn't have a visible rash under his arms, it is just itching like crazy. As far as the lesions on his head, he states that they are about the same. Not much improvement.

## 2016-01-12 NOTE — Telephone Encounter (Signed)
Patient seen on 12/26/15 for shingles on his head.  He now says up under his under arms, for the past few days, has been itching really bad.  He wants to know if something can be done about this?    [name]

## 2016-01-12 NOTE — Telephone Encounter (Signed)
Is it a patch or a line of bumps? How are the lesions on his head?

## 2016-01-13 NOTE — Telephone Encounter (Signed)
#  1: with all of the problems he has had lately, I recommend a referral to dermatology; #2: for the itching, take loratadine 10 mg in the morning and Benadryl 25 mg at night. Also take Zantac 150 mg each day (this blocks histamine receptors in the stomach). Does he have any topical steroid cream? If not, I can send some in.

## 2016-01-13 NOTE — Telephone Encounter (Signed)
Notified patient that #1: with all of the problems he has had lately, recommend a referral to dermatology; #2: for the itching, take loratadine 10 mg in the morning and Benadryl 25 mg at night. Also take Zantac 150 mg each day (this blocks histamine receptors in the stomach). Patient agreed and verbalized understanding. Patient states he does not need topical steroid cream. Referral in the system.

## 2016-01-14 ENCOUNTER — Encounter: Payer: Self-pay | Admitting: Family Medicine

## 2016-01-19 ENCOUNTER — Other Ambulatory Visit: Payer: Self-pay | Admitting: Nurse Practitioner

## 2016-02-12 ENCOUNTER — Telehealth: Payer: Self-pay

## 2016-02-12 NOTE — Telephone Encounter (Signed)
Pt's wife called and said he has had rectal bleeding for the last week. Bright red blood in the comode when he has a BM.  He is aware that he has hemorrhoids.  Please advise!

## 2016-02-13 MED ORDER — LIDOCAINE-HYDROCORTISONE ACE 3-2.5 % RE KIT: PACK | RECTAL | Status: DC

## 2016-02-13 NOTE — Telephone Encounter (Signed)
PLEASE CALL PT.  DRINK WATER. EAT FIBER. NO ASPIRIN, BC/GOODY POWDERS, IBUPROFEN/MOTRIN, OR NAPROXEN/ALEVE BECAUSE THEY INCREASE HEMORRHOID BLEEDING. HE CAN USE PREPARATION H QID FOR THE NEXT 10 DAYS. AND I WILL ALSO SEND RX TO [facility] FOR HEMORRHOID CREAM HE CAN USE QID FR 14 DAYS. GO TO ED FOR HEAVY BLEEDING. HE HAS NOT BEEN SEEN IN OUR OFFICE FOR  OVER A YEAR.  He needs a follow up appt to assess his symptoms WITHIN THE NEXT MONTH.

## 2016-02-16 NOTE — Telephone Encounter (Signed)
Pt was informed. Said he could not afford the prescription at [facility] for $50.00. I told him to use the prep H and I will let Dr. Oneida Alar know. Forwarding to [facility] to make an appt.

## 2016-02-16 NOTE — Telephone Encounter (Signed)
REVIEWED-NO ADDITIONAL RECOMMENDATIONS. 

## 2016-02-17 ENCOUNTER — Encounter: Payer: Self-pay | Admitting: Gastroenterology

## 2016-02-17 NOTE — Telephone Encounter (Signed)
APPT MADE AND LETTER SENT  °

## 2016-03-08 ENCOUNTER — Encounter: Payer: Self-pay | Admitting: Gastroenterology

## 2016-03-08 ENCOUNTER — Ambulatory Visit (INDEPENDENT_AMBULATORY_CARE_PROVIDER_SITE_OTHER): Payer: BLUE CROSS/BLUE SHIELD | Admitting: Gastroenterology

## 2016-03-08 VITALS — BP 144/82 | HR 78 | Temp 98.8°F | Ht 71.0 in | Wt 254.0 lb

## 2016-03-08 DIAGNOSIS — K625 Hemorrhage of anus and rectum: Secondary | ICD-10-CM

## 2016-03-08 DIAGNOSIS — Z860100 Personal history of colon polyps, unspecified: Secondary | ICD-10-CM | POA: Insufficient documentation

## 2016-03-08 MED ORDER — HYDROCORTISONE 2.5 % RE CREA: 1.0000 "application " | TOPICAL_CREAM | Freq: Two times a day (BID) | RECTAL | Status: DC

## 2016-03-08 NOTE — Patient Instructions (Signed)
Please complete blood work today.   I have sent a rectal cream to your pharmacy to use twice a day for 7 days.   We will see you back in Nov/Dec to arrange a colonoscopy.  If you start bleeding again, we may consider a colonoscopy earlier than next year.

## 2016-03-08 NOTE — Progress Notes (Signed)
Referring Provider: Mikey Kirschner, MD Primary Care Physician:  Mickie Hillier, MD  Primary GI: Dr. Oneida Alar   Chief Complaint  Patient presents with  . Follow-up  . Hemorrhoids    HPI:   [name] is a 30 y.o. male presenting today with a history of multiple gastric polyps on an EGD March 2015 with question of duodenal adenomas involving the major and minor papilla. EUS completed with out clear adenomatous or mass-like appearance. Surveillance EGD up-to-date as of last year. Colonoscopy due in 2018 due to large adenoma removed in 2015. Large internal hemorrhoids on colonoscopy in 2015. Called in to our office with reports of rectal bleeding recently. [facility] cream called in. Never took any cream. States it was 60$. Rectal bleeding a few weeks ago. Lasted for a few weeks. Ceased on its own. No rectal discomfort. In toilet and tissue. Seemed like quite a bit of blood. No abdominal pain. No constipation/diarrhea. Protonix BID and allopurinol BID. No further GI complaints now.    Past Medical History  Diagnosis Date  . Arthritis   . Gout   . Elevated LFTs     mildly elevated transaminases in past, now normalized   . History of kidney stones     Past Surgical History  Procedure Laterality Date  . Kidney stone surgery  age 27 and age 30    lithotripsy, stent  . Wisdom tooth extraction    . Colonoscopy with esophagogastroduodenoscopy (egd) N/A 02/25/2014    NL TI, 2 SIMPLE ADENOMAS(1:>1 CM), LGE IH-FG POLYPS, PROMINENT AMPULLA. Needs Colonoscopy surveillance in 2018  . Eus N/A 03/20/2014    Dr. Paulita Fujita: prominent major papilla s/p biopsy, minor papilla without clear adenomatous or mass-like appearance, chronic duodenitis on path.   . Appendectomy  01/27/2015  . Esophagogastroduodenoscopy N/A 04/29/2015    Dr. Oneida Alar: 1. mild non-erosive gastritis (inflammation) was found in the gastric antrum. 2. Prominent Ampullla 36mmx 83mm. benign path with pyloric metaplasia     Current Outpatient Prescriptions  Medication Sig Dispense Refill  . allopurinol (ZYLOPRIM) 300 MG tablet Take 1 tablet (300 mg total) by mouth 2 (two) times daily. 60 tablet 5  . pantoprazole (PROTONIX) 40 MG tablet TAKE ONE TABLET BY MOUTH TWICE DAILY BEFORE MEALS 60 tablet 5   No current facility-administered medications for this visit.    Allergies as of 03/08/2016 - Review Complete 03/08/2016  Allergen Reaction Noted  . Phenergan [promethazine hcl] Other (See Comments) 03/13/2014    Family History  Problem Relation Age of Onset  . Diabetes Father   . COPD Father   . Colon cancer Neg Hx     does not know paternal side    Social History   Social History  . Marital Status: Married    Spouse Name: N/A  . Number of Children: N/A  . Years of Education: N/A   Occupational History  . Construction    Social History Main Topics  . Smoking status: Never Smoker   . Smokeless tobacco: Never Used  . Alcohol Use: No  . Drug Use: No  . Sexual Activity: Not Asked   Other Topics Concern  . None   Social History Narrative    Review of Systems: As mentioned in HPI.   Physical Exam: BP 144/82 mmHg  Pulse 78  Temp(Src) 98.8 F (37.1 C)  Ht 5\' 11"  (1.803 m)  Wt 254 lb (115.214 kg)  BMI 35.44 kg/m2 General:   Alert and oriented. No distress noted.  Pleasant and cooperative.  Head:  Normocephalic and atraumatic. Eyes:  Conjuctiva clear without scleral icterus. Mouth:  Oral mucosa pink and moist. Good dentition. No lesions. Heart:  S1, S2 present without murmurs, rubs, or gallops. Regular rate and rhythm. Abdomen:  +BS, soft, non-tender and non-distended. No rebound or guarding. No HSM or masses noted. Msk:  Symmetrical without gross deformities. Normal posture. Extremities:  Without edema. Neurologic:  Alert and  oriented x4;  grossly normal neurologically. Psych:  Alert and cooperative. Normal mood and affect.

## 2016-03-08 NOTE — Assessment & Plan Note (Addendum)
Likely hemorrhoid related. Anusol prescribed as [facility] cream too expensive. If any further bleeding, consider early interval colonoscopy now instead of 2018. Surveillance scheduled for 2018 due to known history of adenomas (1 large on colonoscopy). Return in Nov/Dec to arrange barring any further issues. Check CBC now.

## 2016-03-08 NOTE — Assessment & Plan Note (Signed)
Adenomas on colonoscopy, one large. Return in Nov/Dec to arrange colonoscopy.

## 2016-03-08 NOTE — Progress Notes (Signed)
CC'ED TO PCP 

## 2016-03-09 LAB — CBC
HEMATOCRIT: 44.2 % (ref 39.0–52.0)
HEMOGLOBIN: 15.5 g/dL (ref 13.0–17.0)
MCH: 30.8 pg (ref 26.0–34.0)
MCHC: 35.1 g/dL (ref 30.0–36.0)
MCV: 87.9 fL (ref 78.0–100.0)
MPV: 9.1 fL (ref 8.6–12.4)
Platelets: 254 10*3/uL (ref 150–400)
RBC: 5.03 MIL/uL (ref 4.22–5.81)
RDW: 13.2 % (ref 11.5–15.5)
WBC: 6.1 10*3/uL (ref 4.0–10.5)

## 2016-03-09 NOTE — Progress Notes (Signed)
Quick Note:  CBC normal. Continue with current plan. ______

## 2016-03-10 NOTE — Progress Notes (Signed)
Quick Note:  Pt is aware. ______ 

## 2016-03-31 ENCOUNTER — Other Ambulatory Visit: Payer: Self-pay | Admitting: Nurse Practitioner

## 2016-03-31 DIAGNOSIS — R109 Unspecified abdominal pain: Secondary | ICD-10-CM | POA: Diagnosis not present

## 2016-03-31 DIAGNOSIS — N200 Calculus of kidney: Secondary | ICD-10-CM | POA: Diagnosis not present

## 2016-04-01 DIAGNOSIS — K760 Fatty (change of) liver, not elsewhere classified: Secondary | ICD-10-CM | POA: Diagnosis not present

## 2016-04-01 DIAGNOSIS — N200 Calculus of kidney: Secondary | ICD-10-CM | POA: Diagnosis not present

## 2016-04-01 DIAGNOSIS — R300 Dysuria: Secondary | ICD-10-CM | POA: Diagnosis not present

## 2016-04-02 DIAGNOSIS — Z87442 Personal history of urinary calculi: Secondary | ICD-10-CM | POA: Diagnosis not present

## 2016-04-02 DIAGNOSIS — R109 Unspecified abdominal pain: Secondary | ICD-10-CM | POA: Diagnosis not present

## 2016-04-02 DIAGNOSIS — R3129 Other microscopic hematuria: Secondary | ICD-10-CM | POA: Diagnosis not present

## 2016-06-21 ENCOUNTER — Encounter: Payer: Self-pay | Admitting: Nurse Practitioner

## 2016-06-21 ENCOUNTER — Ambulatory Visit (INDEPENDENT_AMBULATORY_CARE_PROVIDER_SITE_OTHER): Payer: BLUE CROSS/BLUE SHIELD | Admitting: Nurse Practitioner

## 2016-06-21 VITALS — BP 126/90 | Temp 98.4°F | Ht 71.0 in | Wt 253.0 lb

## 2016-06-21 DIAGNOSIS — R1011 Right upper quadrant pain: Secondary | ICD-10-CM

## 2016-06-21 DIAGNOSIS — R197 Diarrhea, unspecified: Secondary | ICD-10-CM | POA: Diagnosis not present

## 2016-06-21 LAB — POCT URINALYSIS DIPSTICK
Blood, UA: NEGATIVE
LEUKOCYTES UA: NEGATIVE
Spec Grav, UA: 1.025
pH, UA: 5

## 2016-06-22 ENCOUNTER — Encounter: Payer: Self-pay | Admitting: Nurse Practitioner

## 2016-06-22 NOTE — Progress Notes (Signed)
Subjective:  Presents for complaints of pain on the right side of the abdomen for the past 4 days. No fever. Mid to upper right sided abdominal pain. Slight back pain at times. Currently on Protonix twice a day. Nausea, unassociated with any particular foods but occurs with eating. Occasional vomiting. Having diarrhea for the past few days, 7 times yesterday. No blood in his stool. Regular follow-up with GI specialist most recent visit 03/08/2016. Plans an EGD and colonoscopy repeat by the end of the year. History of kidney stones, states this pain is not the same. No urinary symptoms. No blood in his urine.  Objective:   BP 126/90 mmHg  Temp(Src) 98.4 F (36.9 C) (Oral)  Ht 5\' 11"  (1.803 m)  Wt 253 lb (114.76 kg)  BMI 35.30 kg/m2 NAD. Alert, oriented. Lungs clear. Heart regular rate rhythm. Abdomen obese soft nondistended with active bowel sounds 4. Mild tenderness in the right upper quadrant with less tenderness noted towards the right lower abdomen. Note the patient has had an appendectomy. Last abdominal ultrasound done 10/23/2013. Results for orders placed or performed in visit on 06/21/16  POCT urinalysis dipstick  Result Value Ref Range   Color, UA Yellow    Clarity, UA Clear    Glucose, UA     Bilirubin, UA     Ketones, UA     Spec Grav, UA 1.025    Blood, UA Negative    pH, UA 5.0    Protein, UA     Urobilinogen, UA     Nitrite, UA     Leukocytes, UA Negative Negative     Assessment: RUQ abdominal pain - Plan: US Abdomen Limited RUQ, CBC with Differential/Platelet, Hepatic function panel, Lipase, POCT urinalysis dipstick  Diarrhea, unspecified type  Plan: Lab work and ultrasound pending. Start bowel probiotics. Warning signs reviewed. Patient to call back or go to ED in the meantime if symptoms worsen.

## 2016-06-23 ENCOUNTER — Other Ambulatory Visit: Payer: Self-pay | Admitting: *Deleted

## 2016-06-23 ENCOUNTER — Ambulatory Visit (HOSPITAL_COMMUNITY)
Admission: RE | Admit: 2016-06-23 | Discharge: 2016-06-23 | Disposition: A | Discharge status: Home / Self Care | Payer: BLUE CROSS/BLUE SHIELD | Source: Ambulatory Visit | Attending: Nurse Practitioner | Admitting: Nurse Practitioner

## 2016-06-23 DIAGNOSIS — R938 Abnormal findings on diagnostic imaging of other specified body structures: Secondary | ICD-10-CM | POA: Diagnosis not present

## 2016-06-23 DIAGNOSIS — R109 Unspecified abdominal pain: Secondary | ICD-10-CM

## 2016-06-23 DIAGNOSIS — K760 Fatty (change of) liver, not elsewhere classified: Secondary | ICD-10-CM | POA: Insufficient documentation

## 2016-06-23 DIAGNOSIS — R1011 Right upper quadrant pain: Secondary | ICD-10-CM | POA: Diagnosis not present

## 2016-06-24 LAB — CBC WITH DIFFERENTIAL/PLATELET
BASOS ABS: 0 10*3/uL (ref 0.0–0.2)
Basos: 0 %
EOS (ABSOLUTE): 0.2 10*3/uL (ref 0.0–0.4)
Eos: 3 %
HEMOGLOBIN: 15.6 g/dL (ref 12.6–17.7)
Hematocrit: 44.3 % (ref 37.5–51.0)
Immature Grans (Abs): 0 10*3/uL (ref 0.0–0.1)
Immature Granulocytes: 0 %
LYMPHS ABS: 1.9 10*3/uL (ref 0.7–3.1)
Lymphs: 33 %
MCH: 30.8 pg (ref 26.6–33.0)
MCHC: 35.2 g/dL (ref 31.5–35.7)
MCV: 87 fL (ref 79–97)
MONOCYTES: 8 %
Monocytes Absolute: 0.5 10*3/uL (ref 0.1–0.9)
NEUTROS ABS: 3.1 10*3/uL (ref 1.4–7.0)
Neutrophils: 56 %
Platelets: 211 10*3/uL (ref 150–379)
RBC: 5.07 x10E6/uL (ref 4.14–5.80)
RDW: 13.3 % (ref 12.3–15.4)
WBC: 5.7 10*3/uL (ref 3.4–10.8)

## 2016-06-24 LAB — HEPATIC FUNCTION PANEL
ALK PHOS: 70 IU/L (ref 39–117)
ALT: 72 IU/L — AB (ref 0–44)
AST: 37 IU/L (ref 0–40)
Albumin: 4.6 g/dL (ref 3.5–5.5)
Bilirubin Total: 0.6 mg/dL (ref 0.0–1.2)
Bilirubin, Direct: 0.14 mg/dL (ref 0.00–0.40)
TOTAL PROTEIN: 7 g/dL (ref 6.0–8.5)

## 2016-06-24 LAB — LIPASE: LIPASE: 24 U/L (ref 0–59)

## 2016-06-25 ENCOUNTER — Encounter (HOSPITAL_COMMUNITY): Payer: Self-pay

## 2016-06-25 ENCOUNTER — Encounter (HOSPITAL_COMMUNITY)
Admission: RE | Admit: 2016-06-25 | Discharge: 2016-06-25 | Disposition: A | Discharge status: Home / Self Care | Payer: BLUE CROSS/BLUE SHIELD | Source: Ambulatory Visit | Attending: Family Medicine | Admitting: Family Medicine

## 2016-06-25 DIAGNOSIS — R109 Unspecified abdominal pain: Secondary | ICD-10-CM | POA: Diagnosis not present

## 2016-06-25 DIAGNOSIS — R1011 Right upper quadrant pain: Secondary | ICD-10-CM | POA: Diagnosis not present

## 2016-06-25 MED ORDER — SINCALIDE 5 MCG IJ SOLR
INTRAMUSCULAR | Status: AC
  Administered 2016-06-25: 2.32 ug via INTRAVENOUS
  Filled 2016-06-25: qty 5

## 2016-06-25 MED ORDER — STERILE WATER FOR INJECTION IJ SOLN
INTRAMUSCULAR | Status: AC
  Administered 2016-06-25: 5 mL via INTRAVENOUS
  Filled 2016-06-25: qty 10

## 2016-06-25 MED ORDER — TECHNETIUM TC 99M MEBROFENIN IV KIT
5.0000 | PACK | Freq: Once | INTRAVENOUS | Status: AC | PRN
  Administered 2016-06-25: 5 via INTRAVENOUS

## 2016-06-28 ENCOUNTER — Telehealth: Payer: Self-pay

## 2016-06-28 NOTE — Telephone Encounter (Signed)
Noted  

## 2016-06-28 NOTE — Telephone Encounter (Signed)
Pt called and said he has been having N/V diarrhea. Had Hida scan and Dr. Nicki Reaper wants him to be seen here ASAP. ( Please see Dr. Bary Leriche note, referred to GI due to ejection fraction).  I have scheduled pt an urgent OV appt with Walden Field, NP on 06/29/2016 at 11:30 Am and pt is to be here at 11:15 Am.

## 2016-06-29 ENCOUNTER — Encounter (HOSPITAL_COMMUNITY): Payer: BLUE CROSS/BLUE SHIELD

## 2016-06-29 ENCOUNTER — Encounter: Payer: Self-pay | Admitting: Nurse Practitioner

## 2016-06-29 ENCOUNTER — Ambulatory Visit (INDEPENDENT_AMBULATORY_CARE_PROVIDER_SITE_OTHER): Payer: BLUE CROSS/BLUE SHIELD | Admitting: Nurse Practitioner

## 2016-06-29 ENCOUNTER — Other Ambulatory Visit: Payer: Self-pay

## 2016-06-29 VITALS — BP 154/83 | HR 107 | Temp 98.3°F | Ht 70.0 in | Wt 249.4 lb

## 2016-06-29 DIAGNOSIS — R197 Diarrhea, unspecified: Secondary | ICD-10-CM | POA: Insufficient documentation

## 2016-06-29 DIAGNOSIS — R1031 Right lower quadrant pain: Secondary | ICD-10-CM

## 2016-06-29 DIAGNOSIS — R1013 Epigastric pain: Secondary | ICD-10-CM

## 2016-06-29 DIAGNOSIS — K2100 Gastro-esophageal reflux disease with esophagitis, without bleeding: Secondary | ICD-10-CM

## 2016-06-29 DIAGNOSIS — R1310 Dysphagia, unspecified: Secondary | ICD-10-CM | POA: Insufficient documentation

## 2016-06-29 DIAGNOSIS — G8929 Other chronic pain: Secondary | ICD-10-CM | POA: Insufficient documentation

## 2016-06-29 NOTE — Assessment & Plan Note (Signed)
Noted onset of frequent loose stools associated with his other symptoms. At this point I will check a CBC, CMP, C. difficile by PCR, GI pathogen panel, pancreatic fecal elastase. Continue PPI. Return for follow-up in 2-4 weeks.

## 2016-06-29 NOTE — Assessment & Plan Note (Signed)
GERD symptoms relatively well controlled. His symptoms today he states are different then his typical GERD symptoms. Continue to monitor, continue PPI.

## 2016-06-29 NOTE — Patient Instructions (Signed)
1. Have your labs drawn when you're able to. 2. Keep taking Protonix. 3. We will help you schedule your CT of your abdomen. 4. We will schedule your procedure (EGD with possible dilation) for you. 5. Return for follow-up in 2 weeks.

## 2016-06-29 NOTE — Assessment & Plan Note (Signed)
Noted epigastric and right upper quadrant pain, right upper quadrant ultrasound noted small amount of sludge possible gallbladder polyp. HIDA scan completed which shows 40% ejection fraction is 60 minutes which is borderline normal/abnormal. Given his other symptoms, I will check labs including CBC, CMP, C. difficile, GI pathogen panel, pancreatic fecal elastase. Continue PPI. Early interval upper endoscopy with possible dilation as noted above. I'll also order a CT of the abdomen given his history of prominent ampulla with biopsy showing benign duodenal mucosa with pyloric metaplasia. Return for follow-up in 2-4 weeks.  Biliary colic could explain at least a portion of his symptoms and he may eventually need cholecystectomy. However, I would like to rule out more possible insidious pathology first. We'll likely need referral to surgery.

## 2016-06-29 NOTE — Progress Notes (Signed)
Referring Provider: Mikey Kirschner, MD Primary Care Physician:  Mickie Hillier, MD Primary GI:  Dr. Oneida Alar  Chief Complaint  Patient presents with  . Abdominal Pain    HPI:   [name] is a 30 y.o. male who presents on referral from primary care for right upper quadrant abdominal pain. Last saw PCP 06/21/2016 which point a noted right-sided abdominal pain for 4 days, no fever, occasional back pain. Currently on Protonix twice a day. Nauseated not associated with any particular foods that occurs with eating, no nausea, occasional vomiting. Upper quadrant ultrasound found tiny gallbladder polyp or cholesterolosis associated with sludge. HIDA scan found borderline gallbladder ejection fraction of 40% with normal being greater than 40%. Last seen in our office 03/08/2016 at which point it was noted multiple gastric polyps on EGD March 2015 with questionable duodenal adenomas involving the major and minor papilloma him he was completed without clear adenomatous or masslike appearance. Surveillance EGD up-to-date. Colonoscopy due in 2018 due to large adenoma removed in 2015.  Today he states his symptoms started about 1 weeks ago. Notes dysphagia and after dysphagia his stomach starts hurting. States this does not feel like his typical GERD symptoms. Last EGD 04/2015 and he states he's due for repeat of both TCS and EGD end of this year. Dysphagia symptoms happen "at least once a day." Eventually passes after about an hour. Occasionally regurgitates if he burps. Also with nausea throughout the day. Pain is epigastric and RUQ/right side and also RLQ. Rare hematochezia, no melena. Has a history of hemorrhoids. Also with diarrhea staring about the same time, noted diarrhea 6-8 times a day noted water. No change in diet, recent travel. Denies chest pain, dyspnea, dizziness, lightheadedness, syncope, near syncope. No unintentional weight loss, fever, chills. Denies any other upper or lower GI  symptoms.  Past Medical History  Diagnosis Date  . Arthritis   . Gout   . Elevated LFTs     mildly elevated transaminases in past, now normalized   . History of kidney stones   . GERD (gastroesophageal reflux disease)     Past Surgical History  Procedure Laterality Date  . Kidney stone surgery  age 81 and age 20    lithotripsy, stent  . Wisdom tooth extraction    . Colonoscopy with esophagogastroduodenoscopy (egd) N/A 02/25/2014    NL TI, 2 SIMPLE ADENOMAS(1:>1 CM), LGE IH-FG POLYPS, PROMINENT AMPULLA. Needs Colonoscopy surveillance in 2018  . Eus N/A 03/20/2014    Dr. Paulita Fujita: prominent major papilla s/p biopsy, minor papilla without clear adenomatous or mass-like appearance, chronic duodenitis on path.   . Appendectomy  01/27/2015  . Esophagogastroduodenoscopy N/A 04/29/2015    Dr. Oneida Alar: 1. mild non-erosive gastritis (inflammation) was found in the gastric antrum. 2. Prominent Ampullla 89mmx 11mm. benign path with pyloric metaplasia    Current Outpatient Prescriptions  Medication Sig Dispense Refill  . allopurinol (ZYLOPRIM) 300 MG tablet TAKE ONE TABLET BY MOUTH TWICE DAILY 60 tablet 0  . pantoprazole (PROTONIX) 40 MG tablet TAKE ONE TABLET BY MOUTH TWICE DAILY BEFORE MEALS 60 tablet 5  . tamsulosin (FLOMAX) 0.4 MG CAPS capsule Take 0.4 mg by mouth daily.  3  . hydrocortisone (ANUSOL-HC) 2.5 % rectal cream Place 1 application rectally 2 (two) times daily. (Patient not taking: Reported on 06/21/2016) 30 g 1   No current facility-administered medications for this visit.    Allergies as of 06/29/2016 - Review Complete 06/29/2016  Allergen Reaction Noted  . Phenergan [  promethazine hcl] Other (See Comments) 03/13/2014    Family History  Problem Relation Age of Onset  . Diabetes Father   . COPD Father   . Colon cancer Neg Hx     does not know paternal side    Social History   Social History  . Marital Status: Married    Spouse Name: N/A  . Number of Children: N/A  .  Years of Education: N/A   Occupational History  . Construction    Social History Main Topics  . Smoking status: Never Smoker   . Smokeless tobacco: Never Used  . Alcohol Use: No  . Drug Use: No  . Sexual Activity: Not Asked   Other Topics Concern  . None   Social History Narrative    Review of Systems: General: Negative for anorexia, weight loss, fever, chills, fatigue, weakness. ENT: Negative for hoarseness. CV: Negative for chest pain, angina, palpitations, peripheral edema.  Respiratory: Negative for dyspnea at rest, cough, sputum, wheezing.  GI: See history of present illness. Endo: Negative for unusual weight change.  Heme: Negative for bruising or bleeding.   Physical Exam: BP 154/83 mmHg  Pulse 107  Temp(Src) 98.3 F (36.8 C) (Oral)  Ht 5\' 10"  (1.778 m)  Wt 249 lb 6.4 oz (113.127 kg)  BMI 35.79 kg/m2 General:   Alert and oriented. Pleasant and cooperative. Well-nourished and well-developed.  Head:  Normocephalic and atraumatic. Eyes:  Without icterus, sclera clear and conjunctiva pink.  Ears:  Normal auditory acuity. Mouth:  No deformity or lesions, oral mucosa pink.  Throat/Neck:  Supple, without mass or thyromegaly. Cardiovascular:  S1, S2 present without murmurs appreciated. Normal pulses noted. Extremities without clubbing or edema. Respiratory:  Clear to auscultation bilaterally. No wheezes, rales, or rhonchi. No distress.  Gastrointestinal:  +BS, soft, and non-distended. Mild to moderate epigastric and RUQ TTP. Moderate RLQ TTP. No HSM noted. No guarding or rebound. Rectal:  Deferred  Neurologic:  Alert and oriented x4;  grossly normal neurologically. Psych:  Alert and cooperative. Normal mood and affect. Heme/Lymph/Immune: No excessive bruising noted.    06/29/2016 12:07 PM   Disclaimer: This note was dictated with voice recognition software. Similar sounding words can inadvertently be transcribed and may not be corrected upon review.

## 2016-06-29 NOTE — Progress Notes (Signed)
cc'ed to pcp °

## 2016-06-29 NOTE — Assessment & Plan Note (Signed)
Patient with noted right lower quadrant pain in addition to his epigastric and right upper quadrant pain. He is status post appendectomy and appendicitis is therefore not on the list of differentials. Possibly due to colon infection, constipation, diarrhea, irritable bowel syndrome. We will await labs as ordered above and further address. Return for follow-up in 2-4 weeks.

## 2016-06-29 NOTE — Assessment & Plan Note (Addendum)
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 He is due for surveillance endoscopy at the end of this year but we will bump this up for early surveillance given his symptoms. Return for follow-up in 2-4 weeks.  Proceed with EGD +/- dilation with Dr. Oneida Alar in near future: the risks, benefits, and alternatives have been discussed with the patient in detail. The patient states understanding and desires to proceed.  The patient is not on any anticoagulants, anxiolytics, chronic pain medications, or antidepressants. He does not drink or do drugs. Conscious sedation should be adequate for his procedure as it was for his previous procedure.

## 2016-07-07 ENCOUNTER — Other Ambulatory Visit: Payer: Self-pay

## 2016-07-07 DIAGNOSIS — R1031 Right lower quadrant pain: Secondary | ICD-10-CM

## 2016-07-08 ENCOUNTER — Ambulatory Visit (HOSPITAL_COMMUNITY)
Admission: RE | Admit: 2016-07-08 | Discharge: 2016-07-08 | Disposition: A | Discharge status: Home / Self Care | Payer: BLUE CROSS/BLUE SHIELD | Source: Ambulatory Visit | Attending: Nurse Practitioner | Admitting: Nurse Practitioner

## 2016-07-08 ENCOUNTER — Ambulatory Visit (HOSPITAL_COMMUNITY): Payer: BLUE CROSS/BLUE SHIELD

## 2016-07-08 DIAGNOSIS — R1031 Right lower quadrant pain: Secondary | ICD-10-CM | POA: Diagnosis not present

## 2016-07-08 DIAGNOSIS — N281 Cyst of kidney, acquired: Secondary | ICD-10-CM | POA: Diagnosis not present

## 2016-07-08 DIAGNOSIS — R1011 Right upper quadrant pain: Secondary | ICD-10-CM | POA: Diagnosis not present

## 2016-07-08 MED ORDER — IOPAMIDOL (ISOVUE-300) INJECTION 61%
100.0000 mL | Freq: Once | INTRAVENOUS | Status: AC | PRN
  Administered 2016-07-08: 100 mL via INTRAVENOUS

## 2016-07-08 NOTE — Progress Notes (Signed)
Pt is aware of results. 

## 2016-07-12 ENCOUNTER — Encounter (HOSPITAL_COMMUNITY): Payer: Self-pay | Admitting: *Deleted

## 2016-07-12 ENCOUNTER — Encounter (HOSPITAL_COMMUNITY)
Admission: RE | Disposition: A | Discharge status: Home Health | Payer: Self-pay | Source: Ambulatory Visit | Attending: Gastroenterology

## 2016-07-12 ENCOUNTER — Other Ambulatory Visit: Payer: Self-pay | Admitting: Gastroenterology

## 2016-07-12 ENCOUNTER — Ambulatory Visit (HOSPITAL_COMMUNITY)
Admission: RE | Admit: 2016-07-12 | Discharge: 2016-07-12 | Disposition: A | Discharge status: Home Health | Payer: BLUE CROSS/BLUE SHIELD | Source: Ambulatory Visit | Attending: Gastroenterology | Admitting: Gastroenterology

## 2016-07-12 DIAGNOSIS — Z800 Family history of malignant neoplasm of digestive organs: Secondary | ICD-10-CM | POA: Diagnosis not present

## 2016-07-12 DIAGNOSIS — K219 Gastro-esophageal reflux disease without esophagitis: Secondary | ICD-10-CM | POA: Insufficient documentation

## 2016-07-12 DIAGNOSIS — K319 Disease of stomach and duodenum, unspecified: Secondary | ICD-10-CM | POA: Insufficient documentation

## 2016-07-12 DIAGNOSIS — R197 Diarrhea, unspecified: Secondary | ICD-10-CM | POA: Diagnosis not present

## 2016-07-12 DIAGNOSIS — Z825 Family history of asthma and other chronic lower respiratory diseases: Secondary | ICD-10-CM | POA: Insufficient documentation

## 2016-07-12 DIAGNOSIS — K2970 Gastritis, unspecified, without bleeding: Secondary | ICD-10-CM | POA: Diagnosis not present

## 2016-07-12 DIAGNOSIS — K3189 Other diseases of stomach and duodenum: Secondary | ICD-10-CM

## 2016-07-12 DIAGNOSIS — Z87442 Personal history of urinary calculi: Secondary | ICD-10-CM | POA: Insufficient documentation

## 2016-07-12 DIAGNOSIS — K625 Hemorrhage of anus and rectum: Secondary | ICD-10-CM | POA: Diagnosis not present

## 2016-07-12 DIAGNOSIS — Z79899 Other long term (current) drug therapy: Secondary | ICD-10-CM | POA: Diagnosis not present

## 2016-07-12 DIAGNOSIS — R1310 Dysphagia, unspecified: Secondary | ICD-10-CM | POA: Diagnosis not present

## 2016-07-12 DIAGNOSIS — M109 Gout, unspecified: Secondary | ICD-10-CM | POA: Diagnosis not present

## 2016-07-12 DIAGNOSIS — Z833 Family history of diabetes mellitus: Secondary | ICD-10-CM | POA: Insufficient documentation

## 2016-07-12 DIAGNOSIS — Z888 Allergy status to other drugs, medicaments and biological substances status: Secondary | ICD-10-CM | POA: Insufficient documentation

## 2016-07-12 DIAGNOSIS — M1990 Unspecified osteoarthritis, unspecified site: Secondary | ICD-10-CM | POA: Diagnosis not present

## 2016-07-12 HISTORY — PX: ESOPHAGOGASTRODUODENOSCOPY: SHX5428

## 2016-07-12 HISTORY — PX: SAVORY DILATION: SHX5439

## 2016-07-12 LAB — CBC
HEMATOCRIT: 45.1 % (ref 39.0–52.0)
Hemoglobin: 15.4 g/dL (ref 13.0–17.0)
MCH: 29.7 pg (ref 26.0–34.0)
MCHC: 34.1 g/dL (ref 30.0–36.0)
MCV: 87.1 fL (ref 78.0–100.0)
PLATELETS: 220 10*3/uL (ref 150–400)
RBC: 5.18 MIL/uL (ref 4.22–5.81)
RDW: 12.7 % (ref 11.5–15.5)
WBC: 5.8 10*3/uL (ref 4.0–10.5)

## 2016-07-12 LAB — COMPREHENSIVE METABOLIC PANEL
ALT: 68 U/L — ABNORMAL HIGH (ref 17–63)
AST: 36 U/L (ref 15–41)
Albumin: 4.1 g/dL (ref 3.5–5.0)
Alkaline Phosphatase: 70 U/L (ref 38–126)
Anion gap: 3 — ABNORMAL LOW (ref 5–15)
BILIRUBIN TOTAL: 0.6 mg/dL (ref 0.3–1.2)
BUN: 14 mg/dL (ref 6–20)
CHLORIDE: 108 mmol/L (ref 101–111)
CO2: 24 mmol/L (ref 22–32)
CREATININE: 0.97 mg/dL (ref 0.61–1.24)
Calcium: 8.8 mg/dL — ABNORMAL LOW (ref 8.9–10.3)
Glucose, Bld: 103 mg/dL — ABNORMAL HIGH (ref 65–99)
POTASSIUM: 4.5 mmol/L (ref 3.5–5.1)
Sodium: 135 mmol/L (ref 135–145)
TOTAL PROTEIN: 7 g/dL (ref 6.5–8.1)

## 2016-07-12 SURGERY — EGD (ESOPHAGOGASTRODUODENOSCOPY)
Anesthesia: Moderate Sedation

## 2016-07-12 MED ORDER — MIDAZOLAM HCL 5 MG/5ML IJ SOLN
INTRAMUSCULAR | Status: AC
  Filled 2016-07-12: qty 10

## 2016-07-12 MED ORDER — MIDAZOLAM HCL 5 MG/5ML IJ SOLN
INTRAMUSCULAR | Status: DC | PRN
  Administered 2016-07-12 (×2): 2 mg via INTRAVENOUS
  Administered 2016-07-12: 1 mg via INTRAVENOUS
  Administered 2016-07-12 (×2): 2 mg via INTRAVENOUS

## 2016-07-12 MED ORDER — LIDOCAINE VISCOUS 2 % MT SOLN
OROMUCOSAL | Status: AC
  Filled 2016-07-12: qty 15

## 2016-07-12 MED ORDER — LIDOCAINE VISCOUS 2 % MT SOLN
OROMUCOSAL | Status: DC | PRN
  Administered 2016-07-12: 1 via OROMUCOSAL

## 2016-07-12 MED ORDER — MINERAL OIL PO OIL
TOPICAL_OIL | ORAL | Status: AC
  Filled 2016-07-12: qty 30

## 2016-07-12 MED ORDER — MEPERIDINE HCL 100 MG/ML IJ SOLN
INTRAMUSCULAR | Status: AC
  Filled 2016-07-12: qty 2

## 2016-07-12 MED ORDER — MEPERIDINE HCL 100 MG/ML IJ SOLN
INTRAMUSCULAR | Status: DC | PRN
  Administered 2016-07-12: 25 mg via INTRAVENOUS
  Administered 2016-07-12 (×3): 50 mg via INTRAVENOUS

## 2016-07-12 MED ORDER — STERILE WATER FOR IRRIGATION IR SOLN
Status: DC | PRN
  Administered 2016-07-12: 09:00:00

## 2016-07-12 MED ORDER — SODIUM CHLORIDE 0.9 % IV SOLN
INTRAVENOUS | Status: DC
  Administered 2016-07-12: 08:00:00 via INTRAVENOUS

## 2016-07-12 MED ORDER — ONDANSETRON HCL 4 MG/2ML IJ SOLN
INTRAMUSCULAR | Status: AC
  Filled 2016-07-12: qty 2

## 2016-07-12 MED ORDER — ONDANSETRON HCL 4 MG/2ML IJ SOLN
INTRAMUSCULAR | Status: DC | PRN
  Administered 2016-07-12: 4 mg via INTRAVENOUS

## 2016-07-12 NOTE — H&P (Addendum)
  Primary Care Physician:  Mickie Hillier, MD Primary Gastroenterologist:  Dr. Oneida Alar  Pre-Procedure History & Physical: HPI:  [name] is a 30 y.o. male here for [facility].  Past Medical History:  Diagnosis Date  . Arthritis   . Elevated LFTs    mildly elevated transaminases in past, now normalized   . GERD (gastroesophageal reflux disease)   . Gout   . History of kidney stones     Past Surgical History:  Procedure Laterality Date  . APPENDECTOMY  01/27/2015  . COLONOSCOPY WITH ESOPHAGOGASTRODUODENOSCOPY (EGD) N/A 02/25/2014   NL TI, 2 SIMPLE ADENOMAS(1:>1 CM), LGE IH-FG POLYPS, PROMINENT AMPULLA. Needs Colonoscopy surveillance in 2018  . ESOPHAGOGASTRODUODENOSCOPY N/A 04/29/2015   Dr. Oneida Alar: 1. mild non-erosive gastritis (inflammation) was found in the gastric antrum. 2. Prominent Ampullla 24mmx 65mm. benign path with pyloric metaplasia  . EUS N/A 03/20/2014   Dr. Paulita Fujita: prominent major papilla s/p biopsy, minor papilla without clear adenomatous or mass-like appearance, chronic duodenitis on path.   Marland Kitchen KIDNEY STONE SURGERY  age 63 and age 50   lithotripsy, stent  . WISDOM TOOTH EXTRACTION      Prior to Admission medications   Medication Sig Start Date End Date Taking? Authorizing Provider  allopurinol (ZYLOPRIM) 300 MG tablet TAKE ONE TABLET BY MOUTH TWICE DAILY 03/31/16  Yes Mikey Kirschner, MD  hydrocortisone (ANUSOL-HC) 2.5 % rectal cream Place 1 application rectally 2 (two) times daily. 03/08/16  Yes Orvil Feil, NP  pantoprazole (PROTONIX) 40 MG tablet TAKE ONE TABLET BY MOUTH TWICE DAILY BEFORE MEALS 01/19/16  Yes Mikey Kirschner, MD  tamsulosin (FLOMAX) 0.4 MG CAPS capsule Take 0.4 mg by mouth daily. 03/31/16  Yes Historical Provider, MD    Allergies as of 06/29/2016 - Review Complete 06/29/2016  Allergen Reaction Noted  . Phenergan [promethazine hcl] Other (See Comments) 03/13/2014    Family History  Problem Relation Age of Onset  . Diabetes Father   .  COPD Father   . Colon cancer Neg Hx     does not know paternal side    Social History   Social History  . Marital status: Married    Spouse name: N/A  . Number of children: N/A  . Years of education: N/A   Occupational History  . Press photographer   Social History Main Topics  . Smoking status: Never Smoker  . Smokeless tobacco: Never Used  . Alcohol use No  . Drug use: No  . Sexual activity: Not on file   Other Topics Concern  . Not on file   Social History Narrative  . No narrative on file    Review of Systems: See HPI, otherwise negative ROS   Physical Exam: BP (!) 148/91   Pulse (!) 59   Temp 98.8 F (37.1 C) (Oral)   Resp 13   Ht 5\' 11"  (1.803 m)   Wt 250 lb (113.4 kg)   SpO2 100%   BMI 34.87 kg/m  General:   Alert,  pleasant and cooperative in NAD Head:  Normocephalic and atraumatic. Neck:  Supple; Lungs:  Clear throughout to auscultation.    Heart:  Regular rate and rhythm. Abdomen:  Soft, nontender and nondistended. Normal bowel sounds, without guarding, and without rebound.   Neurologic:  Alert and  oriented x4;  grossly normal neurologically.  Impression/Plan:     DYSPHAGIA/DIARRHEA  PLAN:  EGD/DIL WITH BIOPSY TODAY

## 2016-07-12 NOTE — Op Note (Signed)
[facility] Patient Name: [name] Procedure Date: 07/12/2016 8:30 AM MRN: [id number] Date of Birth: 09/20/1986 Attending MD: Barney Drain , MD CSN: [id number] Age: 30 Admit Type: Outpatient Procedure:                Upper GI endoscopy with ESOPHAGEAL DILATION AND                            COLD FORCEPS BIOPSY Indications:              Dysphagia, Diarrhea for 3 weeks(7-10 daily), NO                            NOCYURNAL STOOLS. OCCASIONAL RECTAL BLEEDING. CT                            ABD/PELVIS W/ IVC JUL 2017-NAIAP Providers:                Barney Drain, MD, Rosina Lowenstein, RN Referring MD:             Rosemary Holms, MD Medicines:                Ondansetron 4 mg IV, Meperidine 175 mg IV,                            Midazolam 9 mg IV Complications:            No immediate complications. Estimated Blood Loss:     Estimated blood loss was minimal. Procedure:                Pre-Anesthesia Assessment:                           - Prior to the procedure, a History and Physical                            was performed, and patient medications and                            allergies were reviewed. The patient's tolerance of                            previous anesthesia was also reviewed. The risks                            and benefits of the procedure and the sedation                            options and risks were discussed with the patient.                            All questions were answered, and informed consent                            was obtained. Prior Anticoagulants: The patient has  taken no previous anticoagulant or antiplatelet                            agents. ASA Grade Assessment: II - A patient with                            mild systemic disease. After reviewing the risks                            and benefits, the patient was deemed in                            satisfactory condition to undergo the procedure.                            After obtaining informed consent, the endoscope was                            passed under direct vision. Throughout the                            procedure, the patient's blood pressure, pulse, and                            oxygen saturations were monitored continuously. The                            Endoscope was introduced through the mouth, and                            advanced to the second part of duodenum. The upper                            GI endoscopy was accomplished without difficulty.                            The patient tolerated the procedure well. Scope In: 9:18:50 AM Scope Out: 9:33:41 AM Total Procedure Duration: 0 hours 14 minutes 51 seconds  Findings:      The examined esophagus was normal. Four biopsies were obtained at 20 and       35 cm from the incisors (GE junction 0 CM FROM INCISORS) with cold       forceps for evaluation of eosinophilic esophagitis. A guidewire was       placed and the scope was withdrawn. Dilation was performed with a Savary       dilator with mild resistance at 16 mm and 17 mm.      RETAINED FOOD IN FUNDUS WHICH COULD NOT BE ASPIRATED. LIMITED EXAM OF       THE MUCOSA. Scattered mild inflammation characterized by congestion       (edema), erosions and erythema was found in the gastric antrum. Biopsies       were taken with a cold forceps for Helicobacter pylori OR EOSINOPHICLIC       GASTRITIS.      Localized nodular mucosa was found in  the ampulla.      The examined duodenum was normal. Biopsies for histology were taken with       a cold forceps for evaluation of celiac disease OR EOSINOPHILIC       DUODENITIS. Impression:               - Normal esophagus. DYSPHAGIA MOST LIKLEY DUE TO                            UNCONTROLELD REFLUX                           - EPIGASTRIC PAIN DUE TO Gastritis/GERD.                           - Nodular mucosa in the ampulla UNCHANGED SINCE                            PRIOR EXAM IN [facility]  2016. Moderate Sedation:      Moderate (conscious) sedation was administered by the endoscopy nurse       and supervised by the endoscopist. The following parameters were       monitored: oxygen saturation, heart rate, blood pressure, and response       to care. Total physician intraservice time was 42 minutes. Recommendation:           - Low fat diet. LOSE 20 POUNDS.                           - Continue present medications. AVOID REFLUX                            TRIGGERS.                           - Await pathology results. CBC/CMP PENDING TODAY.                           - PT ASKED TO SUBMIT STOOL SAMPLE.                           - Return to my office in 4 months.                           - Patient has a contact number available for                            emergencies. The signs and symptoms of potential                            delayed complications were discussed with the                            patient. Return to normal activities tomorrow.                            Written discharge instructions were provided to the  patient. Procedure Code(s):        --- Professional ---                           [phone], Esophagogastroduodenoscopy, flexible,                            transoral; with insertion of guide wire followed by                            passage of dilator(s) through esophagus over guide                            wire                           43239, Esophagogastroduodenoscopy, flexible,                            transoral; with biopsy, single or multiple                           99152, Moderate sedation services provided by the                            same physician or other qualified health care                            professional performing the diagnostic or                            therapeutic service that the sedation supports,                            requiring the presence of an independent trained                             observer to assist in the monitoring of the                            patient's level of consciousness and physiological                            status; initial 15 minutes of intraservice time,                            patient age 65 years or older                           [phone], Moderate sedation services; each additional                            15 minutes intraservice time                           99153, Moderate sedation services; each additional  15 minutes intraservice time Diagnosis Code(s):        --- Professional ---                           K29.70, Gastritis, unspecified, without bleeding                           K31.89, Other diseases of stomach and duodenum                           R13.10, Dysphagia, unspecified                           R19.7, Diarrhea, unspecified CPT copyright 2016 [employer]. All rights reserved. The codes documented in this report are preliminary and upon coder review may  be revised to meet current compliance requirements. Barney Drain, MD Barney Drain, MD 07/12/2016 10:07:15 AM This report has been signed electronically. Number of Addenda: 0

## 2016-07-12 NOTE — Discharge Instructions (Signed)
I dilated your esophagus. I DID NOT SEE A DEFINITE NARROWING IN YOUR esophagus.You have gastritis. I biopsied your esophagus, stomach, and small bowel.   CONTINUE YOUR WEIGHT LOSS EFFORTS. Lose 20 pounds.  AVOID SPICY FOODS, PEPPER. AND HOT SAUCE FOR 5 DAYS.   AVOID FOOD AND DRINKS THAT TRIGGER REFLUX. SEE INFO BELOW.    CONTINUE PROTONIX. TAKE 30 MINUTES PRIOR TO MEALS TWICE DAILY.  YOUR BIOPSY AND LAB RESULTS WILL BE AVAILABLE IN MY CHART AFTER  AUG 3 AND  MY OFFICE WILL CONTACT YOU IN 10-14 DAYS WITH YOUR RESULTS.   FOLLOW UP IN 4 MOS.  UPPER ENDOSCOPY AFTER CARE Read the instructions outlined below and refer to this sheet in the next week. These discharge instructions provide you with general information on caring for yourself after you leave the hospital. While your treatment has been planned according to the most current medical practices available, unavoidable complications occasionally occur. If you have any problems or questions after discharge, call DR. Shylee Durrett, [phone].  ACTIVITY  You may resume your regular activity, but move at a slower pace for the next 24 hours.   Take frequent rest periods for the next 24 hours.   Walking will help get rid of the air and reduce the bloated feeling in your belly (abdomen).   No driving for 24 hours (because of the medicine (anesthesia) used during the test).   You may shower.   Do not sign any important legal documents or operate any machinery for 24 hours (because of the anesthesia used during the test).    NUTRITION  Drink plenty of fluids.   You may resume your normal diet as instructed by your doctor.   Begin with a light meal and progress to your normal diet. Heavy or fried foods are harder to digest and may make you feel sick to your stomach (nauseated).   Avoid alcoholic beverages for 24 hours or as instructed.    MEDICATIONS  You may resume your normal medications.   WHAT YOU CAN EXPECT TODAY  Some  feelings of bloating in the abdomen.   Passage of more gas than usual.    IF YOU HAD A BIOPSY TAKEN DURING THE UPPER ENDOSCOPY:  Eat a soft diet IF YOU HAVE NAUSEA, BLOATING, ABDOMINAL PAIN, OR VOMITING.    FINDING OUT THE RESULTS OF YOUR TEST Not all test results are available during your visit. DR. Oneida Alar WILL CALL YOU WITHIN 14 DAYS OF YOUR PROCEDUE WITH YOUR RESULTS. Do not assume everything is normal if you have not heard from DR. Lendora Keys, CALL HER OFFICE AT [phone].  SEEK IMMEDIATE MEDICAL ATTENTION AND CALL THE OFFICE: [phone] IF:  You have more than a spotting of blood in your stool.   Your belly is swollen (abdominal distention).   You are nauseated or vomiting.   You have a temperature over 101F.   You have abdominal pain or discomfort that is severe or gets worse throughout the day.  Gastritis  Gastritis is an inflammation (the body's way of reacting to injury and/or infection) of the stomach. It is often caused by viral or bacterial (germ) infections. It can also be caused BY ASPIRIN, BC/GOODY POWDER'S, (IBUPROFEN) MOTRIN, OR ALEVE (NAPROXEN), chemicals (including alcohol), SPICY FOODS, and medications. This illness may be associated with generalized malaise (feeling tired, not well), UPPER ABDOMINAL STOMACH cramps, and fever. One common bacterial cause of gastritis is an organism known as H. Pylori. This can be treated with antibiotics.   REFLUX REFLUX  allows stomach acid to flow back into your esophagus, the tube which carries food from your mouth to your stomach. If this acid causes problems it is called GERD (gastro-esophageal reflux disease).   SYMPTOMS Common symptoms of GERD are heartburn (burning in your chest) AND DIFFICULTY SWALLOWING. This is worse when lying down or bending over. It may also cause belching and indigestion. Some of the things which make GERD worse are:  Increased weight pushes on stomach making acid rise more easily.   Smoking  markedly increases acid production.   Alcohol decreases lower esophageal sphincter pressure (valve between stomach and esophagus), allowing acid from stomach into esophagus.   Late evening meals and going to bed with a full stomach increases pressure.   HOME CARE INSTRUCTIONS  Try to achieve and maintain an ideal body weight.   Avoid drinking alcoholic beverages.   DO NOT smokE.   Do not wear tight clothing around your chest or stomach.   Eat smaller meals and eat more frequently. This keeps your stomach from getting too full. Eat slowly.   Do not lie down for 2 or 3 hours after eating. Do not eat or drink anything 1 to 2 hours before going to bed.   Avoid caffeine beverages (colas, coffee, cocoa, tea), fatty foods, citrus fruits and all other foods and drinks that contain acid and that seem to increase the problems.   Avoid bending over, especially after eating OR STRAINING. Anything that increases the pressure in your belly increases the amount of acid that may be pushed up into your esophagus.    DYSPHAGIA DYSPHAGIA can be caused by stomach acid backing up into the tube that carries food from the mouth down to the stomach (lower esophagus).  TREATMENT There are a number of medicines used to treat DYSPHAGIA including: Antacids.  ZANTAC Proton-pump inhibitors: PROTONIX  HOME CARE INSTRUCTIONS Eat 2-3 hours before going to bed.  Try to reach and maintain a healthy weight.  Do not eat just a few very large meals. Instead, eat 4 TO 6 smaller meals throughout the day.  Try to identify foods and beverages that make your symptoms worse, and avoid these.  Avoid tight clothing.  Do not exercise right after eating.

## 2016-07-12 NOTE — Progress Notes (Addendum)
Patient has appointment on August 21 with Walden Field at Dr. Oneida Alar office. Manuela Schwartz from Dr. Oneida Alar office put patient on four month call back list for a follow-up per Dr. Oneida Alar.

## 2016-07-15 ENCOUNTER — Other Ambulatory Visit: Payer: Self-pay | Admitting: Family Medicine

## 2016-07-15 ENCOUNTER — Encounter (HOSPITAL_COMMUNITY): Payer: Self-pay | Admitting: Gastroenterology

## 2016-07-20 ENCOUNTER — Telehealth: Payer: Self-pay | Admitting: Gastroenterology

## 2016-07-20 NOTE — Telephone Encounter (Signed)
Please call pt. HER stomach Bx shows gastritis. THE ESOPHAGUS BIOPSIES ARE NORMAL.  HIS SMALL BOWEL BIOPSIES ARE NORMAL. HIS ABDOMINAL PAIN AND DIARRHEA ARE MOST LIKELY DUE TO REFLUX AND IBS.   CONTINUE YOUR WEIGHT LOSS EFFORTS. Lose 20 pounds.  AVOID FOOD AND DRINKS THAT TRIGGER REFLUX.   CONTINUE PROTONIX. TAKE 30 MINUTES PRIOR TO MEALS TWICE DAILY.  FOLLOW UP IN 4 MOS E30 GASTRITIS/ABDOMINAL PAIN/GERD.

## 2016-07-21 NOTE — Telephone Encounter (Signed)
PT is aware of results.  

## 2016-07-27 DIAGNOSIS — K210 Gastro-esophageal reflux disease with esophagitis: Secondary | ICD-10-CM | POA: Diagnosis not present

## 2016-07-27 DIAGNOSIS — Z79899 Other long term (current) drug therapy: Secondary | ICD-10-CM | POA: Diagnosis not present

## 2016-07-27 DIAGNOSIS — R1013 Epigastric pain: Secondary | ICD-10-CM | POA: Diagnosis not present

## 2016-07-28 ENCOUNTER — Telehealth: Payer: Self-pay | Admitting: Family Medicine

## 2016-07-28 ENCOUNTER — Ambulatory Visit: Payer: BLUE CROSS/BLUE SHIELD | Admitting: Nurse Practitioner

## 2016-07-28 DIAGNOSIS — M109 Gout, unspecified: Secondary | ICD-10-CM

## 2016-07-28 NOTE — Telephone Encounter (Signed)
Notified patient bloodwork ordered and he can still have flare ups even with allopurinol. Please be sure he is taking med and if he wants other med called in for flare up. Patient verbalized understanding. Transferred to front desk to schedule appointment.

## 2016-07-28 NOTE — Telephone Encounter (Signed)
Nurses feel free to put [place] in a same day slot for gout. Also please order uric acid level and Met 7 nonfasting. He can still have flare ups even with allopurinol. Please be sure he is taking med and if he wants other med called in for flare up. Thanks.

## 2016-07-28 NOTE — Telephone Encounter (Signed)
Pt is calling to say he can't come in today due to his job in [place], he needs an early appt or late afternoon to which we do not have at this time.  He has a flare of gout but it is not getting better with the meds that he currently has. He feels he needs BW done before he comes in as well. Please advise   [name]'s drug

## 2016-08-02 ENCOUNTER — Ambulatory Visit (INDEPENDENT_AMBULATORY_CARE_PROVIDER_SITE_OTHER): Payer: BLUE CROSS/BLUE SHIELD | Admitting: Family Medicine

## 2016-08-02 ENCOUNTER — Encounter: Payer: Self-pay | Admitting: Family Medicine

## 2016-08-02 ENCOUNTER — Encounter: Payer: Self-pay | Admitting: Nurse Practitioner

## 2016-08-02 ENCOUNTER — Ambulatory Visit (INDEPENDENT_AMBULATORY_CARE_PROVIDER_SITE_OTHER): Payer: BLUE CROSS/BLUE SHIELD | Admitting: Nurse Practitioner

## 2016-08-02 VITALS — BP 136/84 | Ht 68.0 in | Wt 250.6 lb

## 2016-08-02 VITALS — BP 139/92 | HR 106 | Temp 98.3°F | Ht 68.0 in | Wt 251.4 lb

## 2016-08-02 DIAGNOSIS — K2100 Gastro-esophageal reflux disease with esophagitis, without bleeding: Secondary | ICD-10-CM

## 2016-08-02 DIAGNOSIS — M109 Gout, unspecified: Secondary | ICD-10-CM | POA: Diagnosis not present

## 2016-08-02 DIAGNOSIS — R1310 Dysphagia, unspecified: Secondary | ICD-10-CM | POA: Diagnosis not present

## 2016-08-02 MED ORDER — LIDOCAINE VISCOUS 2 % MT SOLN: 15.0000 mL | Freq: Three times a day (TID) | OROMUCOSAL | 1 refills | Status: DC | PRN

## 2016-08-02 MED ORDER — DEXLANSOPRAZOLE 60 MG PO CPDR: 60.0000 mg | DELAYED_RELEASE_CAPSULE | Freq: Every day | ORAL | 0 refills | Status: DC

## 2016-08-02 MED ORDER — COLCHICINE 0.6 MG PO TABS: 0.6000 mg | ORAL_TABLET | Freq: Three times a day (TID) | ORAL | 0 refills | Status: DC | PRN

## 2016-08-02 NOTE — Progress Notes (Signed)
cc'ed to pcp °

## 2016-08-02 NOTE — Progress Notes (Signed)
   Subjective:    Patient ID: [name], male    DOB: Mar 02, 1986, 30 y.o.   MRN: [id number]  HPI Patient arrives with a flare of gout in left knee ankles and toes  Neg fam hx of gout  Severe pain in left ankle cannot walk, now better and into the knee   history of gouthas not had a chance get blood workyet. Definitely fine flaring up the past couple months. Primarily in the first metatarsal phalangeal joint both feet. Left greater than right. Also extrmelysevere left anklealtough thatisbetter now. Now experiencing severe left knee pain  Review of Systems No headache, no major weight loss or weight gain, no chest pain no back pain abdominal pain no change in bowel habits complete ROS otherwise negative     Objective:   Physical Exam  Alert vitals stable, NAD. Blood pressure good on repeat. HEENT normal. Lungs clear. Heart regular rate and rhythm.  left knee some effusion pain with extension      Assessment & Plan:   impression exacerbation of gou plan appropriate blood work. Add colchicine to allopurinol. Patient states cannot tak prednione WSL

## 2016-08-02 NOTE — Patient Instructions (Signed)
1. Have your blood test done when you're able to. 2. Stop taking Protonix for now. 3. I will be V Dexilant 60 mg pills. Take 1 pill, once a day, 30 minutes before your first meal the day. 4. I sent in a prescription of viscous lidocaine to your pharmacy. Take 10-15 mL 3 times a day as needed. 5. Return for follow-up in 2 months.

## 2016-08-02 NOTE — Assessment & Plan Note (Signed)
Patient with persistent exacerbations of GERD. Protonix helps but does not resolve his symptoms. He was given Carafate by the emergency room which she has not taken yet because he wanted to check with [place] first. Still has daily breakthrough symptoms. At this point we will have him stop Protonix, start Dexon 60 mg once a day. I will send in a prescription of viscous lidocaine to his pharmacy to take 10-15 mL 3 times a day as needed. He admits one to 2 episodes of dark stools, some dizziness which she thinks is related to working outdoors a lot but I will check a CBC to be sure he is not having an upper GI bleed. Return for follow-up in 2 months.

## 2016-08-02 NOTE — Assessment & Plan Note (Signed)
Dysphagia symptoms resolved. Continue to monitor. Return for follow-up in 2 months.

## 2016-08-02 NOTE — Progress Notes (Signed)
Referring Provider: Mikey Kirschner, MD Primary Care Physician:  Mickie Hillier, MD Primary GI:  Dr. Oneida Alar  Chief Complaint  Patient presents with  . Gastroesophageal Reflux    HPI:   [name] is a 30 y.o. male who presents for abdominal pain and dysphagia. The patient last seen in our office on 06/29/2016 for abdominal pain on Protonix twice daily. Right upper quadrant ultrasound prior to visit found tiny gallbladder polyp or gallbladder sludge. HIDA scan with borderline gallbladder EF of 40%. Admitted solid food dysphagia and stomach pain which is "not like my typical GERD symptoms". Daily dysphagia symptoms which self resolves after about an hour, occasional regurgitation, nausea throughout the day. Pain is epigastric and right upper quadrant/right side, noted diarrhea 6-8 times a day. History of prominent ampulla measuring 6 mm x 6 mm on previous endoscopy with biopsy noted to be benign duodenal mucosa with pyloric metaplasia. Labs ordered including CBC, CMP, C. difficile, GI pathogen panel, pancreatic fecal elastase all due to diarrhea. He was also scheduled for upper endoscopy. Labs do not appear to been completed.   CBC and CMP were completed as preop labs noted AST most likely elevated due to fatty liver, otherwise essentially normal. EGD was completed 07/12/2016 and found food retained in the fundus which could not be aspirated and limited the exam of the mucosa of the stomach. Biopsies were taken to evaluate for eosinophilic esophagitis although esophagus appeared normal. Erosions and erythema in the gastric antrum status post biopsy. Localized nodular mucosa in ampulla. Dysphagia deemed most likely due to uncontrolled reflux, epigastric pain due to gastritis/GERD, nodular mucosa of the ampulla unchanged since prior exam in 2016. Recommended he lose 20 pounds on a low-fat diet, continue current medications, avoid reflux triggers, submit stool sample, return for follow-up.  Esophageal biopsies found to be benign squamous mucosa, duodenum biopsy benign small bowel mucosa, stomach biopsy reactive gastropathy without H. pylori.  Today he states he went to the ER at [facility] due to worsening esophageal pain. He was told the pain is likely from his esophageal dilation and/or worsening GERD and was given Carafate tabs which he has not taken because he wanted to talk to [place] first. Has GERD breakthrough with every meal soon prostprandially. Protonix helps, but doesn't cure the problem. No further dysphagia. Persistent nausea which he thinks is from working outdoors. Epigastric burning pain. Denies hematochezia. 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 Some dyspnea and dizziness, typically after working outdoors. Did happen yesterday as well while indoors all day. Denies chest pain, syncope, near syncope. Denies any other upper or lower GI symptoms.  Denies NSAIDs and ASA powders. States he eats mostly grilled meats, works outdoors and sweats a lot, not sure how else to lose weight.   Past Medical History:  Diagnosis Date  . Arthritis   . Elevated LFTs    mildly elevated transaminases in past, now normalized   . GERD (gastroesophageal reflux disease)   . Gout   . History of kidney stones     Past Surgical History:  Procedure Laterality Date  . APPENDECTOMY  01/27/2015  . COLONOSCOPY WITH ESOPHAGOGASTRODUODENOSCOPY (EGD) N/A 02/25/2014   NL TI, 2 SIMPLE ADENOMAS(1:>1 CM), LGE IH-FG POLYPS, PROMINENT AMPULLA. Needs Colonoscopy surveillance in 2018  . ESOPHAGOGASTRODUODENOSCOPY N/A 04/29/2015   Dr. Oneida Alar: 1. mild  non-erosive gastritis (inflammation) was found in the gastric antrum. 2. Prominent Ampullla 28mmx 4mm. benign path with pyloric metaplasia  . ESOPHAGOGASTRODUODENOSCOPY N/A 07/12/2016   Procedure: ESOPHAGOGASTRODUODENOSCOPY (EGD);  Surgeon:  Danie Binder, MD;  [facility];  Service: Endoscopy;  Laterality: N/A;  830  . EUS N/A 03/20/2014   Dr. Paulita Fujita: prominent major papilla s/p biopsy, minor papilla without clear adenomatous or mass-like appearance, chronic duodenitis on path.   Marland Kitchen KIDNEY STONE SURGERY  age 58 and age 13   lithotripsy, stent  . SAVORY DILATION N/A 07/12/2016   Procedure: SAVORY DILATION;  Surgeon: Danie Binder, MD;  Location: [facility];  Service: Endoscopy;  Laterality: N/A;  . WISDOM TOOTH EXTRACTION      Current Outpatient Prescriptions  Medication Sig Dispense Refill  . allopurinol (ZYLOPRIM) 300 MG tablet TAKE ONE TABLET BY MOUTH TWICE DAILY 60 tablet 5  . pantoprazole (PROTONIX) 40 MG tablet TAKE ONE TABLET BY MOUTH TWICE DAILY BEFORE MEALS 60 tablet 5  . sucralfate (CARAFATE) 1 g tablet TAKE ONE TABLET BY MOUTH BEFORE MEALS AND AT BEDTIME  0  . tamsulosin (FLOMAX) 0.4 MG CAPS capsule Take 0.4 mg by mouth daily.  3  . hydrocortisone (ANUSOL-HC) 2.5 % rectal cream Place 1 application rectally 2 (two) times daily. (Patient not taking: Reported on 08/02/2016) 30 g 1   No current facility-administered medications for this visit.     Allergies as of 08/02/2016 - Review Complete 07/12/2016  Allergen Reaction Noted  . Phenergan [promethazine hcl] Other (See Comments) 03/13/2014    Family History  Problem Relation Age of Onset  . Diabetes Father   . COPD Father   . Colon cancer Neg Hx     does not know paternal side    Social History   Social History  . Marital status: Married    Spouse name: N/A  . Number of children: N/A  . Years of education: N/A   Occupational History  . Press photographer   Social History Main Topics  . Smoking status: Never Smoker  . Smokeless tobacco: Never Used  . Alcohol use No  . Drug use: No  . Sexual activity: Not Asked   Other Topics Concern  . None   Social History Narrative  . None    Review of Systems: 10-point ROS  negative except as per HPI.   Physical Exam: BP (!) 139/92   Pulse (!) 106   Temp 98.3 F (36.8 C) (Oral)   Ht 5\' 8"  (1.727 m)   Wt 251 lb 6.4 oz (114 kg)   BMI 38.23 kg/m  General:   Obese male. Alert and oriented. Pleasant and cooperative. Well-nourished and well-developed.  Ears:  Normal auditory acuity. Cardiovascular:  S1, S2 present without murmurs appreciated. Extremities without clubbing or edema. Respiratory:  Clear to auscultation bilaterally. No wheezes, rales, or rhonchi. No distress.  Gastrointestinal:  +BS, rounded but soft, non-tender and non-distended. No HSM noted. No guarding or rebound. No masses appreciated.  Rectal:  Deferred  Musculoskalatal:  Symmetrical without gross deformities. Neurologic:  Alert and oriented x4;  grossly normal neurologically. Psych:  Alert and cooperative. Normal mood and affect. Heme/Lymph/Immune: No excessive bruising noted.    08/02/2016 2:02 PM   Disclaimer: This note was dictated with voice recognition software. Similar sounding words can inadvertently be transcribed and may not be corrected upon review.

## 2016-08-03 LAB — BASIC METABOLIC PANEL
BUN / CREAT RATIO: 9 (ref 9–20)
BUN: 14 mg/dL (ref 6–20)
CALCIUM: 10.1 mg/dL (ref 8.7–10.2)
CHLORIDE: 99 mmol/L (ref 96–106)
CO2: 26 mmol/L (ref 18–29)
CREATININE: 1.54 mg/dL — AB (ref 0.76–1.27)
GFR calc non Af Amer: 60 mL/min/{1.73_m2} (ref >59)
GFR, EST AFRICAN AMERICAN: 69 mL/min/{1.73_m2} (ref >59)
Glucose: 83 mg/dL (ref 65–99)
Potassium: 4.5 mmol/L (ref 3.5–5.2)
Sodium: 144 mmol/L (ref 134–144)

## 2016-08-03 LAB — URIC ACID: Uric Acid: 9.9 mg/dL — ABNORMAL HIGH (ref 3.7–8.6)

## 2016-08-05 MED ORDER — FEBUXOSTAT 80 MG PO TABS: ORAL_TABLET | ORAL | 5 refills | Status: DC

## 2016-08-05 NOTE — Addendum Note (Signed)
Addended by: Launa Grill on: 08/05/2016 02:35 PM   Modules accepted: Orders

## 2016-08-09 ENCOUNTER — Ambulatory Visit: Payer: BLUE CROSS/BLUE SHIELD | Admitting: Nurse Practitioner

## 2016-10-04 ENCOUNTER — Ambulatory Visit (INDEPENDENT_AMBULATORY_CARE_PROVIDER_SITE_OTHER): Payer: BLUE CROSS/BLUE SHIELD | Admitting: Nurse Practitioner

## 2016-10-04 ENCOUNTER — Encounter: Payer: Self-pay | Admitting: Nurse Practitioner

## 2016-10-04 VITALS — BP 124/77 | HR 61 | Temp 98.1°F | Ht 71.0 in | Wt 251.8 lb

## 2016-10-04 DIAGNOSIS — R1084 Generalized abdominal pain: Secondary | ICD-10-CM | POA: Diagnosis not present

## 2016-10-04 DIAGNOSIS — R197 Diarrhea, unspecified: Secondary | ICD-10-CM | POA: Diagnosis not present

## 2016-10-04 DIAGNOSIS — K2100 Gastro-esophageal reflux disease with esophagitis, without bleeding: Secondary | ICD-10-CM

## 2016-10-04 MED ORDER — SUCRALFATE 1 G PO TABS: 1.0000 g | ORAL_TABLET | Freq: Three times a day (TID) | ORAL | 2 refills | Status: DC | PRN

## 2016-10-04 NOTE — Assessment & Plan Note (Signed)
Notes mid abdominal pain, worse when eating but rapidly resolves. At this point given GERD and diarrhea could be GERD, gastritis, esophagitis, irritable bowel syndrome, gastroenteritis. Workup as noted above and below. Return for follow-up at [facility] scheduled visit in approximately 1 month.

## 2016-10-04 NOTE — Assessment & Plan Note (Signed)
The patient notes significant diarrhea for the past few months. Previously he went normal. I will order C. difficile PCR and GI pathogen panel to check for infection. The patient is due for follow-up in about 1 month with Dr. Oneida Alar. Can discuss timeline for repeat colonoscopy and/or endoscopy at that time.

## 2016-10-04 NOTE — Progress Notes (Addendum)
REVIEWED-NO ADDITIONAL RECOMMENDATIONS.  Referring Treg Diemer: Mikey Kirschner, MD Primary Care Physician:  Mickie Hillier, MD Primary GI:  Dr.   Chief Complaint  Patient presents with  . Follow-up    pain in middle abdomen    HPI:   [name] is a 30 y.o. male who presents for mid-abdominal pain. Last seen in our office 08/02/2016. At that point he noted he was in the emergency room at [facility] due to worsening esophageal pain at which point it'll than likely worsening GERD and was given Carafate. Protonix is helping, but not allowing resolution. No further dysphagia at his last visit. Persistent nausea which she thinks is due to working outdoors. He does note epigastric burning pain at that time. Denied NSAIDs and aspirin powders. At that time it was recommended he stop taking Protonix, start taking Dexilant 60 mg daily, viscous lidocaine was provided for symptomatic management as well. CBC due to noted dark stools was ordered. Recommend follow-up in 2 months. It does not appear a CBC was completed.  Recent CT of the abdomen completed 07/08/2016 which noted 8 mm left renal cyst otherwise negative CT the abdomen.  Today he states he has mid-abdominal pain, worse when eating but rapidly resolves. Cannot take Lidocaine as it makes him nauseated. States Dexilant was not helping and stopped taking it, uses the store brand "heart burn pills, not sure what it is." Has a bowel movement about every 2-3 hours, loose, occasional watery. This has been going on for "months." Before that he went normally. Denies chest pain, dyspnea, dizziness, lightheadedness, syncope, near syncope. Denies any other upper or lower GI symptoms.  Colonoscopy Performed 02/25/2014 which found normal mucosa in the terminal ileum, small polyp and large polyp from the sigmoid colon, rectal bleeding due to large sigmoid colon polyp and large internal hemorrhoids. 3 internal hemorrhoid bands were also applied. Surgical  pathology found the polyps to be tubular adenoma. Recommended Givens capsule study following week. endoscopic ultrasound with next 2 weeks to evaluate duodenal adenomas.  EGD at the same time found no source for melena, multiple gastric sessile polyps in the gastric fundus most likely fundic gland polyp, mild nonerosive gastritis, probable duodenal adenomas involving the major and minor papilla. Recommended Givens capsule and a week, endoscopic ultrasound within 2 weeks. The Givens capsule was not approved by insurance.   EUS completed on 03/20/2014 noted prominent major papilla unclear if anatomic variant versus adenomatous mucosa status post biopsy. Minor papilla much less prominent. Surgical pathology found the biopsies to be chronic duodenitis.  No indications as to recommended follow-up timeframe.  Past Medical History:  Diagnosis Date  . Arthritis   . Elevated LFTs    mildly elevated transaminases in past, now normalized   . GERD (gastroesophageal reflux disease)   . Gout   . History of kidney stones     Past Surgical History:  Procedure Laterality Date  . APPENDECTOMY  01/27/2015  . COLONOSCOPY WITH ESOPHAGOGASTRODUODENOSCOPY (EGD) N/A 02/25/2014   NL TI, 2 SIMPLE ADENOMAS(1:>1 CM), LGE IH-FG POLYPS, PROMINENT AMPULLA. Needs Colonoscopy surveillance in 2018  . ESOPHAGOGASTRODUODENOSCOPY N/A 04/29/2015   Dr. Oneida Alar: 1. mild non-erosive gastritis (inflammation) was found in the gastric antrum. 2. Prominent Ampullla 25mmx 49mm. benign path with pyloric metaplasia  . ESOPHAGOGASTRODUODENOSCOPY N/A 07/12/2016   Procedure: ESOPHAGOGASTRODUODENOSCOPY (EGD);  Surgeon: Danie Binder, MD;  [facility];  Service: Endoscopy;  Laterality: N/A;  830  . EUS N/A 03/20/2014   Dr. Paulita Fujita: prominent major  papilla s/p biopsy, minor papilla without clear adenomatous or mass-like appearance, chronic duodenitis on path.   Marland Kitchen KIDNEY STONE SURGERY  age 1 and age 24   lithotripsy, stent  . SAVORY  DILATION N/A 07/12/2016   Procedure: SAVORY DILATION;  Surgeon: Danie Binder, MD;  [facility];  Service: Endoscopy;  Laterality: N/A;  . WISDOM TOOTH EXTRACTION      Current Outpatient Prescriptions  Medication Sig Dispense Refill  . Febuxostat (ULORIC) 80 MG TABS Take 1 tablet by mouth daily. 30 tablet 5  . tamsulosin (FLOMAX) 0.4 MG CAPS capsule Take 0.4 mg by mouth daily.  3  . allopurinol (ZYLOPRIM) 300 MG tablet TAKE ONE TABLET BY MOUTH TWICE DAILY (Patient not taking: Reported on 10/04/2016) 60 tablet 5  . colchicine 0.6 MG tablet Take 1 tablet (0.6 mg total) by mouth 3 (three) times daily as needed (flare of gout). (Patient not taking: Reported on 10/04/2016) 36 tablet 0  . dexlansoprazole (DEXILANT) 60 MG capsule Take 1 capsule (60 mg total) by mouth daily. (Patient not taking: Reported on 10/04/2016) 15 capsule 0  . lidocaine (XYLOCAINE) 2 % solution Use as directed 15 mLs in the mouth or throat 3 (three) times daily as needed for mouth pain. (Patient not taking: Reported on 10/04/2016) 100 mL 1  . sucralfate (CARAFATE) 1 g tablet Take 1 tablet (1 g total) by mouth 3 (three) times daily as needed. 30 tablet 2   No current facility-administered medications for this visit.     Allergies as of 10/04/2016 - Review Complete 10/04/2016  Allergen Reaction Noted  . Phenergan [promethazine hcl] Other (See Comments) 03/13/2014    Family History  Problem Relation Age of Onset  . Diabetes Father   . COPD Father   . Colon cancer Neg Hx     does not know paternal side    Social History   Social History  . Marital status: Married    Spouse name: N/A  . Number of children: N/A  . Years of education: N/A   Occupational History  . Press photographer   Social History Main Topics  . Smoking status: Never Smoker  . Smokeless tobacco: Never Used  . Alcohol use No  . Drug use: No  . Sexual activity: Not Asked   Other Topics Concern  . None   Social  History Narrative  . None    Review of Systems: General: Negative for anorexia, weight loss, fever, chills, fatigue, weakness. ENT: Negative for hoarseness, difficulty swallowing. CV: Negative for chest pain, angina, palpitations, peripheral edema.  Respiratory: Negative for dyspnea at rest, cough, sputum, wheezing.  GI: See history of present illness. Endo: Negative for unusual weight change.  Allergy: Negative for rash or hives.   Physical Exam: BP 124/77   Pulse 61   Temp 98.1 F (36.7 C) (Oral)   Ht 5\' 11"  (1.803 m)   Wt 251 lb 12.8 oz (114.2 kg)   BMI 35.12 kg/m  General:   Alert and oriented. Pleasant and cooperative. Well-nourished and well-developed.  Ears:  Normal auditory acuity. Cardiovascular:  S1, S2 present without murmurs appreciated. Extremities without clubbing or edema. Respiratory:  Clear to auscultation bilaterally. No wheezes, rales, or rhonchi. No distress.  Gastrointestinal:  +BS, soft, non-tender and non-distended. No HSM noted. No guarding or rebound. No masses appreciated.  Rectal:  Deferred  Musculoskalatal:  Symmetrical without gross deformities. Neurologic:  Alert and oriented x4;  grossly normal neurologically. Psych:  Alert and cooperative.  Normal mood and affect. Heme/Lymph/Immune: No excessive bruising noted.    10/04/2016 1:42 PM   Disclaimer: This note was dictated with voice recognition software. Similar sounding words can inadvertently be transcribed and may not be corrected upon review.

## 2016-10-04 NOTE — Progress Notes (Signed)
cc'ed to pcp °

## 2016-10-04 NOTE — Assessment & Plan Note (Signed)
The patient notes worsening GERD symptoms. He was previously on Dexilant which she stopped taking, cannot take lidocaine as it makes him nauseated. He started taking "some over-the-counter acid pills but I can't murmur whether called and I usually after take 2 of them to help." At this point recommend restarting Dexilant 60 mg a day. Can also trial Carafate for breakthrough symptoms being that he is unable to tolerate lidocaine. He has a follow-up visit in 1 month aren't scheduled.

## 2016-10-04 NOTE — Patient Instructions (Signed)
1. Start taking Dexilant again. Take 60 mg pill once a day. We can provide you with samples to last a couple weeks and a co-pay card. Call let us know if it works. 2. I will also send in Carafate to your pharmacy. You can crush this polyp and mix it into a slurry with a little bit of water and drink it for worsening symptoms. 3. Keep your follow-up appointment in 1 month. 4. Collect stool samples and bring them to the lab. We will call you when receive the results. 5. Call with any questions or problems.

## 2016-12-17 ENCOUNTER — Other Ambulatory Visit: Payer: Self-pay | Admitting: Nurse Practitioner

## 2016-12-17 ENCOUNTER — Encounter: Payer: Self-pay | Admitting: Nurse Practitioner

## 2016-12-17 DIAGNOSIS — M1A9XX0 Chronic gout, unspecified, without tophus (tophi): Secondary | ICD-10-CM

## 2016-12-17 DIAGNOSIS — Z79899 Other long term (current) drug therapy: Secondary | ICD-10-CM

## 2016-12-17 MED ORDER — ALLOPURINOL 300 MG PO TABS: 300.0000 mg | ORAL_TABLET | Freq: Two times a day (BID) | ORAL | 2 refills | Status: DC

## 2016-12-23 ENCOUNTER — Telehealth: Payer: Self-pay | Admitting: Gastroenterology

## 2016-12-23 DIAGNOSIS — Z86010 Personal history of colonic polyps: Secondary | ICD-10-CM | POA: Diagnosis not present

## 2016-12-23 DIAGNOSIS — K625 Hemorrhage of anus and rectum: Secondary | ICD-10-CM | POA: Diagnosis not present

## 2016-12-23 NOTE — Telephone Encounter (Signed)
Patient called for appointment and was made one for jan 25th.  Stated he could not wait until then, was having bleeding, and stated dr. Britta Mccreedy was going to see him in [place].  He came in and picked up his last office note and his tcs report.

## 2016-12-23 NOTE — Telephone Encounter (Signed)
I received a release for records on patient. He has OV today with Dr Anthony Sar. Patient picked up records earlier this morning, but I also forwarded the last OV note and procedures/path to their office.

## 2016-12-23 NOTE — Telephone Encounter (Signed)
PLEASE CALL PT. HE WILL NEED TO SEE DR. Britta Mccreedy FOR FUTURE APPOINTMENTS.

## 2016-12-27 DIAGNOSIS — K625 Hemorrhage of anus and rectum: Secondary | ICD-10-CM | POA: Diagnosis not present

## 2016-12-27 DIAGNOSIS — M109 Gout, unspecified: Secondary | ICD-10-CM | POA: Diagnosis not present

## 2016-12-27 DIAGNOSIS — D490 Neoplasm of unspecified behavior of digestive system: Secondary | ICD-10-CM | POA: Diagnosis not present

## 2016-12-27 DIAGNOSIS — Z87442 Personal history of urinary calculi: Secondary | ICD-10-CM | POA: Diagnosis not present

## 2016-12-27 DIAGNOSIS — Z86010 Personal history of colonic polyps: Secondary | ICD-10-CM | POA: Diagnosis not present

## 2016-12-28 ENCOUNTER — Encounter: Payer: Self-pay | Admitting: General Practice

## 2016-12-28 NOTE — Telephone Encounter (Signed)
I will mail the patient a discharge letter

## 2017-01-06 ENCOUNTER — Ambulatory Visit: Payer: BLUE CROSS/BLUE SHIELD | Admitting: Gastroenterology

## 2017-01-19 ENCOUNTER — Telehealth: Payer: Self-pay | Admitting: Gastroenterology

## 2017-01-19 NOTE — Telephone Encounter (Signed)
Correction: he went to see Dr. Britta Mccreedy, please see telephone encounter from 12/23/16

## 2017-01-19 NOTE — Telephone Encounter (Signed)
Pt called to say that he didn't understand why he received a discharge letter from [place] and would like to know why he was discharge. I told him that he came to sign a release to send his records to Dr Anthony Sar because he couldn't wait to be seen by [place]. We understood that he was transferring care,but patient said that he wasn't transferring care that he just needed to be seen sooner than what we were offering him. Please call him at [phone]

## 2017-01-20 NOTE — Telephone Encounter (Signed)
He stated he will speak with Dr. Wolfgang Phoenix and have him refer him to a GI doctor in [place]

## 2017-01-20 NOTE — Telephone Encounter (Signed)
Patient is aware 

## 2017-01-20 NOTE — Telephone Encounter (Signed)
PLEASE CALL PT. EXPLAIN TO HIM WHEN HE TRANSFERRED HIS CARE TO DR. Britta Mccreedy. HE TERMINATED THE RELATIONSHIP WITH DR. FIELDS. OUR LETTER WAS DRAFTED AND SENT BECAUSE HE SIGNED AND RELEASED HIS RECORDS AND SAW ANOTHER LOCAL GI DOCTOR.

## 2017-02-15 DIAGNOSIS — Z79899 Other long term (current) drug therapy: Secondary | ICD-10-CM | POA: Diagnosis not present

## 2017-02-15 DIAGNOSIS — M1A9XX0 Chronic gout, unspecified, without tophus (tophi): Secondary | ICD-10-CM | POA: Diagnosis not present

## 2017-02-16 ENCOUNTER — Encounter: Payer: Self-pay | Admitting: Nurse Practitioner

## 2017-02-16 LAB — BASIC METABOLIC PANEL
BUN / CREAT RATIO: 13 (ref 9–20)
BUN: 14 mg/dL (ref 6–20)
CHLORIDE: 101 mmol/L (ref 96–106)
CO2: 25 mmol/L (ref 18–29)
CREATININE: 1.1 mg/dL (ref 0.76–1.27)
Calcium: 9.8 mg/dL (ref 8.7–10.2)
GFR calc Af Amer: 104 mL/min/{1.73_m2} (ref >59)
GFR calc non Af Amer: 90 mL/min/{1.73_m2} (ref >59)
GLUCOSE: 87 mg/dL (ref 65–99)
Potassium: 4.2 mmol/L (ref 3.5–5.2)
Sodium: 143 mmol/L (ref 134–144)

## 2017-02-16 LAB — URIC ACID: URIC ACID: 6.4 mg/dL (ref 3.7–8.6)

## 2017-02-24 ENCOUNTER — Encounter: Payer: Self-pay | Admitting: Nurse Practitioner

## 2017-02-24 ENCOUNTER — Ambulatory Visit (INDEPENDENT_AMBULATORY_CARE_PROVIDER_SITE_OTHER): Payer: BLUE CROSS/BLUE SHIELD | Admitting: Nurse Practitioner

## 2017-02-24 VITALS — BP 140/90 | Temp 98.3°F | Ht 68.0 in | Wt 260.0 lb

## 2017-02-24 DIAGNOSIS — M25562 Pain in left knee: Secondary | ICD-10-CM | POA: Diagnosis not present

## 2017-02-24 MED ORDER — MELOXICAM 15 MG PO TABS: 15.0000 mg | ORAL_TABLET | Freq: Every day | ORAL | 0 refills | Status: DC

## 2017-02-25 ENCOUNTER — Encounter: Payer: Self-pay | Admitting: Nurse Practitioner

## 2017-02-25 NOTE — Progress Notes (Signed)
Subjective:  Presents for complaints of persistent left knee pain, long-term but gradually worse over the past month. His gout symptoms have resolved, under good control with allopurinol. Works in Building control surveyor. Knee pain worse with kneeling or squatting. No difficulty walking. No edema. No history of injury. Reflux has been stable on Protonix.  Objective:   BP 140/90   Temp 98.3 F (36.8 C) (Oral)   Ht 5\' 8"  (1.727 m)   Wt 260 lb (117.9 kg)   BMI 39.53 kg/m  NAD. Alert, oriented. Left knee minimal edema, no erythema or warmth. Localized tenderness noted on the anterior medial knee area just below the patella. Normal range of motion of the knee without tenderness. No joint laxity or crepitus noted.  Assessment:  Acute pain of left knee    Plan:   Meds ordered this encounter  Medications  . meloxicam (MOBIC) 15 MG tablet    Sig: Take 1 tablet (15 mg total) by mouth daily.    Dispense:  30 tablet    Refill:  0    Order Specific Question:   Supervising Provider    Answer:   Mikey Kirschner [[id number]]   Take mobic with food, DC if any acid reflux or stomach upset. Refer to orthopedics for evaluation. Pain is most likely related to a ligament issue. Recheck here as needed.

## 2017-02-28 DIAGNOSIS — M25562 Pain in left knee: Secondary | ICD-10-CM | POA: Diagnosis not present

## 2017-05-09 DIAGNOSIS — R1011 Right upper quadrant pain: Secondary | ICD-10-CM | POA: Diagnosis not present

## 2017-05-09 DIAGNOSIS — Z79899 Other long term (current) drug therapy: Secondary | ICD-10-CM | POA: Diagnosis not present

## 2017-05-09 DIAGNOSIS — R1013 Epigastric pain: Secondary | ICD-10-CM | POA: Diagnosis not present

## 2017-05-09 DIAGNOSIS — R935 Abnormal findings on diagnostic imaging of other abdominal regions, including retroperitoneum: Secondary | ICD-10-CM | POA: Diagnosis not present

## 2017-05-09 DIAGNOSIS — M109 Gout, unspecified: Secondary | ICD-10-CM | POA: Diagnosis not present

## 2017-05-09 DIAGNOSIS — K824 Cholesterolosis of gallbladder: Secondary | ICD-10-CM | POA: Diagnosis not present

## 2017-05-09 DIAGNOSIS — K219 Gastro-esophageal reflux disease without esophagitis: Secondary | ICD-10-CM | POA: Diagnosis not present

## 2017-05-09 DIAGNOSIS — R948 Abnormal results of function studies of other organs and systems: Secondary | ICD-10-CM | POA: Diagnosis not present

## 2017-05-09 DIAGNOSIS — Z87442 Personal history of urinary calculi: Secondary | ICD-10-CM | POA: Diagnosis not present

## 2017-05-10 DIAGNOSIS — K828 Other specified diseases of gallbladder: Secondary | ICD-10-CM | POA: Diagnosis not present

## 2017-05-11 DIAGNOSIS — K8012 Calculus of gallbladder with acute and chronic cholecystitis without obstruction: Secondary | ICD-10-CM | POA: Diagnosis not present

## 2017-05-11 DIAGNOSIS — K828 Other specified diseases of gallbladder: Secondary | ICD-10-CM | POA: Diagnosis not present

## 2017-05-11 DIAGNOSIS — M109 Gout, unspecified: Secondary | ICD-10-CM | POA: Diagnosis not present

## 2017-05-11 DIAGNOSIS — Z87442 Personal history of urinary calculi: Secondary | ICD-10-CM | POA: Diagnosis not present

## 2017-05-11 DIAGNOSIS — E669 Obesity, unspecified: Secondary | ICD-10-CM | POA: Diagnosis not present

## 2017-05-11 DIAGNOSIS — Z79899 Other long term (current) drug therapy: Secondary | ICD-10-CM | POA: Diagnosis not present

## 2017-05-11 DIAGNOSIS — K219 Gastro-esophageal reflux disease without esophagitis: Secondary | ICD-10-CM | POA: Diagnosis not present

## 2017-05-11 DIAGNOSIS — Z888 Allergy status to other drugs, medicaments and biological substances status: Secondary | ICD-10-CM | POA: Diagnosis not present

## 2017-05-11 DIAGNOSIS — Z6835 Body mass index (BMI) 35.0-35.9, adult: Secondary | ICD-10-CM | POA: Diagnosis not present

## 2017-05-17 ENCOUNTER — Ambulatory Visit (INDEPENDENT_AMBULATORY_CARE_PROVIDER_SITE_OTHER): Payer: BLUE CROSS/BLUE SHIELD | Admitting: Nurse Practitioner

## 2017-05-17 ENCOUNTER — Encounter: Payer: Self-pay | Admitting: Nurse Practitioner

## 2017-05-17 VITALS — BP 118/80 | Temp 99.1°F | Ht 68.0 in | Wt 247.0 lb

## 2017-05-17 DIAGNOSIS — J329 Chronic sinusitis, unspecified: Secondary | ICD-10-CM | POA: Diagnosis not present

## 2017-05-17 MED ORDER — AZITHROMYCIN 250 MG PO TABS: ORAL_TABLET | ORAL | 0 refills | Status: DC

## 2017-05-17 NOTE — Patient Instructions (Signed)
Align Nasacort or Rhinocort claritin or Human resources officer

## 2017-05-19 ENCOUNTER — Encounter: Payer: Self-pay | Admitting: Nurse Practitioner

## 2017-05-19 NOTE — Progress Notes (Signed)
Subjective:  Presents for complaints of head: Began 4 days ago. Persistent low-grade fever of 99. Sore throat. Slight headache. Minimal cough and runny nose. No wheezing but slight chest tightness with deep breath and cough. Now producing green mucus. Patient had a recent cholecystectomy. According to patient he had a mild infection. Has been on several days of Keflex. Slight loose stools. No vomiting. Minimal abdominal pain mainly at laparoscopic surgical sites. Taking fluids well. Voiding normal limit. Has had some localized right low back pain that began 3 days ago. Slight pain with deep breath. Does not radiate. No shortness of breath. No edema.  Objective:   BP 118/80   Temp 99.1 F (37.3 C) (Oral)   Ht 5\' 8"  (1.727 m)   Wt 247 lb (112 kg)   BMI 37.56 kg/m  NAD. Alert, oriented. TMs clear effusion, no erythema. Pharynx mildly erythematous with green PND noted. Neck supple with mild soft nontender anterior adenopathy. Lungs clear. Heart regular rate rhythm. Abdomen mildly obese soft nondistended with several bandages covering surgical sites. One linear area uncovered near the umbilicus, no obvious infection. Minimal tenderness to palpation of the abdomen. Indicates right upper lumbar area as area of his discomfort. Minimal tenderness to palpation. Gait normal limit.  Assessment:  Rhinosinusitis    Plan:   Meds ordered this encounter  Medications  . cephALEXin (KEFLEX) 500 MG capsule    Sig: Take 500 mg by mouth 3 (three) times daily.    Refill:  0  . azithromycin (ZITHROMAX Z-PAK) 250 MG tablet    Sig: Take 2 tablets (500 mg) on  Day 1,  followed by 1 tablet (250 mg) once daily on Days 2 through 5.    Dispense:  6 each    Refill:  0    Order Specific Question:   Supervising Provider    Answer:   Mikey Kirschner [[id number]]   Continue Keflex as directed. Add Z-Pak.  Recommend bowel probiotic such as align. Steroid nasal spray and antihistamine as directed. At this point pain is  nonspecific. Patient states it has greatly improved over the past couple of days so continue to monitor and call office if no improvement by the end of the week, sooner if worse. Warning signs were reviewed.

## 2017-07-06 ENCOUNTER — Other Ambulatory Visit: Payer: Self-pay | Admitting: Nurse Practitioner

## 2017-07-12 ENCOUNTER — Other Ambulatory Visit: Payer: Self-pay | Admitting: Family Medicine

## 2017-07-14 ENCOUNTER — Encounter: Payer: Self-pay | Admitting: Nurse Practitioner

## 2017-07-14 ENCOUNTER — Ambulatory Visit (INDEPENDENT_AMBULATORY_CARE_PROVIDER_SITE_OTHER): Payer: BLUE CROSS/BLUE SHIELD | Admitting: Nurse Practitioner

## 2017-07-14 VITALS — BP 128/86 | Ht 68.0 in | Wt 252.6 lb

## 2017-07-14 DIAGNOSIS — T63441A Toxic effect of venom of bees, accidental (unintentional), initial encounter: Secondary | ICD-10-CM

## 2017-07-14 MED ORDER — HYDROXYZINE HCL 25 MG PO TABS: ORAL_TABLET | ORAL | 0 refills | Status: DC

## 2017-07-14 MED ORDER — TAMSULOSIN HCL 0.4 MG PO CAPS: 0.4000 mg | ORAL_CAPSULE | Freq: Every day | ORAL | 2 refills | Status: DC

## 2017-07-14 NOTE — Patient Instructions (Signed)
Loratadine 10 mg in the morning Atarax (hydroxyzine) at night Pepcid or Zantac

## 2017-07-15 ENCOUNTER — Encounter: Payer: Self-pay | Admitting: Nurse Practitioner

## 2017-07-15 NOTE — Progress Notes (Signed)
Subjective:  Presents for complaints of a bee sting on his right hand that occurred yesterday morning. States he was working at his job and put his hand under a piece of equipment felt a stinging and then several bees flew out from under the equipment. Had gradual swelling in the hand area which is slightly improved today. No fevers. No difficulty breathing or swallowing. No wheezing. Has had some localized reactions to bee stings in the past. No history of anaphylaxis. Area of the sting on his finger opened up yesterday and clear liquid was expressed. Swelling has improved since then.  Objective:   BP 128/86   Ht 5\' 8"  (1.727 m)   Wt 252 lb 9.6 oz (114.6 kg)   BMI 38.41 kg/m  NAD. Alert, oriented. Moderate edema on the dorsal aspect of his right hand with minimal erythema. A tiny area is noted on his finger from the bee sting, minimally tender to palpation. No evidence of infection. Strong radial pulse. Skin warm.  Assessment:  Bee sting, accidental or unintentional, initial encounter    Plan:  Loratadine in the morning, Benadryl at night with Pepcid. Expect gradual resolution. Reviewed first aid for bee stings in the future. Call back next week if not significantly improved, sooner if worse.

## 2017-11-29 DIAGNOSIS — M542 Cervicalgia: Secondary | ICD-10-CM | POA: Diagnosis not present

## 2017-11-29 DIAGNOSIS — M545 Low back pain: Secondary | ICD-10-CM | POA: Diagnosis not present

## 2017-11-29 DIAGNOSIS — M9901 Segmental and somatic dysfunction of cervical region: Secondary | ICD-10-CM | POA: Diagnosis not present

## 2017-11-29 DIAGNOSIS — M9903 Segmental and somatic dysfunction of lumbar region: Secondary | ICD-10-CM | POA: Diagnosis not present

## 2017-12-02 DIAGNOSIS — M9903 Segmental and somatic dysfunction of lumbar region: Secondary | ICD-10-CM | POA: Diagnosis not present

## 2017-12-02 DIAGNOSIS — M542 Cervicalgia: Secondary | ICD-10-CM | POA: Diagnosis not present

## 2017-12-02 DIAGNOSIS — M545 Low back pain: Secondary | ICD-10-CM | POA: Diagnosis not present

## 2017-12-02 DIAGNOSIS — M9901 Segmental and somatic dysfunction of cervical region: Secondary | ICD-10-CM | POA: Diagnosis not present

## 2017-12-08 ENCOUNTER — Telehealth: Payer: Self-pay | Admitting: *Deleted

## 2017-12-08 DIAGNOSIS — D499 Neoplasm of unspecified behavior of unspecified site: Secondary | ICD-10-CM

## 2017-12-08 NOTE — Telephone Encounter (Signed)
Pt wants to go ahead with referral to Vicente Males at [facility]. He states it is for a tumor he has that they have been following. Not having problems now. He just wants to do follow up because he has not been seen in awhile.

## 2017-12-09 NOTE — Telephone Encounter (Signed)
Referral put in.

## 2017-12-15 ENCOUNTER — Encounter: Payer: Self-pay | Admitting: Family Medicine

## 2017-12-20 ENCOUNTER — Telehealth (INDEPENDENT_AMBULATORY_CARE_PROVIDER_SITE_OTHER): Payer: Self-pay | Admitting: Internal Medicine

## 2017-12-20 NOTE — Telephone Encounter (Signed)
[name], this patient is in my referral workqueue to [place] for a tumor.  He was seen as [facility] last on 10/04/16.  Please review and advise.

## 2017-12-20 NOTE — Telephone Encounter (Signed)
I needs to see notes on what kind of tumor before we see patient.

## 2017-12-22 NOTE — Telephone Encounter (Signed)
[name], this is the response from [place] at Dr. Lance Sell office regarding the referral to [place] for this patient.  [name], Brendale E  Surrett, [name]        All procedure notes, OV notes, test results are in chart from [facility]. EGD 07/12/2016 & phone message 07/20/2016 = biopsies were normal, pt with GERD  (hope this helps)

## 2017-12-27 ENCOUNTER — Encounter (INDEPENDENT_AMBULATORY_CARE_PROVIDER_SITE_OTHER): Payer: Self-pay | Admitting: Internal Medicine

## 2017-12-27 NOTE — Telephone Encounter (Signed)
Patient was given an appointment for 01/12/18 at 9:00 with Deberah Castle, NP.  A letter was mailed to the patient.

## 2017-12-27 NOTE — Telephone Encounter (Signed)
Hope, he was discharged from [facility].

## 2017-12-27 NOTE — Telephone Encounter (Signed)
[name], please advise on this so I can handle the referral before Friday.

## 2018-01-12 ENCOUNTER — Ambulatory Visit (INDEPENDENT_AMBULATORY_CARE_PROVIDER_SITE_OTHER): Payer: BLUE CROSS/BLUE SHIELD | Admitting: Internal Medicine

## 2018-01-12 ENCOUNTER — Encounter (INDEPENDENT_AMBULATORY_CARE_PROVIDER_SITE_OTHER): Payer: Self-pay | Admitting: Internal Medicine

## 2018-01-12 VITALS — BP 134/84 | HR 68 | Temp 98.3°F | Ht 70.0 in | Wt 251.3 lb

## 2018-01-12 DIAGNOSIS — K319 Disease of stomach and duodenum, unspecified: Secondary | ICD-10-CM

## 2018-01-12 NOTE — Patient Instructions (Signed)
Will discuss with Dr.Rehman. 

## 2018-01-12 NOTE — Progress Notes (Signed)
Subjective:    Patient ID: [name], male    DOB: 1986-01-12, 32 y.o.   MRN: [id number]  HPI Referred by Dr. Wolfgang Phoenix for papillary near his duodenum. He states he has this area looked at every so often. Previous patient of Dr. Oneida Alar. Last EGD was in 2017.  He tells me he is doing good. No weight loss. Appetite is good. He has no GI complaints. His BMs are normal.  No rectal bleeding. Stools are nice and formed.   He also tells me he had a colonoscopy last year  by Dr. Anthony Sar (rectal bleeding, abdominal pain) Normal.  (copy to be scanned)     07/12/2016 EGD/ED Dysphagia. Diarrhea x 3 weeks.   Impression:               - Normal esophagus. DYSPHAGIA MOST LIKLEY DUE TO                            UNCONTROLELD REFLUX                           - EPIGASTRIC PAIN DUE TO Gastritis/GERD.                           - Nodular mucosa in the ampulla UNCHANGED SINCE                            PRIOR EXAM IN [facility] 2016.    04/29/2015 EGD: EGD with biopsy Enlarge Ampulla on EGD/ED.  ENDOSCOPIC IMPRESSION: 1.   MILD Non-erosive gastritis (inflammation) was found in the gastric antrum 3.   PROMINENT AMPULLA   6 MM x 6 MM   03/20/2014 EGD with biopsies, upper EUS: Ampullary lesion. Dr. Paulita Fujita The ampulla appears prominent, about 10 x 12 mm in size, no obvious mass like appearance, no invasion into pancreas or neighboring structures. Some benign appearing periportal lymph nodes.  Biopsy: Chronic duodenitis, consistent with peptic duodenitis. No villous atrophy, H. Pyylori, or malignancy identified.    02/25/2014 EGD with biopsy:  No source for melena identified. Multiple gastric sessile polyp in the gastric fundus. Most likely fundic gland polyps. Mild non erosive gastritis. Probable duodenal adenomas involving the major and minor papilla.  Biopsy: . Stomach, biopsy - UNREMARKABLE GASTRIC MUCOSA. NO HELICOBACTER PYLORI, DYSPLASIA, OR MALIGNANCY.   Stomach, polyp(s) - BENIGN FUNDIC GLAND  POLYPS. NO HELICOBACTER PYLORI ADENOMATOUS CHANGE OR MALIGNANCY.     02/25/2014 Colonoscopy:  ENDOSCOPIC IMPRESSION: 1.   Normal mucosa in the terminal ileum 2.   ONE SMALL polyp AND ONE LARGE POLYP REMOVED FROM the sigmoid colon 3.   RECTAL BLEEDING due to large sigmoid colon polyp and Large internal hemorrhoids  Biopsy: Tubular adenoma.       GB removed 2018 Dr. Anthony Sar. Appendectomy in 2017. 07/08/2016 CT abdomen/pelvis with CM: Rt upper quadrant/epigastric pain, nausea, hx of papillary metaplasia. IMPRESSION: 8 mm left renal cyst. Otherwise negative CT abdomen. Please note that the pelvis was not imaged.  07/08/2016 US abdomen/RUQ: RUQ pain IMPRESSION: Tiny gallbladder polyp or cholesterolosis associated with small amount of sludge. No sonographic evidence of acute cholecystitis.   Hepatic steatosis which has been previously described.   Review of Systems Past Medical History:  Diagnosis Date  . Arthritis   . Elevated LFTs    mildly elevated transaminases in  past, now normalized   . GERD (gastroesophageal reflux disease)   . Gout   . History of kidney stones         Current Outpatient Medications on File Prior to Visit  Medication Sig Dispense Refill  . pantoprazole (PROTONIX) 40 MG tablet TAKE ONE TABLET BY MOUTH TWICE DAILY BEFORE MEALS 60 tablet 5   No current facility-administered medications on file prior to visit.         Objective:   Physical Exam Blood pressure 134/84, pulse 68, temperature 98.3 F (36.8 C), height 5\' 10"  (1.778 m), weight 251 lb 4.8 oz (114 kg).  Alert and oriented. Skin warm and dry. Oral mucosa is moist.   . Sclera anicteric, conjunctivae is pink. Thyroid not enlarged. No cervical lymphadenopathy. Lungs clear. Heart regular rate and rhythm.  Abdomen is soft. Bowel sounds are positive. No hepatomegaly. No abdominal masses felt. No tenderness.  No edema to lower extremities     Assessment & Plan:  Gastric lesion. Will  discuss with Dr. Laural Golden.

## 2018-04-18 ENCOUNTER — Telehealth: Payer: Self-pay | Admitting: *Deleted

## 2018-04-18 DIAGNOSIS — R7989 Other specified abnormal findings of blood chemistry: Secondary | ICD-10-CM

## 2018-04-18 DIAGNOSIS — R945 Abnormal results of liver function studies: Principal | ICD-10-CM

## 2018-04-18 DIAGNOSIS — M1A49X0 Other secondary chronic gout, multiple sites, without tophus (tophi): Secondary | ICD-10-CM

## 2018-04-18 DIAGNOSIS — Z79899 Other long term (current) drug therapy: Secondary | ICD-10-CM | POA: Diagnosis not present

## 2018-04-18 NOTE — Telephone Encounter (Signed)
Patient called and scheduled an appointment for Friday with Hoyle Sauer for gout, patient asked if he can do his labs today, patient states he is sure he has gout and he would like to have the blood work done before his appointment. Please call patient

## 2018-04-18 NOTE — Telephone Encounter (Signed)
Patient last had Bmet and Uric acid done on 02/15/2017 please advise.

## 2018-04-18 NOTE — Telephone Encounter (Signed)
Gout is more of aclinical dx but go ahead and do liv enz and met 7 and uric acid

## 2018-04-18 NOTE — Telephone Encounter (Signed)
Ordered and pt is aware.  

## 2018-04-19 LAB — BASIC METABOLIC PANEL
BUN / CREAT RATIO: 9 (ref 9–20)
BUN: 13 mg/dL (ref 6–20)
CHLORIDE: 107 mmol/L — AB (ref 96–106)
CO2: 21 mmol/L (ref 20–29)
Calcium: 9.9 mg/dL (ref 8.7–10.2)
Creatinine, Ser: 1.43 mg/dL — ABNORMAL HIGH (ref 0.76–1.27)
GFR calc non Af Amer: 65 mL/min/{1.73_m2} (ref >59)
GFR, EST AFRICAN AMERICAN: 75 mL/min/{1.73_m2} (ref >59)
GLUCOSE: 96 mg/dL (ref 65–99)
Potassium: 3.9 mmol/L (ref 3.5–5.2)
Sodium: 145 mmol/L — ABNORMAL HIGH (ref 134–144)

## 2018-04-19 LAB — HEPATIC FUNCTION PANEL
ALT: 32 IU/L (ref 0–44)
AST: 23 IU/L (ref 0–40)
Albumin: 4.8 g/dL (ref 3.5–5.5)
Alkaline Phosphatase: 71 IU/L (ref 39–117)
BILIRUBIN TOTAL: 0.5 mg/dL (ref 0.0–1.2)
BILIRUBIN, DIRECT: 0.12 mg/dL (ref 0.00–0.40)
Total Protein: 7.5 g/dL (ref 6.0–8.5)

## 2018-04-19 LAB — URIC ACID: URIC ACID: 11 mg/dL — AB (ref 3.7–8.6)

## 2018-04-19 NOTE — Telephone Encounter (Signed)
Noted  

## 2018-04-21 ENCOUNTER — Ambulatory Visit (INDEPENDENT_AMBULATORY_CARE_PROVIDER_SITE_OTHER): Payer: BLUE CROSS/BLUE SHIELD | Admitting: Nurse Practitioner

## 2018-04-21 ENCOUNTER — Encounter: Payer: Self-pay | Admitting: Nurse Practitioner

## 2018-04-21 VITALS — BP 122/74 | Temp 98.4°F | Ht 70.0 in | Wt 246.0 lb

## 2018-04-21 DIAGNOSIS — M9901 Segmental and somatic dysfunction of cervical region: Secondary | ICD-10-CM | POA: Diagnosis not present

## 2018-04-21 DIAGNOSIS — M9902 Segmental and somatic dysfunction of thoracic region: Secondary | ICD-10-CM | POA: Diagnosis not present

## 2018-04-21 DIAGNOSIS — M1009 Idiopathic gout, multiple sites: Secondary | ICD-10-CM | POA: Diagnosis not present

## 2018-04-21 DIAGNOSIS — Z79899 Other long term (current) drug therapy: Secondary | ICD-10-CM | POA: Diagnosis not present

## 2018-04-21 DIAGNOSIS — M542 Cervicalgia: Secondary | ICD-10-CM | POA: Diagnosis not present

## 2018-04-21 DIAGNOSIS — M546 Pain in thoracic spine: Secondary | ICD-10-CM | POA: Diagnosis not present

## 2018-04-21 MED ORDER — ALLOPURINOL 300 MG PO TABS: 300.0000 mg | ORAL_TABLET | Freq: Two times a day (BID) | ORAL | 2 refills | Status: DC

## 2018-04-21 NOTE — Progress Notes (Signed)
Subjective: Presents for complaints of a flareup of his gout for the past several weeks.  Mainly in his left great toe.  Also having some pain in his left index finger and both knees at times.  No erythema warmth or edema.  Has a history of gout, has been off his allopurinol for months and was stable.  No fevers.  His knee pain is occasional especially with squatting or putting pressure on the knees.  Objective:   BP 122/74   Temp 98.4 F (36.9 C) (Oral)   Ht '5\' 10"'  (1.778 m)   Wt 246 lb (111.6 kg)   BMI 35.30 kg/m  NAD.  Alert, oriented.  Lungs clear.  Heart regular rate and rhythm.  No erythema or warmth in the left index finger.  Knees no edema.  Minimal crepitus with range of motion. Results for orders placed or performed in visit on 04/18/18  Hepatic function panel  Result Value Ref Range   Total Protein 7.5 6.0 - 8.5 g/dL   Albumin 4.8 3.5 - 5.5 g/dL   Bilirubin Total 0.5 0.0 - 1.2 mg/dL   Bilirubin, Direct 0.12 0.00 - 0.40 mg/dL   Alkaline Phosphatase 71 39 - 117 IU/L   AST 23 0 - 40 IU/L   ALT 32 0 - 44 IU/L  Basic metabolic panel  Result Value Ref Range   Glucose 96 65 - 99 mg/dL   BUN 13 6 - 20 mg/dL   Creatinine, Ser 1.43 (H) 0.76 - 1.27 mg/dL   GFR calc non Af Amer 65 >59 mL/min/1.73   GFR calc Af Amer 75 >59 mL/min/1.73   BUN/Creatinine Ratio 9 9 - 20   Sodium 145 (H) 134 - 144 mmol/L   Potassium 3.9 3.5 - 5.2 mmol/L   Chloride 107 (H) 96 - 106 mmol/L   CO2 21 20 - 29 mmol/L   Calcium 9.9 8.7 - 10.2 mg/dL  Uric acid  Result Value Ref Range   Uric Acid 11.0 (H) 3.7 - 8.6 mg/dL     Assessment:   Problem List Items Addressed This Visit      Other   Gout - Primary   Relevant Orders   Uric acid   Basic metabolic panel    Other Visit Diagnoses    High risk medication use       Relevant Orders   Uric acid   Basic metabolic panel       Plan:   Meds ordered this encounter  Medications  . allopurinol (ZYLOPRIM) 300 MG tablet    Sig: Take 1 tablet (300  mg total) by mouth 2 (two) times daily.    Dispense:  60 tablet    Refill:  2    Order Specific Question:   Supervising Provider    Answer:   Mikey Kirschner [[id number]]   Repeat uric acid level and met 7 in 1 month.  Call back in a few weeks if any of his joint pain persists, sooner if worse.

## 2018-04-22 ENCOUNTER — Other Ambulatory Visit: Payer: Self-pay | Admitting: Nurse Practitioner

## 2018-04-28 DIAGNOSIS — M9902 Segmental and somatic dysfunction of thoracic region: Secondary | ICD-10-CM | POA: Diagnosis not present

## 2018-04-28 DIAGNOSIS — M9901 Segmental and somatic dysfunction of cervical region: Secondary | ICD-10-CM | POA: Diagnosis not present

## 2018-04-28 DIAGNOSIS — M542 Cervicalgia: Secondary | ICD-10-CM | POA: Diagnosis not present

## 2018-04-28 DIAGNOSIS — M546 Pain in thoracic spine: Secondary | ICD-10-CM | POA: Diagnosis not present

## 2018-06-02 DIAGNOSIS — Z79899 Other long term (current) drug therapy: Secondary | ICD-10-CM | POA: Diagnosis not present

## 2018-06-02 DIAGNOSIS — M1009 Idiopathic gout, multiple sites: Secondary | ICD-10-CM | POA: Diagnosis not present

## 2018-06-03 ENCOUNTER — Encounter: Payer: Self-pay | Admitting: Nurse Practitioner

## 2018-06-03 LAB — BASIC METABOLIC PANEL
BUN / CREAT RATIO: 11 (ref 9–20)
BUN: 12 mg/dL (ref 6–20)
CALCIUM: 9.5 mg/dL (ref 8.7–10.2)
CHLORIDE: 102 mmol/L (ref 96–106)
CO2: 25 mmol/L (ref 20–29)
Creatinine, Ser: 1.14 mg/dL (ref 0.76–1.27)
GFR calc non Af Amer: 85 mL/min/{1.73_m2} (ref >59)
GFR, EST AFRICAN AMERICAN: 99 mL/min/{1.73_m2} (ref >59)
GLUCOSE: 119 mg/dL — AB (ref 65–99)
POTASSIUM: 4 mmol/L (ref 3.5–5.2)
Sodium: 140 mmol/L (ref 134–144)

## 2018-06-03 LAB — URIC ACID: Uric Acid: 5.6 mg/dL (ref 3.7–8.6)

## 2018-07-25 ENCOUNTER — Ambulatory Visit (INDEPENDENT_AMBULATORY_CARE_PROVIDER_SITE_OTHER): Payer: BLUE CROSS/BLUE SHIELD | Admitting: Family Medicine

## 2018-07-25 ENCOUNTER — Encounter: Payer: Self-pay | Admitting: Family Medicine

## 2018-07-25 VITALS — BP 122/82 | Ht 70.0 in | Wt 251.4 lb

## 2018-07-25 DIAGNOSIS — M109 Gout, unspecified: Secondary | ICD-10-CM | POA: Diagnosis not present

## 2018-07-25 DIAGNOSIS — R21 Rash and other nonspecific skin eruption: Secondary | ICD-10-CM | POA: Diagnosis not present

## 2018-07-25 MED ORDER — TRIAMCINOLONE ACETONIDE 0.1 % EX CREA: 1.0000 "application " | TOPICAL_CREAM | Freq: Two times a day (BID) | CUTANEOUS | 2 refills | Status: DC

## 2018-07-25 NOTE — Progress Notes (Signed)
   Subjective:    Patient ID: [name], male    DOB: 01-02-1986, 32 y.o.   MRN: [id number]  HPI  Patient arrives with ras on feet- worse on lef   Feet breaking out and itchy  Has ad shingles before and kind of looks like it     Sweats a ot in the summer    Itchy all the time  No known exposures at all      no otc meds yet      Review of Systems No headache, no major weight loss or weight gain, no chest pain no back pain abdominal pain no change in bowel habits complete ROS otherwise negative     Objective:   Physical Exam Alert vitals stable, NAD. Blood pressure good on repeat. HEENT normal. Lungs clear. Heart regular rate and rhythm. Patchy irregular nodular rash dorsum left foot       Assessment & Plan:  Impression status post jellyfish sting 2 weeks ago with now residual irritant/allergenic rash.  Discussed.  Triamcinolone cream twice daily to affected area expect gradual resolution  2.  Gout.  Discussed.  Patient has had numerous recurrences.  Importance of maintaining allopurinol encouraged

## 2018-08-28 ENCOUNTER — Other Ambulatory Visit: Payer: Self-pay | Admitting: *Deleted

## 2018-08-28 MED ORDER — ALLOPURINOL 300 MG PO TABS: 300.0000 mg | ORAL_TABLET | Freq: Two times a day (BID) | ORAL | 5 refills | Status: DC

## 2018-08-29 ENCOUNTER — Encounter (INDEPENDENT_AMBULATORY_CARE_PROVIDER_SITE_OTHER): Payer: Self-pay | Admitting: *Deleted

## 2018-08-29 ENCOUNTER — Encounter (INDEPENDENT_AMBULATORY_CARE_PROVIDER_SITE_OTHER): Payer: Self-pay | Admitting: Internal Medicine

## 2018-08-29 ENCOUNTER — Ambulatory Visit (INDEPENDENT_AMBULATORY_CARE_PROVIDER_SITE_OTHER): Payer: BLUE CROSS/BLUE SHIELD | Admitting: Internal Medicine

## 2018-08-29 VITALS — BP 130/80 | HR 64 | Temp 98.1°F | Ht 70.0 in | Wt 252.2 lb

## 2018-08-29 DIAGNOSIS — K219 Gastro-esophageal reflux disease without esophagitis: Secondary | ICD-10-CM | POA: Diagnosis not present

## 2018-08-29 DIAGNOSIS — R1319 Other dysphagia: Secondary | ICD-10-CM

## 2018-08-29 DIAGNOSIS — R1310 Dysphagia, unspecified: Secondary | ICD-10-CM

## 2018-08-29 NOTE — Patient Instructions (Signed)
The risks of bleeding, perforation and infection were reviewed with patient.  

## 2018-08-29 NOTE — Progress Notes (Signed)
Subjective:    Patient ID: [name], male    DOB: 1986-02-01, 32 y.o.   MRN: [id number]  HPI Presents today with c/o chest pain. Sometimes when he eats, foods are slow to go down. Symptoms for the past 2 weeks.  Sometimes he has chest pain when he eats. His last EGD was in 2017 for dysphagia.  He says his acid reflux has been worse. Has been taking the pPotonix BID since this pain started. His appetite is okay. He tries to avoid spicy foods. Has a BM 3-5 times a day. Has seen some blood in the toilet and the commode.     12/27/2016 Colonoscopy Dr. Anthony Sar, rectal bleeding and abdominal pain:  Normal.   07/12/2016 EGD/ED Dysphagia. Diarrhea x 3 weeks.   Impression: - Normal esophagus. DYSPHAGIA MOST LIKLEY DUE TO  UNCONTROLELD REFLUX - EPIGASTRIC PAIN DUE TO Gastritis/GERD. - Nodular mucosa in the ampulla UNCHANGED SINCE  PRIOR EXAM IN [facility] 2016.    04/29/2015 EGD: EGD with biopsy Enlarge Ampulla on EGD/ED.  ENDOSCOPIC IMPRESSION: 1. MILD Non-erosive gastritis (inflammation) was found in the gastric antrum 3. PROMINENT AMPULLA  6 MM x 6 MM   03/20/2014 EGD with biopsies, upper EUS: Ampullary lesion. Dr. Paulita Fujita The ampulla appears prominent, about 10 x 12 mm in size, no obvious mass like appearance, no invasion into pancreas or neighboring structures. Some benign appearing periportal lymph nodes.  Biopsy: Chronic duodenitis, consistent with peptic duodenitis. No villous atrophy, H. Pyylori, or malignancy identified.    02/25/2014 EGD with biopsy:  No source for melena identified. Multiple gastric sessile polyp in the gastric fundus. Most likely fundic gland polyps. Mild non erosive gastritis. Probable duodenal adenomas involving the major and minor papilla.  Biopsy: . Stomach, biopsy - UNREMARKABLE GASTRIC MUCOSA. NO HELICOBACTER PYLORI,  DYSPLASIA, OR MALIGNANCY.   Stomach, polyp(s) - BENIGN FUNDIC GLAND POLYPS. NO HELICOBACTER PYLORI ADENOMATOUS CHANGE OR MALIGNANCY.     02/25/2014 Colonoscopy:  ENDOSCOPIC IMPRESSION: 1. Normal mucosa in the terminal ileum 2. ONE SMALL polyp AND ONE LARGE POLYP REMOVED FROM the sigmoid colon 3. RECTAL BLEEDING due to large sigmoid colon polyp and Large internal hemorrhoids  Biopsy: Tubular adenoma.   Review of Systems Past Medical History:  Diagnosis Date  . Arthritis   . Elevated LFTs    mildly elevated transaminases in past, now normalized   . GERD (gastroesophageal reflux disease)   . Gout   . History of kidney stones     Past Surgical History:  Procedure Laterality Date  . APPENDECTOMY  01/27/2015  . CHOLECYSTECTOMY    . COLONOSCOPY WITH ESOPHAGOGASTRODUODENOSCOPY (EGD) N/A 02/25/2014   NL TI, 2 SIMPLE ADENOMAS(1:>1 CM), LGE IH-FG POLYPS, PROMINENT AMPULLA. Needs Colonoscopy surveillance in 2018  . ESOPHAGOGASTRODUODENOSCOPY N/A 04/29/2015   Dr. Oneida Alar: 1. mild non-erosive gastritis (inflammation) was found in the gastric antrum. 2. Prominent Ampullla 49mmx 97mm. benign path with pyloric metaplasia  . ESOPHAGOGASTRODUODENOSCOPY N/A 07/12/2016   Procedure: ESOPHAGOGASTRODUODENOSCOPY (EGD);  Surgeon: Danie Binder, MD;  Location: [facility];  Service: Endoscopy;  Laterality: N/A;  830  . EUS N/A 03/20/2014   Dr. Paulita Fujita: prominent major papilla s/p biopsy, minor papilla without clear adenomatous or mass-like appearance, chronic duodenitis on path.   Marland Kitchen KIDNEY STONE SURGERY  age 70 and age 56   lithotripsy, stent  . SAVORY DILATION N/A 07/12/2016   Procedure: SAVORY DILATION;  Surgeon: Danie Binder, MD;  Location: [facility];  Service: Endoscopy;  Laterality: N/A;  .  WISDOM TOOTH EXTRACTION      Allergies  Allergen Reactions  . Phenergan [Promethazine Hcl] Other (See Comments)    arn swelled and hurt for 3 days, pt would rather not have    Current Outpatient  Medications on File Prior to Visit  Medication Sig Dispense Refill  . allopurinol (ZYLOPRIM) 300 MG tablet Take 1 tablet (300 mg total) by mouth 2 (two) times daily. (Patient taking differently: Take 300 mg by mouth daily. ) 60 tablet 5  . pantoprazole (PROTONIX) 40 MG tablet TAKE ONE TABLET BY MOUTH TWICE DAILY BEFORE MEALS 60 tablet 5   No current facility-administered medications on file prior to visit.         Objective:   Physical Exam Blood pressure 130/80, pulse 64, temperature 98.1 F (36.7 C), height 5\' 10"  (1.778 m), weight 252 lb 3.2 oz (114.4 kg). Alert and oriented. Skin warm and dry. Oral mucosa is moist.   . Sclera anicteric, conjunctivae is pink. Thyroid not enlarged. No cervical lymphadenopathy. Lungs clear. Heart regular rate and rhythm.  Abdomen is soft. Bowel sounds are positive. No hepatomegaly. No abdominal masses felt. No tenderness.  No edema to lower extremities.           Assessment & Plan:  Dysphagia: EGD/ED. The risks of bleeding, perforation and infection were reviewed with patient. Rectal bleeding: Am going to send 3 stools cards home with him. Colonoscopy in 2018 normal.

## 2018-08-30 ENCOUNTER — Encounter (HOSPITAL_COMMUNITY)
Admission: RE | Disposition: A | Discharge status: Home / Self Care | Payer: Self-pay | Source: Ambulatory Visit | Attending: Internal Medicine

## 2018-08-30 ENCOUNTER — Encounter (HOSPITAL_COMMUNITY): Payer: Self-pay | Admitting: *Deleted

## 2018-08-30 ENCOUNTER — Other Ambulatory Visit: Payer: Self-pay

## 2018-08-30 ENCOUNTER — Ambulatory Visit (HOSPITAL_COMMUNITY)
Admission: RE | Admit: 2018-08-30 | Discharge: 2018-08-30 | Disposition: A | Discharge status: Home / Self Care | Payer: BLUE CROSS/BLUE SHIELD | Source: Ambulatory Visit | Attending: Internal Medicine | Admitting: Internal Medicine

## 2018-08-30 DIAGNOSIS — R1310 Dysphagia, unspecified: Secondary | ICD-10-CM | POA: Insufficient documentation

## 2018-08-30 DIAGNOSIS — R1319 Other dysphagia: Secondary | ICD-10-CM | POA: Insufficient documentation

## 2018-08-30 DIAGNOSIS — Z79899 Other long term (current) drug therapy: Secondary | ICD-10-CM | POA: Insufficient documentation

## 2018-08-30 DIAGNOSIS — M109 Gout, unspecified: Secondary | ICD-10-CM | POA: Diagnosis not present

## 2018-08-30 DIAGNOSIS — K219 Gastro-esophageal reflux disease without esophagitis: Secondary | ICD-10-CM | POA: Insufficient documentation

## 2018-08-30 DIAGNOSIS — K210 Gastro-esophageal reflux disease with esophagitis: Secondary | ICD-10-CM | POA: Insufficient documentation

## 2018-08-30 DIAGNOSIS — K449 Diaphragmatic hernia without obstruction or gangrene: Secondary | ICD-10-CM | POA: Insufficient documentation

## 2018-08-30 DIAGNOSIS — Z888 Allergy status to other drugs, medicaments and biological substances status: Secondary | ICD-10-CM | POA: Diagnosis not present

## 2018-08-30 DIAGNOSIS — K3189 Other diseases of stomach and duodenum: Secondary | ICD-10-CM | POA: Diagnosis not present

## 2018-08-30 HISTORY — PX: ESOPHAGOGASTRODUODENOSCOPY: SHX5428

## 2018-08-30 HISTORY — PX: ESOPHAGEAL DILATION: SHX303

## 2018-08-30 SURGERY — EGD (ESOPHAGOGASTRODUODENOSCOPY)
Anesthesia: Moderate Sedation

## 2018-08-30 MED ORDER — LIDOCAINE VISCOUS HCL 2 % MT SOLN
OROMUCOSAL | Status: DC | PRN
  Administered 2018-08-30: 8 mL via OROMUCOSAL

## 2018-08-30 MED ORDER — MEPERIDINE HCL 50 MG/ML IJ SOLN
INTRAMUSCULAR | Status: DC | PRN
  Administered 2018-08-30 (×4): 25 mg via INTRAVENOUS

## 2018-08-30 MED ORDER — MIDAZOLAM HCL 5 MG/5ML IJ SOLN
INTRAMUSCULAR | Status: AC
  Filled 2018-08-30: qty 10

## 2018-08-30 MED ORDER — MIDAZOLAM HCL 5 MG/5ML IJ SOLN
INTRAMUSCULAR | Status: AC
  Filled 2018-08-30: qty 5

## 2018-08-30 MED ORDER — LIDOCAINE VISCOUS HCL 2 % MT SOLN
OROMUCOSAL | Status: AC
  Filled 2018-08-30: qty 15

## 2018-08-30 MED ORDER — SODIUM CHLORIDE 0.9 % IV SOLN
INTRAVENOUS | Status: DC
  Administered 2018-08-30: 12:00:00 via INTRAVENOUS

## 2018-08-30 MED ORDER — STERILE WATER FOR IRRIGATION IR SOLN
Status: DC | PRN
  Administered 2018-08-30: 100 mL

## 2018-08-30 MED ORDER — MIDAZOLAM HCL 5 MG/5ML IJ SOLN
INTRAMUSCULAR | Status: DC | PRN
  Administered 2018-08-30 (×3): 2 mg via INTRAVENOUS
  Administered 2018-08-30 (×2): 3 mg via INTRAVENOUS
  Administered 2018-08-30: 2 mg via INTRAVENOUS

## 2018-08-30 MED ORDER — MEPERIDINE HCL 50 MG/ML IJ SOLN
INTRAMUSCULAR | Status: AC
  Filled 2018-08-30: qty 1

## 2018-08-30 NOTE — Op Note (Signed)
[facility] Patient Name: [name] Procedure Date: 08/30/2018 12:33 PM MRN: [id number] Date of Birth: Feb 09, 1986 Attending MD: Hildred Laser , MD CSN: [id number] Age: 32 Admit Type: Outpatient Procedure:                Upper GI endoscopy Indications:              Odynophagia, Follow-up of gastro-esophageal reflux                            disease Providers:                Hildred Laser, MD, Rosina Lowenstein, RN, Randa Spike, Technician Referring MD:             Grace Bushy. Wolfgang Phoenix, MD Medicines:                Lidocaine spray, Meperidine 100 mg IV, Midazolam 14                            mg IV Complications:            No immediate complications. Estimated Blood Loss:     Estimated blood loss: none. Procedure:                Pre-Anesthesia Assessment:                           - Prior to the procedure, a History and Physical                            was performed, and patient medications and                            allergies were reviewed. The patient's tolerance of                            previous anesthesia was also reviewed. The risks                            and benefits of the procedure and the sedation                            options and risks were discussed with the patient.                            All questions were answered, and informed consent                            was obtained. Prior Anticoagulants: The patient has                            taken no previous anticoagulant or antiplatelet                            agents. ASA  Grade Assessment: II - A patient with                            mild systemic disease. After reviewing the risks                            and benefits, the patient was deemed in                            satisfactory condition to undergo the procedure.                           After obtaining informed consent, the endoscope was                            passed under direct vision.  Throughout the                            procedure, the patient's blood pressure, pulse, and                            oxygen saturations were monitored continuously. The                            [id number]-H190 ([id number]) scope was introduced through the                            mouth, and advanced to the second part of duodenum.                            The upper GI endoscopy was accomplished without                            difficulty. The patient tolerated the procedure                            well. Scope In: 1:12:10 PM Scope Out: 1:23:59 PM Total Procedure Duration: 0 hours 11 minutes 49 seconds  Findings:      The examined esophagus was normal.      Mils esophagitis at GEJ esophagitis was found 38 cm from the incisors.      A 2 cm hiatal hernia was present.      No endoscopic abnormality was evident in the esophagus to explain the       patient's complaint of dysphagia. It was decided, however, to proceed       with dilation of the entire esophagus. The scope was withdrawn. Dilation       was performed with a Maloney dilator with no resistance at 21 Fr. The       dilation site was examined following endoscope reinsertion and showed no       change and no bleeding, mucosal tear or perforation.      Three erosions were found in the prepyloric region of the stomach.      Small polyps at [employer].      The exam of the stomach was  otherwise normal.      The duodenal bulb was normal.      Prominent minor and major papilla.      Major papilla unchanged since initial exam of 2015. Impression:               - Normal esophagus.                           - Mils esophagitis at GEJ reflux esophagitis.                           - 2 cm hiatal hernia.                           - No endoscopic esophageal abnormality to explain                            patient's dysphagia. Esophagus dilated. Dilated.                           - Erosive gastropathy( note H.pylori and prior                             gastric biopsy negative for H.Pylori.                           - Small polyps at gastric body. Left alone.                           - Normal duodenal bulb.                           - Prominent major and minor papillae.                           - No specimens collected. Moderate Sedation:      Moderate (conscious) sedation was administered by the endoscopy nurse       and supervised by the endoscopist. The following parameters were       monitored: oxygen saturation, heart rate, blood pressure, CO2       capnography and response to care. Total physician intraservice time was       24 minutes. Recommendation:           - Patient has a contact number available for                            emergencies. The signs and symptoms of potential                            delayed complications were discussed with the                            patient. Return to normal activities tomorrow.                            Written discharge instructions were provided to the  patient.                           - Resume previous diet today.                           - Continue present medications.                           - Anti-reflux measures.                           - Return to GI clinic in 8 weeks. Procedure Code(s):        --- Professional ---                           [phone], Esophagogastroduodenoscopy, flexible,                            transoral; diagnostic, including collection of                            specimen(s) by brushing or washing, when performed                            (separate procedure)                           43450, Dilation of esophagus, by unguided sound or                            bougie, single or multiple passes                           G0500, Moderate sedation services provided by the                            same physician or other qualified health care                            professional performing a  gastrointestinal                            endoscopic service that sedation supports,                            requiring the presence of an independent trained                            observer to assist in the monitoring of the                            patient's level of consciousness and physiological                            status; initial 15 minutes of intra-service time;  patient age 66 years or older (additional time may                            be reported with [phone], as appropriate)                           [phone], Moderate sedation services provided by the                            same physician or other qualified health care                            professional performing the diagnostic or                            therapeutic service that the sedation supports,                            requiring the presence of an independent trained                            observer to assist in the monitoring of the                            patient's level of consciousness and physiological                            status; each additional 15 minutes intraservice                            time (List separately in addition to code for                            primary service) Diagnosis Code(s):        --- Professional ---                           K21.0, Gastro-esophageal reflux disease with                            esophagitis                           K44.9, Diaphragmatic hernia without obstruction or                            gangrene                           R13.10, Dysphagia, unspecified                           K31.89, Other diseases of stomach and duodenum CPT copyright 2017 American Medical Association. All rights reserved. The codes documented in this report are preliminary and upon coder review may  be revised to meet current compliance requirements. Hildred Laser, MD Bernadene Person  Laural Golden, MD 08/30/2018 1:47:03 PM This report has  been signed electronically. Number of Addenda: 0

## 2018-08-30 NOTE — Discharge Instructions (Signed)
Take pantoprazole 40 mg by mouth 30 minutes before breakfast and evening meal daily until office visit in 8 weeks. Resume other medications as before. Resume usual diet. Anti-reflux measures (sheet). No driving for 24 hours. Office visit in 8 weeks.  Esophagogastroduodenoscopy, Care After Refer to this sheet in the next few weeks. These instructions provide you with information about caring for yourself after your procedure. Your health care provider may also give you more specific instructions. Your treatment has been planned according to current medical practices, but problems sometimes occur. Call your health care provider if you have any problems or questions after your procedure.  Dr Laural Golden: [phone].  After hours and weekends call the hospital and have the GI doctor on call paged; they will call you back. What can I expect after the procedure? After the procedure, it is common to have:  A sore throat.  Nausea.  Bloating.  Dizziness.  Fatigue.  Follow these instructions at home:  Do not eat or drink anything until the numbing medicine (local anesthetic) has worn off and your gag reflex has returned. You will know that the local anesthetic has worn off when you can swallow comfortably.  Do not drive for 24 hours if you received a medicine to help you relax (sedative).  If your health care provider took a tissue sample for testing during the procedure, make sure to get your test results. This is your responsibility. Ask your health care provider or the department performing the test when your results will be ready.  Keep all follow-up visits as told by your health care provider. This is important. Contact a health care provider if:  You cannot stop coughing.  You are not urinating.  You are urinating less than usual. Get help right away if:  You have trouble swallowing.  You cannot eat or drink.  You have throat or chest pain that gets worse.  You have nausea or  vomiting.  You have chills.  You have a fever.  You have severe abdominal pain.  You have black, tarry, or bloody stools. This information is not intended to replace advice given to you by your health care provider. Make sure you discuss any questions you have with your health care provider. Document Released: 11/15/2012 Document Revised: 05/06/2016 Document Reviewed: 10/23/2015 Elsevier Interactive Patient Education  2018 [employer].   Gastroesophageal Reflux Disease, Adult Normally, food travels down the esophagus and stays in the stomach to be digested. However, when a person has gastroesophageal reflux disease (GERD), food and stomach acid move back up into the esophagus. When this happens, the esophagus becomes sore and inflamed. Over time, GERD can create small holes (ulcers) in the lining of the esophagus. What are the causes? This condition is caused by a problem with the muscle between the esophagus and the stomach (lower esophageal sphincter, or LES). Normally, the LES muscle closes after food passes through the esophagus to the stomach. When the LES is weakened or abnormal, it does not close properly, and that allows food and stomach acid to go back up into the esophagus. The LES can be weakened by certain dietary substances, medicines, and medical conditions, including:  Tobacco use.  Pregnancy.  Having a hiatal hernia.  Heavy alcohol use.  Certain foods and beverages, such as coffee, chocolate, onions, and peppermint.  What increases the risk? This condition is more likely to develop in:  People who have an increased body weight.  People who have connective tissue disorders.  People who  use NSAID medicines.  What are the signs or symptoms? Symptoms of this condition include:  Heartburn.  Difficult or painful swallowing.  The feeling of having a lump in the throat.  Abitter taste in the mouth.  Bad breath.  Having a large amount of saliva.  Having  an upset or bloated stomach.  Belching.  Chest pain.  Shortness of breath or wheezing.  Ongoing (chronic) cough or a night-time cough.  Wearing away of tooth enamel.  Weight loss.  Different conditions can cause chest pain. Make sure to see your health care provider if you experience chest pain. How is this diagnosed? Your health care provider will take a medical history and perform a physical exam. To determine if you have mild or severe GERD, your health care provider may also monitor how you respond to treatment. You may also have other tests, including:  An endoscopy toexamine your stomach and esophagus with a small camera.  A test thatmeasures the acidity level in your esophagus.  A test thatmeasures how much pressure is on your esophagus.  A barium swallow or modified barium swallow to show the shape, size, and functioning of your esophagus.  How is this treated? The goal of treatment is to help relieve your symptoms and to prevent complications. Treatment for this condition may vary depending on how severe your symptoms are. Your health care provider may recommend:  Changes to your diet.  Medicine.  Surgery.  Follow these instructions at home: Diet  Follow a diet as recommended by your health care provider. This may involve avoiding foods and drinks such as: ? Coffee and tea (with or without caffeine). ? Drinks that containalcohol. ? Energy drinks and sports drinks. ? Carbonated drinks or sodas. ? Chocolate and cocoa. ? Peppermint and mint flavorings. ? Garlic and onions. ? Horseradish. ? Spicy and acidic foods, including peppers, chili powder, curry powder, vinegar, hot sauces, and barbecue sauce. ? Citrus fruit juices and citrus fruits, such as oranges, lemons, and limes. ? Tomato-based foods, such as red sauce, chili, salsa, and pizza with red sauce. ? Fried and fatty foods, such as donuts, french fries, potato chips, and high-fat dressings. ? High-fat  meats, such as hot dogs and fatty cuts of red and white meats, such as rib eye steak, sausage, ham, and bacon. ? High-fat dairy items, such as whole milk, butter, and cream cheese.  Eat small, frequent meals instead of large meals.  Avoid drinking large amounts of liquid with your meals.  Avoid eating meals during the 2-3 hours before bedtime.  Avoid lying down right after you eat.  Do not exercise right after you eat. General instructions  Pay attention to any changes in your symptoms.  Take over-the-counter and prescription medicines only as told by your health care provider. Do not take aspirin, ibuprofen, or other NSAIDs unless your health care provider told you to do so.  Do not use any tobacco products, including cigarettes, chewing tobacco, and e-cigarettes. If you need help quitting, ask your health care provider.  Wear loose-fitting clothing. Do not wear anything tight around your waist that causes pressure on your abdomen.  Raise (elevate) the head of your bed 6 inches (15cm).  Try to reduce your stress, such as with yoga or meditation. If you need help reducing stress, ask your health care provider.  If you are overweight, reduce your weight to an amount that is healthy for you. Ask your health care provider for guidance about a safe weight loss  goal.  Keep all follow-up visits as told by your health care provider. This is important. Contact a health care provider if:  You have new symptoms.  You have unexplained weight loss.  You have difficulty swallowing, or it hurts to swallow.  You have wheezing or a persistent cough.  Your symptoms do not improve with treatment.  You have a hoarse voice. Get help right away if:  You have pain in your arms, neck, jaw, teeth, or back.  You feel sweaty, dizzy, or light-headed.  You have chest pain or shortness of breath.  You vomit and your vomit looks like blood or coffee grounds.  You faint.  Your stool is bloody  or black.  You cannot swallow, drink, or eat. This information is not intended to replace advice given to you by your health care provider. Make sure you discuss any questions you have with your health care provider. Document Released: 09/08/2005 Document Revised: 04/28/2016 Document Reviewed: 03/26/2015 Elsevier Interactive Patient Education  [employer].

## 2018-08-30 NOTE — H&P (Signed)
[name] is an 32 y.o. male.   Chief Complaint: Patient is here for EGD and possible ED. HPI: Patient is 32 year old Caucasian male who has had heartburn for at least 20 years who is been on single double dose PPI who presents with 2-week history of chest pain every time he swallows.  He also has woken up in the middle of night with chest pain.  He has daily heartburn in spite of taking medications.  He denies nausea vomiting hematemesis melena or abdominal pain.  He does not smoke cigarettes or drink alcohol.  Past Medical History:  Diagnosis Date  .    Marland Kitchen Elevated LFTs    mildly elevated transaminases in past, now normalized   . GERD (gastroesophageal reflux disease)   . Gout   . History of kidney stones     Past Surgical History:  Procedure Laterality Date  . APPENDECTOMY  01/27/2015  . CHOLECYSTECTOMY    . COLONOSCOPY WITH ESOPHAGOGASTRODUODENOSCOPY (EGD) N/A 02/25/2014   NL TI, 2 SIMPLE ADENOMAS(1:>1 CM), LGE IH-FG POLYPS, PROMINENT AMPULLA. Needs Colonoscopy surveillance in 2018  . ESOPHAGOGASTRODUODENOSCOPY N/A 04/29/2015   Dr. Oneida Alar: 1. mild non-erosive gastritis (inflammation) was found in the gastric antrum. 2. Prominent Ampullla 59mmx 81mm. benign path with pyloric metaplasia  . ESOPHAGOGASTRODUODENOSCOPY N/A 07/12/2016   Procedure: ESOPHAGOGASTRODUODENOSCOPY (EGD);  Surgeon: Danie Binder, MD;  Location: [facility];  Service: Endoscopy;  Laterality: N/A;  830  . EUS N/A 03/20/2014   Dr. Paulita Fujita: prominent major papilla s/p biopsy, minor papilla without clear adenomatous or mass-like appearance, chronic duodenitis on path.   Marland Kitchen KIDNEY STONE SURGERY  age 75 and age 32   lithotripsy, stent  . SAVORY DILATION N/A 07/12/2016   Procedure: SAVORY DILATION;  Surgeon: Danie Binder, MD;  Location: [facility];  Service: Endoscopy;  Laterality: N/A;  . WISDOM TOOTH EXTRACTION      Family History  Problem Relation Age of Onset  . Diabetes Father   . COPD Father   . Colon  cancer Neg Hx        does not know paternal side   Social History:  reports that he has never smoked. He has never used smokeless tobacco. He reports that he does not drink alcohol or use drugs.  Allergies:  Allergies  Allergen Reactions  . Phenergan [Promethazine Hcl] Other (See Comments)    arn swelled and hurt for 3 days, pt would rather not have    Medications Prior to Admission  Medication Sig Dispense Refill  . allopurinol (ZYLOPRIM) 300 MG tablet Take 1 tablet (300 mg total) by mouth 2 (two) times daily. (Patient taking differently: Take 300 mg by mouth daily. ) 60 tablet 5  . pantoprazole (PROTONIX) 40 MG tablet TAKE ONE TABLET BY MOUTH TWICE DAILY BEFORE MEALS 60 tablet 5    No results found for this or any previous visit (from the past 82 hour(s)). No results found.  ROS  Blood pressure 124/83, pulse (!) 53, temperature 98.8 F (37.1 C), temperature source Oral, resp. rate 19, SpO2 100 %. Physical Exam  Constitutional: He appears well-developed and well-nourished.  HENT:  Mouth/Throat: Oropharynx is clear and moist.  Eyes: Conjunctivae are normal. No scleral icterus.  Neck: No thyromegaly present.  Cardiovascular: Normal rate, regular rhythm and normal heart sounds.  No murmur heard. Respiratory: Effort normal and breath sounds normal.  GI:  Abdomen is full.  It is soft and nontender with organomegaly or masses.  Musculoskeletal: He exhibits  no edema.  Lymphadenopathy:    He has no cervical adenopathy.  Neurological: He is alert.  Skin: Skin is warm and dry.     Assessment/Plan Odynophagia in a patient with chronic GERD. EGD and possible ED.  Hildred Laser, MD 08/30/2018, 12:55 PM

## 2018-09-04 ENCOUNTER — Encounter (HOSPITAL_COMMUNITY): Payer: Self-pay | Admitting: Internal Medicine

## 2018-09-04 ENCOUNTER — Telehealth (INDEPENDENT_AMBULATORY_CARE_PROVIDER_SITE_OTHER): Payer: Self-pay | Admitting: Internal Medicine

## 2018-09-04 NOTE — Telephone Encounter (Signed)
Patient had a procedure done on 08-30-18 and he is having diarrhea and cramping - wants to know what he can do - please call him at [phone]

## 2018-09-21 NOTE — Telephone Encounter (Signed)
Patient was called and a message was left asking that he call us back.

## 2018-10-25 ENCOUNTER — Ambulatory Visit (INDEPENDENT_AMBULATORY_CARE_PROVIDER_SITE_OTHER): Payer: BLUE CROSS/BLUE SHIELD | Admitting: Internal Medicine

## 2018-10-31 ENCOUNTER — Encounter (INDEPENDENT_AMBULATORY_CARE_PROVIDER_SITE_OTHER): Payer: Self-pay | Admitting: Internal Medicine

## 2018-10-31 ENCOUNTER — Ambulatory Visit (INDEPENDENT_AMBULATORY_CARE_PROVIDER_SITE_OTHER): Payer: BLUE CROSS/BLUE SHIELD | Admitting: Internal Medicine

## 2018-10-31 VITALS — BP 150/80 | HR 64 | Temp 98.3°F | Ht 70.0 in | Wt 252.1 lb

## 2018-10-31 DIAGNOSIS — K2100 Gastro-esophageal reflux disease with esophagitis, without bleeding: Secondary | ICD-10-CM

## 2018-10-31 NOTE — Progress Notes (Signed)
Subjective:    Patient ID: [name], male    DOB: 11-27-1986, 32 y.o.   MRN: [id number]  HPI Here today for f/u. Under went an EGD in September of this year for odynophagia, GERD.  Impression:               - Normal esophagus.                           - Mils esophagitis at GEJ reflux esophagitis.                           - 2 cm hiatal hernia.                           - No endoscopic esophageal abnormality to explain                            patient's dysphagia. Esophagus dilated. Dilated.                           - Erosive gastropathy( note H.pylori and prior                            gastric biopsy negative for H.Pylori.                           - Small polyps at gastric body. Left alone.                           - Normal duodenal bulb.                           - Prominent major and minor papillae. He tells me he is still have pain in his esophagus. He has trouble swallowing. Appetite is okay. He has the esophageal pain is random. Not related to movement. Occurs during the day.  Appetite is good. No weight loss.  BMs ar normal  Review of Systems Past Medical History:  Diagnosis Date  . Arthritis   . Elevated LFTs    mildly elevated transaminases in past, now normalized   . GERD (gastroesophageal reflux disease)   . Gout   . History of kidney stones     Past Surgical History:  Procedure Laterality Date  . APPENDECTOMY  01/27/2015  . CHOLECYSTECTOMY    . COLONOSCOPY WITH ESOPHAGOGASTRODUODENOSCOPY (EGD) N/A 02/25/2014   NL TI, 2 SIMPLE ADENOMAS(1:>1 CM), LGE IH-FG POLYPS, PROMINENT AMPULLA. Needs Colonoscopy surveillance in 2018  . ESOPHAGEAL DILATION N/A 08/30/2018   Procedure: ESOPHAGEAL DILATION;  Surgeon: Rogene Houston, MD;  Location: [facility];  Service: Endoscopy;  Laterality: N/A;  . ESOPHAGOGASTRODUODENOSCOPY N/A 04/29/2015   Dr. Oneida Alar: 1. mild non-erosive gastritis (inflammation) was found in the gastric antrum. 2. Prominent Ampullla 51mmx 30mm.  benign path with pyloric metaplasia  . ESOPHAGOGASTRODUODENOSCOPY N/A 07/12/2016   Procedure: ESOPHAGOGASTRODUODENOSCOPY (EGD);  Surgeon: Danie Binder, MD;  Location: [facility];  Service: Endoscopy;  Laterality: N/A;  830  . ESOPHAGOGASTRODUODENOSCOPY N/A 08/30/2018   Procedure: ESOPHAGOGASTRODUODENOSCOPY (EGD);  Surgeon: Rogene Houston, MD;  Location: [facility];  Service: Endoscopy;  Laterality:  N/A;  2:00  . EUS N/A 03/20/2014   Dr. Paulita Fujita: prominent major papilla s/p biopsy, minor papilla without clear adenomatous or mass-like appearance, chronic duodenitis on path.   Marland Kitchen KIDNEY STONE SURGERY  age 67 and age 81   lithotripsy, stent  . SAVORY DILATION N/A 07/12/2016   Procedure: SAVORY DILATION;  Surgeon: Danie Binder, MD;  Location: [facility];  Service: Endoscopy;  Laterality: N/A;  . WISDOM TOOTH EXTRACTION      Allergies  Allergen Reactions  . Phenergan [Promethazine Hcl] Other (See Comments)    arn swelled and hurt for 3 days, pt would rather not have    Current Outpatient Medications on File Prior to Visit  Medication Sig Dispense Refill  . allopurinol (ZYLOPRIM) 300 MG tablet Take 1 tablet (300 mg total) by mouth 2 (two) times daily. (Patient taking differently: Take 300 mg by mouth daily. ) 60 tablet 5  . pantoprazole (PROTONIX) 40 MG tablet TAKE ONE TABLET BY MOUTH TWICE DAILY BEFORE MEALS 60 tablet 5   No current facility-administered medications on file prior to visit.         Objective:   Physical Exam Blood pressure (!) 150/80, pulse 64, temperature 98.3 F (36.8 C), height 5\' 10"  (1.778 m), weight 252 lb 1.6 oz (114.4 kg). Alert and oriented. Skin warm and dry. Oral mucosa is moist.   . Sclera anicteric, conjunctivae is pink. Thyroid not enlarged. No cervical lymphadenopathy. Lungs clear. Heart regular rate and rhythm.  Abdomen is soft. Bowel sounds are positive. No hepatomegaly. No abdominal masses felt. No tenderness.  No edema to lower extremities.            Assessment & Plan:  Dysphagia: He is doing good. GERD: Continue the Protonix.  Prilosec at hs.

## 2018-10-31 NOTE — Patient Instructions (Addendum)
Continue the Protonix. OV in 1 year.  Make take the Prilosec at night (Omeprazole)

## 2018-11-22 ENCOUNTER — Ambulatory Visit (INDEPENDENT_AMBULATORY_CARE_PROVIDER_SITE_OTHER): Payer: BLUE CROSS/BLUE SHIELD | Admitting: Family Medicine

## 2018-11-22 ENCOUNTER — Encounter: Payer: Self-pay | Admitting: Family Medicine

## 2018-11-22 VITALS — BP 124/86 | Temp 98.1°F | Ht 70.0 in | Wt 257.2 lb

## 2018-11-22 DIAGNOSIS — M7711 Lateral epicondylitis, right elbow: Secondary | ICD-10-CM

## 2018-11-22 MED ORDER — METHYLPREDNISOLONE ACETATE 40 MG/ML IJ SUSP: 20.0000 mg | Freq: Once | INTRAMUSCULAR | Status: DC

## 2018-11-22 NOTE — Progress Notes (Signed)
   Subjective:    Patient ID: [name], male    DOB: January 28, 1986, 32 y.o.   MRN: [id number]  HPI Pt here today for elbow pain going on for about 4 weeks. Pt states his elbow has been waking him up in the middle of the night. Pt does dry wall work. Pt also states that shoulder has been hurting also.   Still having bad pain   Deep ache    Shoulder painful and achey    E   No hx of elbow difficulties     Doing drywall , off and on     Not get bdtter, no otc meds    h  A s more than usual    Frontal steay non throbbing    Worse when whorking ,doin orver time     Pt has had lot more headaches than usual. No treatments tried.    Review of Systems No headache, no major weight loss or weight gain, no chest pain no back pain abdominal pain no change in bowel habits complete ROS otherwise negative Motion joints    Objective:   Physical Exam Alert vitals stable, NAD. Blood pressure good on repeat. HEENT normal. Lungs clear. Heart regular rate and rhythm. Lateral elbow tenderness discrete and substantial  Patient with injected with 1/2 cc Depo-Medrol 1 cc Xylocaine.  Local measures discussed.  Recommend forearm strap diagnosis lateral epicondylitis       Assessment & Plan:

## 2019-02-27 ENCOUNTER — Other Ambulatory Visit: Payer: Self-pay

## 2019-02-27 ENCOUNTER — Encounter: Payer: Self-pay | Admitting: Family Medicine

## 2019-02-27 ENCOUNTER — Ambulatory Visit: Payer: BLUE CROSS/BLUE SHIELD | Admitting: Family Medicine

## 2019-02-27 VITALS — BP 120/80 | Temp 98.7°F | Ht 70.0 in | Wt 261.0 lb

## 2019-02-27 DIAGNOSIS — K439 Ventral hernia without obstruction or gangrene: Secondary | ICD-10-CM

## 2019-02-27 NOTE — Progress Notes (Signed)
   Subjective:    Patient ID: [name], male    DOB: 05-May-1986, 33 y.o.   MRN: [id number]  HPI Patient is here today with complaints of a knot above his umbilical area.  He noticed this yesterday, the area hurts to touch.  He states he does a lot of heavy lifting daily.  He has not been taking any pain medications for this problem.   Review of Systems No headache, no major weight loss or weight gain, no chest pain no back pain abdominal pain no change in bowel habits complete ROS otherwise negative     Objective:   Physical Exam   Alert vitals stable, NAD. Blood pressure good on repeat. HEENT normal. Lungs clear. Heart regular rate and rhythm. abdomen tender region above umbilical palpable defect.  No palpable incarcerated bowel at this time.     Assessment & Plan:  Impression highly symptomatic epiploic abdominal hernia question partial incarceration yesterday, not an acute abdomen today, does not need ER evaluation today but will do surgery referral

## 2019-02-28 ENCOUNTER — Telehealth: Payer: Self-pay | Admitting: Family Medicine

## 2019-02-28 NOTE — Telephone Encounter (Signed)
Discussed with pt. Pt verbalized understanding.  °

## 2019-02-28 NOTE — Telephone Encounter (Signed)
Patient wanted you to know that Dr.Lindsey Bridge to him to call back in 4 to 6 week to get appointment. He wanting to know what to do. Please advise

## 2019-02-28 NOTE — Telephone Encounter (Signed)
Unless his stomach getw to hurting real bad I think that is reasonable fo now, if he were to get a fver or statt vomiting or have really bad abd pain hed have to tgo to the ER for urgent eval

## 2019-03-08 ENCOUNTER — Ambulatory Visit: Payer: BLUE CROSS/BLUE SHIELD | Admitting: General Surgery

## 2019-03-19 ENCOUNTER — Other Ambulatory Visit: Payer: Self-pay

## 2019-03-19 ENCOUNTER — Ambulatory Visit: Payer: BLUE CROSS/BLUE SHIELD | Admitting: Family Medicine

## 2019-03-19 ENCOUNTER — Telehealth: Payer: Self-pay | Admitting: Family Medicine

## 2019-03-19 ENCOUNTER — Encounter: Payer: Self-pay | Admitting: Family Medicine

## 2019-03-19 VITALS — Temp 98.8°F

## 2019-03-19 DIAGNOSIS — M7711 Lateral epicondylitis, right elbow: Secondary | ICD-10-CM | POA: Diagnosis not present

## 2019-03-19 NOTE — Telephone Encounter (Signed)
Yes see tod

## 2019-03-19 NOTE — Telephone Encounter (Signed)
Pt is wanting to know if he can get another injection in his right elbow either today or tomorrow.    [phone]

## 2019-03-19 NOTE — Progress Notes (Signed)
   Subjective:    Patient ID: [name], male    DOB: October 20, 1986, 33 y.o.   MRN: [id number]  HPI Right elbow pain for about 4 weeks. Pt wants another injection. States last one was December.   Virtual Visit via Telephone Note  I connected with [name] on 03/19/19 at  1:10 PM EDT by telephone and verified that I am speaking with the correct person using two identifiers.   I discussed the limitations, risks, security and privacy concerns of performing an evaluation and management service by telephone and the availability of in person appointments. I also discussed with the patient that there may be a patient responsible charge related to this service. The patient expressed understanding and agreed to proceed.   History of Present Illness:    Observations/Objective:   Assessment and Plan:   Follow Up Instructions:    I discussed the assessment and treatment plan with the patient. The patient was provided an opportunity to ask questions and all were answered. The patient agreed with the plan and demonstrated an understanding of the instructions.   The patient was advised to call back or seek an in-person evaluation if the symptoms worsen or if the condition fails to improve as anticipated.  I provided 15 minutes of non-face-to-face time during this encounter.      Review of Systems     Objective:   Physical Exam  Alert vitals stable, NAD. Blood pressure good on repeat. HEENT normal. Lungs clear. Heart regular rate and rhythm. Exquisitely tender right lateral elbow.  Patient was prepped draped anesthetized and injected with half cc Depo-Medrol and 1 cc plain Xylocaine local measures discussed      Assessment & Plan:  Impression lateral epicondylitis plan forearm strap PRN.  Adjust activities if possible.  Injection helped last time

## 2019-03-19 NOTE — Telephone Encounter (Signed)
Pt states elbow is really tender to touch and is hardly able to use the elbow.  More he uses the elbow the worse the pain the gets. Pt states this has been going on for about 2 weeks. Please advise. Thank you

## 2019-03-26 MED ORDER — METHYLPREDNISOLONE ACETATE 40 MG/ML IJ SUSP: 20.0000 mg | Freq: Once | INTRAMUSCULAR | Status: DC

## 2019-04-17 ENCOUNTER — Encounter: Payer: Self-pay | Admitting: General Surgery

## 2019-04-17 ENCOUNTER — Other Ambulatory Visit: Payer: Self-pay

## 2019-04-17 ENCOUNTER — Ambulatory Visit: Payer: BLUE CROSS/BLUE SHIELD | Admitting: General Surgery

## 2019-04-17 VITALS — BP 132/86 | HR 60 | Temp 99.3°F | Resp 18 | Wt 260.0 lb

## 2019-04-17 DIAGNOSIS — K432 Incisional hernia without obstruction or gangrene: Secondary | ICD-10-CM | POA: Diagnosis not present

## 2019-04-17 NOTE — Progress Notes (Signed)
[facility] History and Physical  Reason for Referral: Incisional hernia  Referring Physician:  Dr. Wolfgang Phoenix   Chief Complaint    Pre-op Exam      [name] is a 33 y.o. male.  HPI:  [name] is a very pleasant 33 yo male who had issues with his incisional hernia being painful and stuck out a few weeks ago. He says that for about a week the hernia was stuck out and hurting and he finally started to have some improvement by the time he saw Dr. Wolfgang Phoenix. They made the plan to refer him to surgery instead of going to the ED that day, and he has since remained somewhat sore but has not had the same pain as he had prior.  He did not have any other associated symptoms like nausea/vomiting or changes in his bowel movements.  He says that he had cholecystectomy and appendectomy in [place] in the past.   Past Medical History:  Diagnosis Date  . Arthritis   . Elevated LFTs    mildly elevated transaminases in past, now normalized   . GERD (gastroesophageal reflux disease)   . Gout   . History of kidney stones     Past Surgical History:  Procedure Laterality Date  . APPENDECTOMY  01/27/2015  . CHOLECYSTECTOMY    . COLONOSCOPY WITH ESOPHAGOGASTRODUODENOSCOPY (EGD) N/A 02/25/2014   NL TI, 2 SIMPLE ADENOMAS(1:>1 CM), LGE IH-FG POLYPS, PROMINENT AMPULLA. Needs Colonoscopy surveillance in 2018  . ESOPHAGEAL DILATION N/A 08/30/2018   Procedure: ESOPHAGEAL DILATION;  Surgeon: Rogene Houston, MD;  [facility];  Service: Endoscopy;  Laterality: N/A;  . ESOPHAGOGASTRODUODENOSCOPY N/A 04/29/2015   Dr. Oneida Alar: 1. mild non-erosive gastritis (inflammation) was found in the gastric antrum. 2. Prominent Ampullla 60mmx 12mm. benign path with pyloric metaplasia  . ESOPHAGOGASTRODUODENOSCOPY N/A 07/12/2016   Procedure: ESOPHAGOGASTRODUODENOSCOPY (EGD);  Surgeon: Danie Binder, MD;  [facility];  Service: Endoscopy;  Laterality: N/A;  830  . ESOPHAGOGASTRODUODENOSCOPY N/A  08/30/2018   Procedure: ESOPHAGOGASTRODUODENOSCOPY (EGD);  Surgeon: Rogene Houston, MD;  [facility];  Service: Endoscopy;  Laterality: N/A;  2:00  . EUS N/A 03/20/2014   Dr. Paulita Fujita: prominent major papilla s/p biopsy, minor papilla without clear adenomatous or mass-like appearance, chronic duodenitis on path.   Marland Kitchen KIDNEY STONE SURGERY  age 13 and age 57   lithotripsy, stent  . SAVORY DILATION N/A 07/12/2016   Procedure: SAVORY DILATION;  Surgeon: Danie Binder, MD;  [facility]: [facility];  Service: Endoscopy;  Laterality: N/A;  . WISDOM TOOTH EXTRACTION      Family History  Problem Relation Age of Onset  . Diabetes Father   . COPD Father   . Colon cancer Neg Hx        does not know paternal side    Social History   Tobacco Use  . Smoking status: Never Smoker  . Smokeless tobacco: Never Used  Substance Use Topics  . Alcohol use: No  . Drug use: No    Medications: I have reviewed the patient's current medications. Allergies as of 04/17/2019      Reactions   Phenergan [promethazine Hcl] Other (See Comments)   arn swelled and hurt for 3 days, pt would rather not have      Medication List       Accurate as of Apr 17, 2019 12:08 PM. Always use your most recent med list.  allopurinol 300 MG tablet Commonly known as:  ZYLOPRIM Take 1 tablet (300 mg total) by mouth 2 (two) times daily.   pantoprazole 40 MG tablet Commonly known as:  PROTONIX TAKE ONE TABLET BY MOUTH TWICE DAILY BEFORE MEALS        ROS:  A comprehensive review of systems was negative except for: Gastrointestinal: positive for abdominal pain, nausea and reflux symptoms Musculoskeletal: positive for back pain and stiff joints  Blood pressure 132/86, pulse 60, temperature 99.3 F (37.4 C), temperature source Temporal, resp. rate 18, weight 260 lb (117.9 kg), SpO2 97 %. Physical Exam Vitals signs reviewed.  HENT:     Head: Normocephalic.     Nose: Nose normal.     Mouth/Throat:      Mouth: Mucous membranes are moist.  Eyes:     Pupils: Pupils are equal, round, and reactive to light.  Neck:     Musculoskeletal: Normal range of motion.  Cardiovascular:     Rate and Rhythm: Normal rate and regular rhythm.  Pulmonary:     Effort: Pulmonary effort is normal.     Breath sounds: Normal breath sounds.  Abdominal:     General: There is no distension.     Palpations: Abdomen is soft.     Tenderness: There is no abdominal tenderness.     Hernia: A hernia is present.     Comments: Incisional hernia above the umbilicus at the prior cholecystectomy extraction site, reducible, tender, defect about 2-3 cm  Musculoskeletal: Normal range of motion.        General: No swelling.  Skin:    General: Skin is warm and dry.  Neurological:     General: No focal deficit present.     Mental Status: He is alert and oriented to person, place, and time.  Psychiatric:        Mood and Affect: Mood normal.        Behavior: Behavior normal.        Thought Content: Thought content normal.        Judgment: Judgment normal.     Results: None   Assessment & Plan:  [name] is a 33 y.o. male with an incisional hernia just above the umbilicus from his cholecystectomy. He is not having any incarceration symptoms now but is still tender.  He had no obstructive symptoms. We discussed that this is not an emergency but that if he continues to have issues that he needs to go to the ED with the hernia being stuck out and hard due to incarceration and strangulation.  We discussed that we are slowly starting to do more elective type surgeries in the next few weeks and that he would be more urgent given his symptoms.  He is going to talk to his wife and discuss timing. He has an 39 week old and says that he is almost 10 lbs.  He will not be able to lift > 10 lbs for 8 weeks following surgery.  We discussed an open surgery with mesh and that if it recurred he would not a laparoscopic surgery with a  larger mesh.   -The patient has no sick contacts or symptoms of fever, cough, SOB related to COVID 19.   All questions were answered to the satisfaction of the patient.  The risk and benefits of open hernia repair with mesh were discussed including but not limited to bleeding, infection, injury to bowel, risk of recurrence.  After careful consideration, [name]  has decided to proceed once he has discussed with his wife.    Virl Cagey 04/17/2019, 12:08 PM

## 2019-04-17 NOTE — Patient Instructions (Signed)
Open Hernia Repair, Adult  Open hernia repair is a surgical procedure to fix a hernia. A hernia occurs when an internal organ or tissue pushes out through a weak spot in the abdominal wall muscles. Hernias commonly occur in the groin and around the navel. Most hernias tend to get worse over time. Often, surgery is done to prevent the hernia from becoming bigger, uncomfortable, or an emergency. Emergency surgery may be needed if abdominal contents get stuck in the opening (incarcerated hernia) or the blood supply gets cut off (strangulated hernia). In an open repair, an incision is made in the abdomen to perform the surgery. Tell a health care provider about:  Any allergies you have.  All medicines you are taking, including vitamins, herbs, eye drops, creams, and over-the-counter medicines.  Any problems you or family members have had with anesthetic medicines.  Any blood or bone disorders you have.  Any surgeries you have had.  Any medical conditions you have, including any recent cold or flu symptoms.  Whether you are pregnant or may be pregnant. What are the risks? Generally, this is a safe procedure. However, problems may occur, including:  Long-lasting (chronic) pain.  Bleeding.  Infection.  Damage to the testicle. This can cause shrinking or swelling.  Damage to the bladder, blood vessels, intestine, or nerves near the hernia.  Trouble passing urine.  Allergic reactions to medicines.  Return of the hernia.  General instructions  You may have blood tests or imaging studies.  Ask your health care provider how your surgical site will be marked or identified.  If you smoke, do not smoke for at least 2 weeks before your procedure or for as long as told by your health care provider.  Let your health care provider know if you develop a cold or any infection before your surgery.  Plan to have someone take you home from the hospital or clinic.  If you will be going home  right after the procedure, plan to have someone with you for 24 hours. What happens during the procedure?  To reduce your risk of infection: ? Your health care team will wash or sanitize their hands. ? Your skin will be washed with soap. ? Hair may be removed from the surgical area.  An IV tube will be inserted into one of your veins.  You will be given one or more of the following: ? A medicine to help you relax (sedative). ? A medicine to numb the area (local anesthetic). ? A medicine to make you fall asleep (general anesthetic).  Your surgeon will make an incision over the hernia.  The tissues of the hernia will be moved back into place.  The edges of the hernia may be stitched together.  The opening in the abdominal muscles will be closed with stitches (sutures). Or, your surgeon will place a mesh patch made of manmade (synthetic) material over the opening.  The incision will be closed.  A bandage (dressing) may be placed over the incision. The procedure may vary among health care providers and hospitals. What happens after the procedure?  Your blood pressure, heart rate, breathing rate, and blood oxygen level will be monitored until the medicines you were given have worn off.  You may be given medicine for pain.  Do not drive for 24 hours if you received a sedative. This information is not intended to replace advice given to you by your health care provider. Make sure you discuss any questions you have with your  health care provider. Document Released: 05/25/2001 Document Revised: 06/18/2016 Document Reviewed: 05/12/2016 Elsevier Interactive Patient Education  2019 [employer].

## 2019-04-24 NOTE — H&P (Signed)
[facility] History and Physical  Reason for Referral: Incisional hernia  Referring Physician:  Dr. Wolfgang Phoenix      Chief Complaint    Pre-op Exam      TREI SCHOCH is a 33 y.o. male.  HPI:  Mr. Seavey is a very pleasant 33 yo male who had issues with his incisional hernia being painful and stuck out a few weeks ago. He says that for about a week the hernia was stuck out and hurting and he finally started to have some improvement by the time he saw Dr. Wolfgang Phoenix. They made the plan to refer him to surgery instead of going to the ED that day, and he has since remained somewhat sore but has not had the same pain as he had prior.  He did not have any other associated symptoms like nausea/vomiting or changes in his bowel movements.  He says that he had cholecystectomy and appendectomy in [place] in the past.       Past Medical History:  Diagnosis Date  . Arthritis   . Elevated LFTs    mildly elevated transaminases in past, now normalized   . GERD (gastroesophageal reflux disease)   . Gout   . History of kidney stones          Past Surgical History:  Procedure Laterality Date  . APPENDECTOMY  01/27/2015  . CHOLECYSTECTOMY    . COLONOSCOPY WITH ESOPHAGOGASTRODUODENOSCOPY (EGD) N/A 02/25/2014   NL TI, 2 SIMPLE ADENOMAS(1:>1 CM), LGE IH-FG POLYPS, PROMINENT AMPULLA. Needs Colonoscopy surveillance in 2018  . ESOPHAGEAL DILATION N/A 08/30/2018   Procedure: ESOPHAGEAL DILATION;  Surgeon: Rogene Houston, MD;  [facility];  Service: Endoscopy;  Laterality: N/A;  . ESOPHAGOGASTRODUODENOSCOPY N/A 04/29/2015   Dr. Oneida Alar: 1. mild non-erosive gastritis (inflammation) was found in the gastric antrum. 2. Prominent Ampullla 73mmx 20mm. benign path with pyloric metaplasia  . ESOPHAGOGASTRODUODENOSCOPY N/A 07/12/2016   Procedure: ESOPHAGOGASTRODUODENOSCOPY (EGD);  Surgeon: Danie Binder, MD;  [facility];  Service: Endoscopy;  Laterality: N/A;   830  . ESOPHAGOGASTRODUODENOSCOPY N/A 08/30/2018   Procedure: ESOPHAGOGASTRODUODENOSCOPY (EGD);  Surgeon: Rogene Houston, MD;  [facility];  Service: Endoscopy;  Laterality: N/A;  2:00  . EUS N/A 03/20/2014   Dr. Paulita Fujita: prominent major papilla s/p biopsy, minor papilla without clear adenomatous or mass-like appearance, chronic duodenitis on path.   Marland Kitchen KIDNEY STONE SURGERY  age 55 and age 49   lithotripsy, stent  . SAVORY DILATION N/A 07/12/2016   Procedure: SAVORY DILATION;  Surgeon: Danie Binder, MD;  [facility];  Service: Endoscopy;  Laterality: N/A;  . WISDOM TOOTH EXTRACTION           Family History  Problem Relation Age of Onset  . Diabetes Father   . COPD Father   . Colon cancer Neg Hx        does not know paternal side    Social History       Tobacco Use  . Smoking status: Never Smoker  . Smokeless tobacco: Never Used  Substance Use Topics  . Alcohol use: No  . Drug use: No    Medications: I have reviewed the patient's current medications.      Allergies as of 04/17/2019      Reactions   Phenergan [promethazine Hcl] Other (See Comments)   arn swelled and hurt for 3 days, pt would rather not have         Medication  List       Accurate as of Apr 17, 2019 12:08 PM. Always use your most recent med list.        allopurinol 300 MG tablet Commonly known as:  ZYLOPRIM Take 1 tablet (300 mg total) by mouth 2 (two) times daily.   pantoprazole 40 MG tablet Commonly known as:  PROTONIX TAKE ONE TABLET BY MOUTH TWICE DAILY BEFORE MEALS        ROS:  A comprehensive review of systems was negative except for: Gastrointestinal: positive for abdominal pain, nausea and reflux symptoms Musculoskeletal: positive for back pain and stiff joints  Blood pressure 132/86, pulse 60, temperature 99.3 F (37.4 C), temperature source Temporal, resp. rate 18, weight 260 lb (117.9 kg), SpO2 97 %. Physical Exam Vitals  signs reviewed.  HENT:     Head: Normocephalic.     Nose: Nose normal.     Mouth/Throat:     Mouth: Mucous membranes are moist.  Eyes:     Pupils: Pupils are equal, round, and reactive to light.  Neck:     Musculoskeletal: Normal range of motion.  Cardiovascular:     Rate and Rhythm: Normal rate and regular rhythm.  Pulmonary:     Effort: Pulmonary effort is normal.     Breath sounds: Normal breath sounds.  Abdominal:     General: There is no distension.     Palpations: Abdomen is soft.     Tenderness: There is no abdominal tenderness.     Hernia: A hernia is present.     Comments: Incisional hernia above the umbilicus at the prior cholecystectomy extraction site, reducible, tender, defect about 2-3 cm  Musculoskeletal: Normal range of motion.        General: No swelling.  Skin:    General: Skin is warm and dry.  Neurological:     General: No focal deficit present.     Mental Status: He is alert and oriented to person, place, and time.  Psychiatric:        Mood and Affect: Mood normal.        Behavior: Behavior normal.        Thought Content: Thought content normal.        Judgment: Judgment normal.     Results: None   Assessment & Plan:  DEGAN HANSER is a 33 y.o. male with an incisional hernia just above the umbilicus from his cholecystectomy. He is not having any incarceration symptoms now but is still tender.  He had no obstructive symptoms. We discussed that this is not an emergency but that if he continues to have issues that he needs to go to the ED with the hernia being stuck out and hard due to incarceration and strangulation.  We discussed that we are slowly starting to do more elective type surgeries in the next few weeks and that he would be more urgent given his symptoms.  He is going to talk to his wife and discuss timing. He has an 47 week old and says that he is almost 10 lbs.  He will not be able to lift > 10 lbs for 8 weeks following surgery.  We  discussed an open surgery with mesh and that if it recurred he would not a laparoscopic surgery with a larger mesh.   -The patient has no sick contacts or symptoms of fever, cough, SOB related to COVID 19.   All questions were answered to the satisfaction of the patient.  The risk and  benefits of open hernia repair with mesh were discussed including but not limited to bleeding, infection, injury to bowel, risk of recurrence.  After careful consideration, ELMAN DETTMAN has decided to proceed once he has discussed with his wife.    Virl Cagey 04/17/2019, 12:08 PM

## 2019-04-27 ENCOUNTER — Encounter (HOSPITAL_COMMUNITY)
Admission: RE | Admit: 2019-04-27 | Discharge: 2019-04-27 | Disposition: A | Discharge status: Home / Self Care | Payer: BLUE CROSS/BLUE SHIELD | Source: Ambulatory Visit | Attending: General Surgery | Admitting: General Surgery

## 2019-04-27 ENCOUNTER — Other Ambulatory Visit: Payer: Self-pay

## 2019-04-27 ENCOUNTER — Other Ambulatory Visit (HOSPITAL_COMMUNITY)
Admission: RE | Admit: 2019-04-27 | Discharge: 2019-04-27 | Disposition: A | Discharge status: Home / Self Care | Payer: BLUE CROSS/BLUE SHIELD | Source: Ambulatory Visit | Attending: General Surgery | Admitting: General Surgery

## 2019-04-27 ENCOUNTER — Encounter (HOSPITAL_COMMUNITY): Payer: Self-pay

## 2019-04-27 DIAGNOSIS — Z1159 Encounter for screening for other viral diseases: Secondary | ICD-10-CM | POA: Diagnosis not present

## 2019-04-28 LAB — NOVEL CORONAVIRUS, NAA (HOSP ORDER, SEND-OUT TO REF LAB; TAT 18-24 HRS): SARS-CoV-2, NAA: NOT DETECTED

## 2019-05-02 ENCOUNTER — Encounter (HOSPITAL_COMMUNITY): Payer: Self-pay

## 2019-05-02 ENCOUNTER — Encounter (HOSPITAL_COMMUNITY)
Admission: RE | Disposition: A | Discharge status: Home / Self Care | Payer: Self-pay | Source: Home / Self Care | Attending: General Surgery

## 2019-05-02 ENCOUNTER — Ambulatory Visit (HOSPITAL_COMMUNITY): Payer: BLUE CROSS/BLUE SHIELD | Admitting: Anesthesiology

## 2019-05-02 ENCOUNTER — Other Ambulatory Visit: Payer: Self-pay

## 2019-05-02 ENCOUNTER — Ambulatory Visit (HOSPITAL_COMMUNITY)
Admission: RE | Admit: 2019-05-02 | Discharge: 2019-05-02 | Disposition: A | Discharge status: Home / Self Care | Payer: BLUE CROSS/BLUE SHIELD | Attending: General Surgery | Admitting: General Surgery

## 2019-05-02 DIAGNOSIS — M109 Gout, unspecified: Secondary | ICD-10-CM | POA: Insufficient documentation

## 2019-05-02 DIAGNOSIS — Z79899 Other long term (current) drug therapy: Secondary | ICD-10-CM | POA: Insufficient documentation

## 2019-05-02 DIAGNOSIS — M1990 Unspecified osteoarthritis, unspecified site: Secondary | ICD-10-CM | POA: Insufficient documentation

## 2019-05-02 DIAGNOSIS — K219 Gastro-esophageal reflux disease without esophagitis: Secondary | ICD-10-CM | POA: Diagnosis not present

## 2019-05-02 DIAGNOSIS — Z888 Allergy status to other drugs, medicaments and biological substances status: Secondary | ICD-10-CM | POA: Diagnosis not present

## 2019-05-02 DIAGNOSIS — K432 Incisional hernia without obstruction or gangrene: Secondary | ICD-10-CM | POA: Diagnosis not present

## 2019-05-02 HISTORY — PX: INCISIONAL HERNIA REPAIR: SHX193

## 2019-05-02 SURGERY — REPAIR, HERNIA, INCISIONAL
Anesthesia: General | Site: Abdomen

## 2019-05-02 MED ORDER — DEXAMETHASONE SODIUM PHOSPHATE 10 MG/ML IJ SOLN
INTRAMUSCULAR | Status: AC
  Filled 2019-05-02: qty 1

## 2019-05-02 MED ORDER — CHLORHEXIDINE GLUCONATE CLOTH 2 % EX PADS: 6.0000 | MEDICATED_PAD | Freq: Once | CUTANEOUS | Status: DC

## 2019-05-02 MED ORDER — MEPERIDINE HCL 50 MG/ML IJ SOLN: 6.2500 mg | INTRAMUSCULAR | Status: DC | PRN

## 2019-05-02 MED ORDER — PROMETHAZINE HCL 25 MG/ML IJ SOLN: 6.2500 mg | INTRAMUSCULAR | Status: DC | PRN

## 2019-05-02 MED ORDER — BUPIVACAINE LIPOSOME 1.3 % IJ SUSP
INTRAMUSCULAR | Status: AC
  Filled 2019-05-02: qty 20

## 2019-05-02 MED ORDER — PROPOFOL 10 MG/ML IV BOLUS
INTRAVENOUS | Status: AC
  Filled 2019-05-02: qty 20

## 2019-05-02 MED ORDER — DOCUSATE SODIUM 100 MG PO CAPS: 100.0000 mg | ORAL_CAPSULE | Freq: Two times a day (BID) | ORAL | 2 refills | Status: DC

## 2019-05-02 MED ORDER — FENTANYL CITRATE (PF) 100 MCG/2ML IJ SOLN
INTRAMUSCULAR | Status: DC | PRN
  Administered 2019-05-02 (×4): 50 ug via INTRAVENOUS

## 2019-05-02 MED ORDER — BUPIVACAINE LIPOSOME 1.3 % IJ SUSP
INTRAMUSCULAR | Status: DC | PRN
  Administered 2019-05-02: 20 mL

## 2019-05-02 MED ORDER — LACTATED RINGERS IV SOLN
INTRAVENOUS | Status: DC
  Administered 2019-05-02: 10:00:00 via INTRAVENOUS

## 2019-05-02 MED ORDER — HYDROMORPHONE HCL 1 MG/ML IJ SOLN
0.2500 mg | INTRAMUSCULAR | Status: DC | PRN
  Administered 2019-05-02 (×2): 0.5 mg via INTRAVENOUS
  Filled 2019-05-02 (×2): qty 0.5

## 2019-05-02 MED ORDER — MIDAZOLAM HCL 5 MG/5ML IJ SOLN
INTRAMUSCULAR | Status: DC | PRN
  Administered 2019-05-02: 2 mg via INTRAVENOUS

## 2019-05-02 MED ORDER — PROPOFOL 10 MG/ML IV BOLUS
INTRAVENOUS | Status: DC | PRN
  Administered 2019-05-02: 30 mg via INTRAVENOUS
  Administered 2019-05-02: 200 mg via INTRAVENOUS

## 2019-05-02 MED ORDER — HYDROCODONE-ACETAMINOPHEN 7.5-325 MG PO TABS
1.0000 | ORAL_TABLET | Freq: Once | ORAL | Status: AC | PRN
  Administered 2019-05-02: 1 via ORAL
  Filled 2019-05-02: qty 1

## 2019-05-02 MED ORDER — DEXAMETHASONE SODIUM PHOSPHATE 10 MG/ML IJ SOLN
INTRAMUSCULAR | Status: DC | PRN
  Administered 2019-05-02: 8 mg via INTRAVENOUS

## 2019-05-02 MED ORDER — 0.9 % SODIUM CHLORIDE (POUR BTL) OPTIME
TOPICAL | Status: DC | PRN
  Administered 2019-05-02: 10:00:00 1000 mL

## 2019-05-02 MED ORDER — OXYCODONE HCL 5 MG PO TABS: 5.0000 mg | ORAL_TABLET | ORAL | 0 refills | Status: DC | PRN

## 2019-05-02 MED ORDER — CEFAZOLIN SODIUM-DEXTROSE 2-4 GM/100ML-% IV SOLN
2.0000 g | INTRAVENOUS | Status: AC
  Administered 2019-05-02: 2 g via INTRAVENOUS
  Filled 2019-05-02: qty 100

## 2019-05-02 MED ORDER — FENTANYL CITRATE (PF) 100 MCG/2ML IJ SOLN
INTRAMUSCULAR | Status: AC
  Filled 2019-05-02: qty 4

## 2019-05-02 MED ORDER — MIDAZOLAM HCL 2 MG/2ML IJ SOLN
INTRAMUSCULAR | Status: AC
  Filled 2019-05-02: qty 2

## 2019-05-02 MED ORDER — LACTATED RINGERS IV SOLN: INTRAVENOUS | Status: DC

## 2019-05-02 MED ORDER — LIDOCAINE HCL 1 % IJ SOLN
INTRAMUSCULAR | Status: DC | PRN
  Administered 2019-05-02: 60 mg via INTRADERMAL

## 2019-05-02 SURGICAL SUPPLY — 51 items
ADH SKN CLS APL DERMABOND .7 (GAUZE/BANDAGES/DRESSINGS) ×1
APL PRP STRL LF DISP 70% ISPRP (MISCELLANEOUS) ×1
BINDER ABDOMINAL 12 ML 46-62 (SOFTGOODS) ×1 IMPLANT
BLADE SURG SZ11 CARB STEEL (BLADE) ×1 IMPLANT
CHLORAPREP W/TINT 26 (MISCELLANEOUS) ×2 IMPLANT
CLOTH BEACON ORANGE TIMEOUT ST (SAFETY) ×2 IMPLANT
COVER LIGHT HANDLE STERIS (MISCELLANEOUS) ×4 IMPLANT
DECANTER SPIKE VIAL GLASS SM (MISCELLANEOUS) ×1 IMPLANT
DERMABOND ADVANCED (GAUZE/BANDAGES/DRESSINGS) ×1
DERMABOND ADVANCED .7 DNX12 (GAUZE/BANDAGES/DRESSINGS) IMPLANT
ELECT REM PT RETURN 9FT ADLT (ELECTROSURGICAL) ×2
ELECTRODE REM PT RTRN 9FT ADLT (ELECTROSURGICAL) ×1 IMPLANT
GAUZE SPONGE 4X4 12PLY STRL (GAUZE/BANDAGES/DRESSINGS) ×2 IMPLANT
GLOVE BIO SURGEON STRL SZ 6.5 (GLOVE) ×2 IMPLANT
GLOVE BIOGEL PI IND STRL 6.5 (GLOVE) ×1 IMPLANT
GLOVE BIOGEL PI IND STRL 7.0 (GLOVE) ×1 IMPLANT
GLOVE BIOGEL PI INDICATOR 6.5 (GLOVE) ×1
GLOVE BIOGEL PI INDICATOR 7.0 (GLOVE) ×1
GLOVE SURG SS PI 7.5 STRL IVOR (GLOVE) ×2 IMPLANT
GOWN STRL REUS W/ TWL LRG LVL3 (GOWN DISPOSABLE) ×1 IMPLANT
GOWN STRL REUS W/ TWL XL LVL3 (GOWN DISPOSABLE) ×1 IMPLANT
GOWN STRL REUS W/TWL LRG LVL3 (GOWN DISPOSABLE) ×2
GOWN STRL REUS W/TWL XL LVL3 (GOWN DISPOSABLE) ×2
INST SET MAJOR GENERAL (KITS) IMPLANT
INST SET MINOR GENERAL (KITS) ×2 IMPLANT
KIT TURNOVER KIT A (KITS) ×2 IMPLANT
MANIFOLD NEPTUNE II (INSTRUMENTS) ×2 IMPLANT
MESH VENTRALEX ST 8CM LRG (Mesh General) ×1 IMPLANT
NDL HYPO 21X1.5 SAFETY (NEEDLE) ×1 IMPLANT
NEEDLE HYPO 21X1.5 SAFETY (NEEDLE) ×2 IMPLANT
NS IRRIG 1000ML POUR BTL (IV SOLUTION) ×2 IMPLANT
PACK ABDOMINAL MAJOR (CUSTOM PROCEDURE TRAY) IMPLANT
PACK MINOR (CUSTOM PROCEDURE TRAY) ×2 IMPLANT
PAD ARMBOARD 7.5X6 YLW CONV (MISCELLANEOUS) ×2 IMPLANT
SET BASIN LINEN APH (SET/KITS/TRAYS/PACK) ×2 IMPLANT
STAPLER VISISTAT (STAPLE) IMPLANT
SUT ETHIBOND 0 MO6 C/R (SUTURE) ×2 IMPLANT
SUT NOVA NAB GS-21 1 T12 (SUTURE) IMPLANT
SUT NOVA NAB GS-22 2 2-0 T-19 (SUTURE) IMPLANT
SUT NOVA NAB GS-26 0 60 (SUTURE) IMPLANT
SUT PROLENE 0 CT 1 CR/8 (SUTURE) IMPLANT
SUT PROLENE 2 0 SH 30 (SUTURE) IMPLANT
SUT SILK 2 0 (SUTURE)
SUT SILK 2-0 18XBRD TIE 12 (SUTURE) IMPLANT
SUT VIC AB 2-0 CT1 27 (SUTURE) ×2
SUT VIC AB 2-0 CT1 TAPERPNT 27 (SUTURE) ×1 IMPLANT
SUT VIC AB 3-0 SH 27 (SUTURE) ×4
SUT VIC AB 3-0 SH 27X BRD (SUTURE) ×1 IMPLANT
SUT VIC AB 4-0 PS2 27 (SUTURE) ×1 IMPLANT
SUT VICRYL AB 2 0 TIES (SUTURE) IMPLANT
SYR 20CC LL (SYRINGE) ×2 IMPLANT

## 2019-05-02 NOTE — Progress Notes (Signed)
[facility]  Tried to call wife, call went straight to Voicemail. Everything went well.   Curlene Labrum, MD [facility] [address] [place], [place] [address] [phone] (office)

## 2019-05-02 NOTE — Transfer of Care (Addendum)
Immediate Anesthesia Transfer of Care Note  Patient: [name]  Procedure(s) Performed: HERNIA REPAIR OPEN INCISIONAL WITH MESH (N/A Abdomen)  Patient Location: PACU  Anesthesia Type:General  Level of Consciousness: drowsy and patient cooperative  Airway & Oxygen Therapy: Patient Spontanous Breathing and Patient connected to face mask oxygen  Post-op Assessment: Report given to RN, Post -op Vital signs reviewed and stable and Patient moving all extremities  Post vital signs: Reviewed and stable  Last Vitals:  Vitals Value Taken Time  BP    Temp    Pulse    Resp    SpO2      Last Pain:  Vitals:   05/02/19 0932  TempSrc: Oral  PainSc: 0-No pain         Complications: No apparent anesthesia complications

## 2019-05-02 NOTE — Anesthesia Postprocedure Evaluation (Signed)
Anesthesia Post Note  Patient: [name]  Procedure(s) Performed: HERNIA REPAIR OPEN INCISIONAL WITH MESH (N/A Abdomen)  Patient location during evaluation: PACU Anesthesia Type: General Level of consciousness: awake and alert and patient cooperative Pain management: pain level controlled Vital Signs Assessment: post-procedure vital signs reviewed and stable Respiratory status: spontaneous breathing, nonlabored ventilation and respiratory function stable Cardiovascular status: blood pressure returned to baseline Postop Assessment: no apparent nausea or vomiting Anesthetic complications: no     Last Vitals:  Vitals:   05/02/19 1215 05/02/19 1230  BP: 140/79 128/82  Pulse: 70 87  Resp: 19 20  Temp:    SpO2: 100% 95%    Last Pain:  Vitals:   05/02/19 1230  TempSrc:   PainSc: (P) 6                  Cartrell Bentsen J

## 2019-05-02 NOTE — Anesthesia Preprocedure Evaluation (Signed)
Anesthesia Evaluation    Airway Mallampati: II       Dental  (+) Teeth Intact   Pulmonary    breath sounds clear to auscultation       Cardiovascular  Rhythm:regular     Neuro/Psych    GI/Hepatic GERD  ,  Endo/Other    Renal/GU      Musculoskeletal   Abdominal   Peds  Hematology   Anesthesia Other Findings Gout LFTs increased, fatty liver, GERD, dysphagia Airway Grade II- but deep Phenergan burns on IV admin- requests NO phenergan  Reproductive/Obstetrics                             Anesthesia Physical Anesthesia Plan  ASA: II  Anesthesia Plan: General   Post-op Pain Management:    Induction:   PONV Risk Score and Plan:   Airway Management Planned:   Additional Equipment:   Intra-op Plan:   Post-operative Plan:   Informed Consent: I have reviewed the patients History and Physical, chart, labs and discussed the procedure including the risks, benefits and alternatives for the proposed anesthesia with the patient or authorized representative who has indicated his/her understanding and acceptance.     Dental Advisory Given  Plan Discussed with: Anesthesiologist  Anesthesia Plan Comments:         Anesthesia Quick Evaluation

## 2019-05-02 NOTE — Op Note (Signed)
[facility] Operative Note  05/02/19  Preoperative Diagnosis:  Incisional hernia    Postoperative Diagnosis: Same   Procedure(s) Performed: Incisional hernia repair with mesh (Ventralex ST Patch 8cm)   Surgeon: Dustin Matar. Constance Haw, MD   Assistants: No qualified resident was available    Anesthesia: General   Anesthesiologist: Dr. Currie Paris    Specimens: None    Estimated Blood Loss: Minimal   Blood Replacement: None    Complications: None   Wound Class: Clean    Operative Indications: [name] is a 33 yo with a history of a laparoscopic cholecystectomy with extraction of the gallbladder through the supraumbilical site. He has developed a hernia his that is reducible but is getting larger and causing him problems.  We discussed the risk of repair including but not limited to bleeding, infection, use of mesh, injury to bowel and recurrence, and he has opted to proceed.   Findings: 3cm defect supraumbilical   Procedure: The patient was taken to the operating room and placed supine. General anesthesia was induced. Intravenous antibiotics were administered per protocol.  An orogastric tube positioned to decompress the stomach. The abdomen was prepared and draped in the usual sterile fashion.   A supraumbilical incision was made in the midline through his prior laparoscopy site overlying an area of stretched out skin.  The incision was carried down into the subcutaneous tissue and the hernia was identified. The sac was opened and noted to contain omentum.  The omentum was reduced into the abdominal cavity and a few adhesions to the inferior edge of the hernia were taken down with cautery. Care was taken to protect any bowel.  The peritoneal lining was cleared from any omentum. A Ventralex ST Patch 8cm was placed and flattened out under the fascial edges. The mesh was secured with 0 Ethibond. The midline fascia was then closed over the mesh with 0 Ethibond suture in  the interrupted fashion. The deep space was closed with 3-0 Vicryl. The stretched skin was ellipsed out, and the skin was closed with a subcuticular 4-0 Monocryl suture. Dermabond was applied.   Final inspection revealed acceptable hemostasis. All counts were correct at the end of the case. The patient was awakened from anesthesia and extubated without complication.  The patient went to the PACU in stable condition.   Curlene Labrum, MD [facility] [address] [place], [place] [address] [phone] (office)

## 2019-05-02 NOTE — Interval H&P Note (Signed)
History and Physical Interval Note:  05/02/2019 9:56 AM  [name]  has presented today for surgery, with the diagnosis of incisional hernia.  The various methods of treatment have been discussed with the patient and family. After consideration of risks, benefits and other options for treatment, the patient has consented to  Procedure(s): HERNIA REPAIR OPEN INCISIONAL WITH MESH (N/A) as a surgical intervention.  The patient's history has been reviewed, patient examined, no change in status, stable for surgery.  I have reviewed the patient's chart and labs.  Questions were answered to the patient's satisfaction.    No changes. No questions.  Virl Cagey

## 2019-05-02 NOTE — Discharge Instructions (Signed)
Discharge Open Abdominal Surgery Instructions:  Common Complaints: Pain at the incision site is common.  This will improve with time. Take your pain medications as described below. Some nausea is common and poor appetite. The main goal is to stay hydrated the first few days after surgery.   Diet/ Activity: Diet as tolerated.  Shower per your regular routine daily.  Do not take hot showers. Take warm showers that are less than 10 minutes. Path the incision dry. Do not pick at the dermabond glue.  Wear an abdominal binder daily with activity. You do not have to wear this while sleeping or sitting.  Rest and listen to your body, but do not remain in bed all day.  Walk everyday for at least 15-20 minutes. Deep cough and move around every 1-2 hours in the first few days after surgery.  Do not lift > 10 lbs, perform excessive bending, pushing, pulling, squatting for 6-8 weeks after surgery.  The activity restrictions and the abdominal binder are to prevent hernia formation at your incision while you are healing.  Do not place lotions or balms on your incision unless instructed to specifically by Dr. Constance Haw.  You may drive if you are not using narcotic pain medication and if you are able to turn a stirring wheel and slam on the breaks without abdominal pain or restriction.   Medication: Take tylenol and ibuprofen as needed for pain control, alternating every 4-6 hours.  Example:  Tylenol 1000mg  @ 6am, 12noon, 6pm, 90midnight (Do not exceed 4000mg  of tylenol a day). Ibuprofen 800mg  @ 9am, 3pm, 9pm, 3am (Do not exceed 3600mg  of ibuprofen a day).  Take Roxicodone for breakthrough pain every 4 hours.  Take Colace for constipation related to narcotic pain medication. If you do not have a bowel movement in 2 days, take Miralax over the counter.  Drink plenty of water to also prevent constipation.   Contact Information: If you have questions or concerns, please call our office, [phone], Monday-  Thursday 8AM-5PM and Friday 8AM-12Noon.  If it is after hours or on the weekend, please call [facility] Main Number, [phone], and ask to speak to the surgeon on call for Dr. Constance Haw at [facility].    Open Hernia Repair, Adult, Care After These instructions give you information about caring for yourself after your procedure. Your doctor may also give you more specific instructions. If you have problems or questions, contact your doctor. Follow these instructions at home: Surgical cut (incision) care   Follow instructions from your doctor about how to take care of your surgical cut area. Make sure you: ? Wash your hands with soap and water before you change your bandage (dressing). If you cannot use soap and water, use hand sanitizer. ? Change your bandage as told by your doctor. ? Leave stitches (sutures), skin glue, or skin tape (adhesive) strips in place. They may need to stay in place for 2 weeks or longer. If tape strips get loose and curl up, you may trim the loose edges. Do not remove tape strips completely unless your doctor says it is okay.  Check your surgical cut every day for signs of infection. Check for: ? More redness, swelling, or pain. ? More fluid or blood. ? Warmth. ? Pus or a bad smell. Activity  Do not drive or use heavy machinery while taking prescription pain medicine. Do not drive until your doctor says it is okay.  Until your doctor says it is okay: ? Do not lift anything  that is heavier than 10 lb (4.5 kg). ? Do not play contact sports.  Return to your normal activities as told by your doctor. Ask your doctor what activities are safe. General instructions  To prevent or treat having a hard time pooping (constipation) while you are taking prescription pain medicine, your doctor may recommend that you: ? Drink enough fluid to keep your pee (urine) clear or pale yellow. ? Take over-the-counter or prescription medicines. ? Eat foods that are high in fiber, such  as fresh fruits and vegetables, whole grains, and beans. ? Limit foods that are high in fat and processed sugars, such as fried and sweet foods.  Take over-the-counter and prescription medicines only as told by your doctor.  Do not take baths, swim, or use a hot tub until your doctor says it is okay.  Keep all follow-up visits as told by your doctor. This is important. Contact a doctor if:  You develop a rash.  You have more redness, swelling, or pain around your surgical cut.  You have more fluid or blood coming from your surgical cut.  Your surgical cut feels warm to the touch.  You have pus or a bad smell coming from your surgical cut.  You have a fever or chills.  You have blood in your poop (stool).  You have not pooped in 2-3 days.  Medicine does not help your pain. Get help right away if:  You have chest pain or you are short of breath.  You feel light-headed.  You feel weak and dizzy (feel faint).  You have very bad pain.  You throw up (vomit) and your pain is worse. This information is not intended to replace advice given to you by your health care provider. Make sure you discuss any questions you have with your health care provider. Document Released: 12/20/2014 Document Revised: 06/18/2016 Document Reviewed: 05/12/2016 Elsevier Interactive Patient Education  2019 Start, Care After These instructions provide you with information about caring for yourself after your procedure. Your health care provider may also give you more specific instructions. Your treatment has been planned according to current medical practices, but problems sometimes occur. Call your health care provider if you have any problems or questions after your procedure. What can I expect after the procedure? After your procedure, you may:  Feel sleepy for several hours.  Feel clumsy and have poor balance for several hours.  Feel forgetful about what  happened after the procedure.  Have poor judgment for several hours.  Feel nauseous or vomit.  Have a sore throat if you had a breathing tube during the procedure. Follow these instructions at home: For at least 24 hours after the procedure:      Have a responsible adult stay with you. It is important to have someone help care for you until you are awake and alert.  Rest as needed.  Do not: ? Participate in activities in which you could fall or become injured. ? Drive. ? Use heavy machinery. ? Drink alcohol. ? Take sleeping pills or medicines that cause drowsiness. ? Make important decisions or sign legal documents. ? Take care of children on your own. Eating and drinking  Follow the diet that is recommended by your health care provider.  If you vomit, drink water, juice, or soup when you can drink without vomiting.  Make sure you have little or no nausea before eating solid foods. General instructions  Take over-the-counter and prescription  medicines only as told by your health care provider.  If you have sleep apnea, surgery and certain medicines can increase your risk for breathing problems. Follow instructions from your health care provider about wearing your sleep device: ? Anytime you are sleeping, including during daytime naps. ? While taking prescription pain medicines, sleeping medicines, or medicines that make you drowsy.  If you smoke, do not smoke without supervision.  Keep all follow-up visits as told by your health care provider. This is important. Contact a health care provider if:  You keep feeling nauseous or you keep vomiting.  You feel light-headed.  You develop a rash.  You have a fever. Get help right away if:  You have trouble breathing. Summary  For several hours after your procedure, you may feel sleepy and have poor judgment.  Have a responsible adult stay with you for at least 24 hours or until you are awake and alert. This  information is not intended to replace advice given to you by your health care provider. Make sure you discuss any questions you have with your health care provider. Document Released: 03/21/2016 Document Revised: 07/15/2017 Document Reviewed: 03/21/2016 Elsevier Interactive Patient Education  2019 [employer].

## 2019-05-02 NOTE — Anesthesia Procedure Notes (Signed)
Procedure Name: LMA Insertion Date/Time: 05/02/2019 10:35 AM Performed by: Charmaine Downs, CRNA Pre-anesthesia Checklist: Patient identified, Patient being monitored, Emergency Drugs available, Timeout performed and Suction available Patient Re-evaluated:Patient Re-evaluated prior to induction Oxygen Delivery Method: Circle System Utilized Preoxygenation: Pre-oxygenation with 100% oxygen Induction Type: IV induction Ventilation: Mask ventilation without difficulty LMA: LMA inserted LMA Size: 4.0 Number of attempts: 1 Placement Confirmation: positive ETCO2 and breath sounds checked- equal and bilateral Tube secured with: Tape Dental Injury: Teeth and Oropharynx as per pre-operative assessment

## 2019-05-02 NOTE — Anesthesia Postprocedure Evaluation (Signed)
Anesthesia Post Note  Patient: [name]  Procedure(s) Performed: HERNIA REPAIR OPEN INCISIONAL WITH MESH (N/A Abdomen)  Patient location during evaluation: PACU Anesthesia Type: General Level of consciousness: awake and alert and oriented Pain management: pain level controlled Vital Signs Assessment: post-procedure vital signs reviewed and stable Respiratory status: spontaneous breathing Cardiovascular status: blood pressure returned to baseline and stable Postop Assessment: no apparent nausea or vomiting Anesthetic complications: no     Last Vitals:  Vitals:   05/02/19 1215 05/02/19 1230  BP: 140/79 128/82  Pulse: 70 87  Resp: 19 20  Temp:    SpO2: 100% 95%    Last Pain:  Vitals:   05/02/19 1230  TempSrc:   PainSc: (P) 6                  [name]

## 2019-05-03 ENCOUNTER — Encounter (HOSPITAL_COMMUNITY): Payer: Self-pay | Admitting: General Surgery

## 2019-05-08 ENCOUNTER — Encounter: Payer: Self-pay | Admitting: General Surgery

## 2019-05-09 ENCOUNTER — Encounter: Payer: Self-pay | Admitting: General Surgery

## 2019-05-09 NOTE — Progress Notes (Signed)
Found the photos patient sent. No signs of infection. Looks like probably hematoma, continue with plan for clinic visit 05/15/19.  Curlene Labrum, MD

## 2019-05-09 NOTE — Progress Notes (Signed)
Patient says he is a little swollen in the right side. He is having a BMs and it is not bruised near the swollen spot but around the incision. No redness reported at the swelling.   When up and walking around it seems to protrude.  Otherwise no pain and feeling good.  Will plan to see him in person next Tuesday as this sounds like a hematoma. If he has any issues he will notify us. Discussed signs of infection and what to lo ok out for.  Curlene Labrum, MD

## 2019-05-15 ENCOUNTER — Ambulatory Visit (INDEPENDENT_AMBULATORY_CARE_PROVIDER_SITE_OTHER): Payer: Self-pay | Admitting: General Surgery

## 2019-05-15 ENCOUNTER — Encounter: Payer: Self-pay | Admitting: General Surgery

## 2019-05-15 ENCOUNTER — Other Ambulatory Visit: Payer: Self-pay

## 2019-05-15 ENCOUNTER — Ambulatory Visit: Payer: BLUE CROSS/BLUE SHIELD | Admitting: General Surgery

## 2019-05-15 VITALS — BP 136/98 | HR 58 | Temp 98.9°F | Resp 18 | Ht 71.0 in | Wt 261.0 lb

## 2019-05-15 DIAGNOSIS — K432 Incisional hernia without obstruction or gangrene: Secondary | ICD-10-CM

## 2019-05-15 NOTE — Progress Notes (Signed)
[facility] Note   33 y.o. Male presents to clinic for post-op follow-up evaluation after his incisional hernia repair. He had noticed some swelling on the right side of his abdomen and I was able to find the pictures in media after discussing with him. The area looks like a hematoma under the skin. He is having no pain and says the swelling is better. He has tried to do some raking but was sore after. He has been eating and drinking normally and having Bms.   Review of Systems:  No fever or chills No redness around his incision All other review of systems: otherwise negative   Vital Signs:  BP (!) 136/98 (BP Location: Left Arm, Patient Position: Sitting, Cuff Size: Normal)   Pulse (!) 58   Temp 98.9 F (37.2 C) (Temporal)   Resp 18   Ht 5\' 11"  (1.803 m)   Wt 261 lb (118.4 kg)   SpO2 98%   BMI 36.40 kg/m    Physical Exam:  Physical Exam Vitals signs reviewed.  HENT:     Head: Normocephalic.  Cardiovascular:     Rate and Rhythm: Normal rate.  Pulmonary:     Effort: Pulmonary effort is normal.  Abdominal:     Comments: Soft, incision healing, no erythema or drainage, right of midline knot, Induration consistent with hematoma, minimal tenderness  Neurological:     Mental Status: He is alert.    Assessment:  33 y.o. yo Male s/p incisional hernia repair. He is doing well and not having much pain. He is not taking narcotics. He is trying to work some but has not lifted anything and has been doing minor things like raking.   Plan:  Wear an abdominal binder daily with activity.  You do not have to wear this while sleeping or sitting.  Rest and listen to your body, but do not remain in bed all day.  Do not lift > 10 lbs, perform excessive bending, pushing, pulling, squatting for 6-8 weeks after surgery.  The activity restrictions and the abdominal binder are to prevent hernia formation at your incision while you are healing.  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  Curlene Labrum, MD [facility] [address] [place], [place] [address] [phone] (office)

## 2019-05-15 NOTE — Patient Instructions (Signed)
Wear an abdominal binder daily with activity.  You do not have to wear this while sleeping or sitting.  Rest and listen to your body, but do not remain in bed all day.  Do not lift > 10 lbs, perform excessive bending, pushing, pulling, squatting for 6-8 weeks after surgery.  The activity restrictions and the abdominal binder are to prevent hernia formation at your incision while you are healing.   Contact Information: If you have questions or concerns, please call our office, [phone], Monday- Thursday 8AM-5PM and Friday 8AM-12Noon.  If it is after hours or on the weekend, please call [facility] Main Number, [phone], and ask to speak to the surgeon on call for Dr. Constance Haw at [facility].

## 2019-05-17 ENCOUNTER — Encounter: Payer: Self-pay | Admitting: Family Medicine

## 2019-05-18 ENCOUNTER — Other Ambulatory Visit: Payer: Self-pay

## 2019-05-18 ENCOUNTER — Ambulatory Visit (INDEPENDENT_AMBULATORY_CARE_PROVIDER_SITE_OTHER): Payer: BC Managed Care – PPO | Admitting: Family Medicine

## 2019-05-18 DIAGNOSIS — I10 Essential (primary) hypertension: Secondary | ICD-10-CM

## 2019-05-18 MED ORDER — AMLODIPINE BESYLATE 5 MG PO TABS: ORAL_TABLET | ORAL | 1 refills | Status: DC

## 2019-05-18 NOTE — Telephone Encounter (Signed)
Pt contacted and transferred up front to set up virtual visit. Pt states he would rather do a virtual visit due to recently having hernia surgery.

## 2019-05-18 NOTE — Progress Notes (Signed)
   Subjective:    Patient ID: [name], male    DOB: [birth year], 33 y.o.   MRN: [id number] Audio plus visual HPI  Pt sent MyChart message: have been having issues with my blood pressure.. I have been having really bad headaches for about 2weeks now, well I started checking when my head started hurting and they have been high. When I had my sugery my blood pressure was high and my follow up appointment it was also high... I have sent you some pictures of my blood pressure..    Pt contacted; pt states no chest pain and no blurred vision. States there is some dizziness at times. Pt states he would rather do a virtual visit due to recently having hernia surgery and pt recently went back to work.  Virtual Visit via Video Note  I connected with [name] on 05/18/19 at  1:40 PM EDT by a video enabled telemedicine application and verified that I am speaking with the correct person using two identifiers.  Location: Patient: home Provider: office   I discussed the limitations of evaluation and management by telemedicine and the availability of in person appointments. The patient expressed understanding and agreed to proceed.  History of Present Illness:    Observations/Objective:   Assessment and Plan:   Follow Up Instructions:    I discussed the assessment and treatment plan with the patient. The patient was provided an opportunity to ask questions and all were answered. The patient agreed with the plan and demonstrated an understanding of the instructions.   The patient was advised to call back or seek an in-person evaluation if the symptoms worsen or if the condition fails to improve as anticipated.  I provided 20 minutes of non-face-to-face time during this encounter.   Vicente Males, LPN   Review of Systems No headache, no major weight loss or weight gain, no chest pain no back pain abdominal pain no change in bowel habits complete ROS otherwise negative      Objective:   Physical Exam Virtual       Assessment & Plan:  Impression hypertension.  Discussed at length.  Numbers consistently elevated last 6 months.  Time to initiate medicine rationale discussed medicine prescribed side effects benefits discussed

## 2019-07-20 ENCOUNTER — Other Ambulatory Visit: Payer: Self-pay

## 2019-07-20 ENCOUNTER — Encounter: Payer: Self-pay | Admitting: Family Medicine

## 2019-07-20 ENCOUNTER — Ambulatory Visit (INDEPENDENT_AMBULATORY_CARE_PROVIDER_SITE_OTHER): Payer: BC Managed Care – PPO | Admitting: Family Medicine

## 2019-07-20 ENCOUNTER — Encounter

## 2019-07-20 VITALS — BP 130/78 | Temp 98.2°F | Wt 263.8 lb

## 2019-07-20 DIAGNOSIS — M7711 Lateral epicondylitis, right elbow: Secondary | ICD-10-CM

## 2019-07-20 NOTE — Progress Notes (Signed)
   Subjective:    Patient ID: [name], male    DOB: March 17, 1986, 33 y.o.   MRN: [id number]  HPI Pt here today for right elbow pain. Pt states this is ongoing for 6 months. No injury known. Pt would like injection.   Ongoing right lateral epicondylitis with severe tooth achy-like pain Review of Systems No headache, no major weight loss or weight gain, no chest pain no back pain abdominal pain no change in bowel habits complete ROS otherwise negative     Objective:   Physical Exam   Alert vitals stable, NAD. Blood pressure good on repeat. HEENT normal. Lungs clear. Heart regular rate and rhythm. Distinct epicondyle tenderness.  Patient was prepped draped injected 1/2 cc Depo-Medrol 1 cc Xylocaine      Assessment & Plan:  Impression lateral epicondylitis injection plan follow-up as needed

## 2019-07-22 MED ORDER — METHYLPREDNISOLONE ACETATE 40 MG/ML IJ SUSP: 10.0000 mg | Freq: Once | INTRAMUSCULAR | Status: DC

## 2019-07-26 ENCOUNTER — Ambulatory Visit: Payer: BC Managed Care – PPO | Admitting: Family Medicine

## 2019-08-07 ENCOUNTER — Other Ambulatory Visit: Payer: Self-pay | Admitting: Family Medicine

## 2019-09-07 ENCOUNTER — Other Ambulatory Visit: Payer: Self-pay | Admitting: Family Medicine

## 2019-10-18 ENCOUNTER — Encounter: Payer: Self-pay | Admitting: General Surgery

## 2019-10-30 ENCOUNTER — Other Ambulatory Visit: Payer: Self-pay

## 2019-10-30 ENCOUNTER — Ambulatory Visit: Payer: BC Managed Care – PPO | Admitting: General Surgery

## 2019-10-30 ENCOUNTER — Encounter: Payer: Self-pay | Admitting: General Surgery

## 2019-10-30 VITALS — BP 142/84 | HR 67 | Temp 98.2°F | Resp 16 | Ht 71.0 in | Wt 260.0 lb

## 2019-10-30 DIAGNOSIS — K432 Incisional hernia without obstruction or gangrene: Secondary | ICD-10-CM | POA: Diagnosis not present

## 2019-10-30 NOTE — Patient Instructions (Addendum)
Will get CT. Will call with results.  Go to ED with worsening pain that does not resolve in a hour or bulge that is stuck out and hardened.

## 2019-10-30 NOTE — Progress Notes (Signed)
[facility] Note   HPI:  33 y.o. Male presents to clinic for evaluation of his umbilical area. He had a hernia repair a few months back and has been doing well but did notice a bulge and pain in the area [place] the [place]. He is worried about a seroma/ recurrence.   Review of Systems:  Pain at the umbilicus Some bulging but not constant  All other review of systems: otherwise negative   Vital Signs:  BP (!) 142/84 (BP Location: Right Arm, Patient Position: Sitting, Cuff Size: Normal)   Pulse 67   Temp 98.2 F (36.8 C) (Oral)   Resp 16   Ht 5\' 11"  (1.803 m)   Wt 260 lb (117.9 kg)   SpO2 98%   BMI 36.26 kg/m    Physical Exam:  Physical Exam Vitals signs reviewed.  Constitutional:      Appearance: Normal appearance.  Cardiovascular:     Rate and Rhythm: Normal rate and regular rhythm.  Abdominal:     General: There is no distension.     Palpations: Abdomen is soft.     Tenderness: There is abdominal tenderness.     Comments: Superior to umbilicus, no obvious hernia, tenderness, [place] used at bedside, no obvious defect of fascia but difficult to see, maybe possible seroma?   Neurological:     Mental Status: He is alert.      Assessment:  33 y.o. yo Male with a possible recurrence of his incisional hernia s/p repair. He has noticed some bulging and pain. Difficult to appreciate on exam.   Plan:  - CT abdomen to assess for recurrence   - Will call with results  - PRN follow up   All of the above recommendations were discussed with the patient, and all of patient's questions were answered to his expressed satisfaction.  Curlene Labrum, MD [facility] [address] [place],  [address] [phone] (office)

## 2019-11-01 ENCOUNTER — Ambulatory Visit (INDEPENDENT_AMBULATORY_CARE_PROVIDER_SITE_OTHER): Payer: BLUE CROSS/BLUE SHIELD | Admitting: Nurse Practitioner

## 2019-11-01 ENCOUNTER — Other Ambulatory Visit: Payer: Self-pay

## 2019-11-01 ENCOUNTER — Encounter (INDEPENDENT_AMBULATORY_CARE_PROVIDER_SITE_OTHER): Payer: Self-pay | Admitting: Nurse Practitioner

## 2019-11-01 ENCOUNTER — Encounter (INDEPENDENT_AMBULATORY_CARE_PROVIDER_SITE_OTHER): Payer: Self-pay | Admitting: *Deleted

## 2019-11-01 VITALS — BP 129/79 | HR 73 | Temp 97.9°F | Ht 70.0 in | Wt 260.8 lb

## 2019-11-01 DIAGNOSIS — K760 Fatty (change of) liver, not elsewhere classified: Secondary | ICD-10-CM

## 2019-11-01 DIAGNOSIS — Z86010 Personal history of colonic polyps: Secondary | ICD-10-CM

## 2019-11-01 DIAGNOSIS — K219 Gastro-esophageal reflux disease without esophagitis: Secondary | ICD-10-CM

## 2019-11-01 DIAGNOSIS — K625 Hemorrhage of anus and rectum: Secondary | ICD-10-CM | POA: Diagnosis not present

## 2019-11-01 MED ORDER — PANTOPRAZOLE SODIUM 40 MG PO TBEC: 40.0000 mg | DELAYED_RELEASE_TABLET | Freq: Two times a day (BID) | ORAL | 1 refills | Status: DC

## 2019-11-01 MED ORDER — PANTOPRAZOLE SODIUM 40 MG PO TBEC: 40.0000 mg | DELAYED_RELEASE_TABLET | Freq: Every day | ORAL | 2 refills | Status: DC

## 2019-11-01 NOTE — Progress Notes (Signed)
Subjective:    Patient ID: [name], male    DOB: [birth year], 33 y.o.   MRN: [id number]  HPI Keiren Delamarter is a 33 year old male with a past medical history of kidney stones, arthritis, fatty liver, dysphagia, GERD and colon polyps.  Past appendectomy and cholecystectomy.  He has frequent heartburn. He has dysphagia twice weekly. Food gets stuck briefly to his upper esophagus which resolves after he drinks water which pushes the food down. He has intermittent stomach cramping. He has intermittent bright red rectal bleeding. He reports seeing a small amount of bright red blood on the toilet tissue, in the toilet water and on the stool which comes and goes, last occurred 2 weeks ago. His most recent colonoscopy was 12/2016 which was normal. He had 1 large and 1 small tubular adenomatous polyp removed at the time of a colonoscopy in 2015. He had umbilical hernia surgery 03/2019 and he is having some pain around the hernia repair site. He is scheduled for an abdominal CT in the next week or two.   CBC Latest Ref Rng & Units 07/12/2016 06/23/2016 03/08/2016  WBC 4.0 - 10.5 K/uL 5.8 5.7 6.1  Hemoglobin 13.0 - 17.0 g/dL 15.4 15.6 15.5  Hematocrit 39.0 - 52.0 % 45.1 44.3 44.2  Platelets 150 - 400 K/uL 220 211 254   CMP Latest Ref Rng & Units 06/02/2018 04/18/2018 02/15/2017  Glucose 65 - 99 mg/dL 119(H) 96 87  BUN 6 - 20 mg/dL 12 13 14   Creatinine 0.76 - 1.27 mg/dL 1.14 1.43(H) 1.10  Sodium 134 - 144 mmol/L 140 145(H) 143  Potassium 3.5 - 5.2 mmol/L 4.0 3.9 4.2  Chloride 96 - 106 mmol/L 102 107(H) 101  CO2 20 - 29 mmol/L 25 21 25   Calcium 8.7 - 10.2 mg/dL 9.5 9.9 9.8  Total Protein 6.0 - 8.5 g/dL - 7.5 -  Total Bilirubin 0.0 - 1.2 mg/dL - 0.5 -  Alkaline Phos 39 - 117 IU/L - 71 -  AST 0 - 40 IU/L - 23 -  ALT 0 - 44 IU/L - 32 -    EGD  08/30/2018: - Normal esophagus. - Mils esophagitis at GEJ reflux esophagitis. - 2 cm hiatal hernia. - No endoscopic esophageal abnormality to explain  patient's dysphagia. Esophagus dilated.   Dilated. - Erosive gastropathy( note H.pylori and prior gastric biopsy negative for H.Pylori. - Small polyps at gastric body. Left alone. - Normal duodenal bulb. - Prominent major and minor papillae. - No specimens collected.  Colonoscopy 12/27/2016 by Dr. Anthony Sar: Normal   Colonoscopy 02/25/2014: -1 large tubular adenomatous polyp and stalk was removed from the sigmoid colon, no dysplasia -1 small tubular adenomatous polyps was removed from the sigmoid colon, no dysplasia.  EUS 03/20/2014 -Prominent ampulla, benign. Ampulla of Vater, biopsy, major papilla CHRONIC DUODENITIS CONSISTENT WITH PEPTIC DUODENITIS. NO VILLOUS ATROPHY, HELICOBACTER PYLORI OR MALIGNANCY IDENTIFIED  Past Medical History:  Diagnosis Date  . Arthritis   . Elevated LFTs    mildly elevated transaminases in past, now normalized   . GERD (gastroesophageal reflux disease)   . Gout   . History of kidney stones    Past Surgical History:  Procedure Laterality Date  . APPENDECTOMY  01/27/2015  . CHOLECYSTECTOMY    . COLONOSCOPY WITH ESOPHAGOGASTRODUODENOSCOPY (EGD) N/A 02/25/2014   NL TI, 2 SIMPLE ADENOMAS(1:>1 CM), LGE IH-FG POLYPS, PROMINENT AMPULLA. Needs Colonoscopy surveillance in 2018  . ESOPHAGEAL DILATION N/A 08/30/2018   Procedure: ESOPHAGEAL DILATION;  Surgeon: Rogene Houston,  MD;  [facility];  Service: Endoscopy;  Laterality: N/A;  . ESOPHAGOGASTRODUODENOSCOPY N/A 04/29/2015   Dr. Oneida Alar: 1. mild non-erosive gastritis (inflammation) was found in the gastric antrum. 2. Prominent Ampullla 39mmx 49mm. benign path with pyloric metaplasia  . ESOPHAGOGASTRODUODENOSCOPY N/A 07/12/2016   Procedure: ESOPHAGOGASTRODUODENOSCOPY (EGD);  Surgeon: Danie Binder, MD;  [facility]: [facility];  Service: Endoscopy;  Laterality: N/A;  830  . ESOPHAGOGASTRODUODENOSCOPY N/A 08/30/2018   Procedure: ESOPHAGOGASTRODUODENOSCOPY (EGD);  Surgeon: Rogene Houston, MD;  [facility];  Service: Endoscopy;  Laterality: N/A;  2:00  . EUS N/A 03/20/2014   Dr. Paulita Fujita: prominent major papilla s/p biopsy, minor papilla without clear adenomatous or mass-like appearance, chronic duodenitis on path.   Fatima Blank HERNIA REPAIR N/A 05/02/2019   Procedure: HERNIA REPAIR OPEN INCISIONAL WITH MESH;  Surgeon: Virl Cagey, MD;  Location: [facility];  Service: General;  Laterality: N/A;  . KIDNEY STONE SURGERY  age 24 and age 58   lithotripsy, stent  . SAVORY DILATION N/A 07/12/2016   Procedure: SAVORY DILATION;  Surgeon: Danie Binder, MD;  [facility];  Service: Endoscopy;  Laterality: N/A;  . WISDOM TOOTH EXTRACTION     Current Outpatient Medications on File Prior to Visit  Medication Sig Dispense Refill  . allopurinol (ZYLOPRIM) 300 MG tablet TAKE ONE TABLET BY MOUTH TWICE DAILY. 60 tablet 5  . amLODipine (NORVASC) 5 MG tablet TAKE ONE TABLET BY MOUTH AT BEDTIME. 90 tablet 0   Current Facility-Administered Medications on File Prior to Visit  Medication Dose Route Frequency Provider Last Rate Last Dose  . methylPREDNISolone acetate (DEPO-MEDROL) injection 20 mg  20 mg Intra-articular Once Mikey Kirschner, MD      . methylPREDNISolone acetate (DEPO-MEDROL) injection 20 mg  20 mg Intra-Lesional Once Mikey Kirschner, MD       Allergies  Allergen Reactions  . Phenergan [Promethazine Hcl] Other (See Comments)    arn swelled and hurt for 3 days, pt would rather not have   Family History  Problem Relation Age of Onset  . Diabetes Father   . COPD Father   . Colon cancer Neg Hx        does not know paternal side   Social History   Socioeconomic History  . Marital status: Married    Spouse name: Not on file  . Number of children: Not on file  . Years of education: Not on file  . Highest education level: Not on file  Occupational History  . Occupation: Agricultural consultant: [employer]  Social Needs  . Financial resource strain: Not on  file  . Food insecurity    Worry: Not on file    Inability: Not on file  . Transportation needs    Medical: Not on file    Non-medical: Not on file  Tobacco Use  . Smoking status: Never Smoker  . Smokeless tobacco: Never Used  Substance and Sexual Activity  . Alcohol use: No  . Drug use: No  . Sexual activity: Not on file  Lifestyle  . Physical activity    Days per week: Not on file    Minutes per session: Not on file  . Stress: Not on file  Relationships  . Social Herbalist on phone: Not on file    Gets together: Not on file    Attends religious service: Not on file    Active member of club  or organization: Not on file    Attends meetings of clubs or organizations: Not on file    Relationship status: Not on file  . Intimate partner violence    Fear of current or ex partner: Not on file    Emotionally abused: Not on file    Physically abused: Not on file    Forced sexual activity: Not on file  Other Topics Concern  . Not on file  Social History Narrative  . Not on file    Review of Systems the HPI, all other systems reviewed and are negative     Objective:   Physical Exam  BP 129/79 (BP Location: Right Arm, Patient Position: Sitting, Cuff Size: Large)   Pulse 73   Temp 97.9 F (36.6 C) (Oral)   Ht 5\' 10"  (1.778 m)   Wt 260 lb 12.8 oz (118.3 kg)   BMI 37.42 kg/m   General: 33 year old male in no acute distress Eyes: Sclera nonicteric, conjunctiva pink Mouth: Posterior pharynx moderately erythematous, no ulcers or lesions Neck: Supple Heart: Regular rate and rhythm, no murmurs Lungs: Breath sounds clear throughout Abdomen: Soft, nontender, no masses or organomegaly, umbilical hernia noted, positive bowel sounds to all 4 quadrants Rectum: Patient declined exam Extremities: No edema Neuro: Alert and orient x4, no focal deficit     Assessment & Plan:   72.  33 year old male with reflux esophagitis and dysphagia -Increase pantoprazole 40 mg 1 p.o.  twice daily to be taken 30 minutes before breakfast and dinner -EGD with possible esophageal dilatation, benefits and risk discussed including risk with sedation, risk of bleeding, perforation and infection -Avoid foods such as dry bread, steak, dry chicken and rice -Further follow up to be determined after EGD completed   2.  Rectal bleeding -CBC -I discussed scheduling a colonoscopy, patient prefers to avoid for now -Patient agreed to follow-up in the office if he continues to have rectal bleeding  3.  History of colon polyp -Recall colonoscopy due January 2023, however, I advised a colonoscopy Jan. 2021 -See Plan in # 2  4.  Fatty liver -discussed eating healthy, lose weight -CMP

## 2019-11-01 NOTE — Patient Instructions (Signed)
1. Complete the provided lab order  2. Schedule an EGD with possible esophageal dilatation   3. Increase Pantoprazole 40mg  one capsule by mouth twice daily  4. Schedule a follow up appointment in our office if you continue to have rectal bleeding.

## 2019-11-02 ENCOUNTER — Other Ambulatory Visit (INDEPENDENT_AMBULATORY_CARE_PROVIDER_SITE_OTHER): Payer: Self-pay | Admitting: *Deleted

## 2019-11-02 DIAGNOSIS — K219 Gastro-esophageal reflux disease without esophagitis: Secondary | ICD-10-CM | POA: Insufficient documentation

## 2019-11-05 ENCOUNTER — Other Ambulatory Visit (INDEPENDENT_AMBULATORY_CARE_PROVIDER_SITE_OTHER): Payer: Self-pay | Admitting: *Deleted

## 2019-11-05 DIAGNOSIS — R1310 Dysphagia, unspecified: Secondary | ICD-10-CM

## 2019-11-06 ENCOUNTER — Encounter (INDEPENDENT_AMBULATORY_CARE_PROVIDER_SITE_OTHER): Payer: Self-pay | Admitting: *Deleted

## 2019-11-06 ENCOUNTER — Other Ambulatory Visit (INDEPENDENT_AMBULATORY_CARE_PROVIDER_SITE_OTHER): Payer: Self-pay | Admitting: *Deleted

## 2019-11-06 DIAGNOSIS — R1310 Dysphagia, unspecified: Secondary | ICD-10-CM

## 2019-11-06 DIAGNOSIS — K760 Fatty (change of) liver, not elsewhere classified: Secondary | ICD-10-CM | POA: Diagnosis not present

## 2019-11-06 DIAGNOSIS — K219 Gastro-esophageal reflux disease without esophagitis: Secondary | ICD-10-CM | POA: Diagnosis not present

## 2019-11-06 DIAGNOSIS — K625 Hemorrhage of anus and rectum: Secondary | ICD-10-CM | POA: Diagnosis not present

## 2019-11-06 LAB — COMPLETE METABOLIC PANEL WITH GFR
AG Ratio: 1.6 (calc) (ref 1.0–2.5)
ALT: 69 U/L — ABNORMAL HIGH (ref 9–46)
AST: 34 U/L (ref 10–40)
Albumin: 4.5 g/dL (ref 3.6–5.1)
Alkaline phosphatase (APISO): 78 U/L (ref 36–130)
BUN: 14 mg/dL (ref 7–25)
CO2: 28 mmol/L (ref 20–32)
Calcium: 9.4 mg/dL (ref 8.6–10.3)
Chloride: 105 mmol/L (ref 98–110)
Creat: 1.11 mg/dL (ref 0.60–1.35)
GFR, Est African American: 101 mL/min/{1.73_m2} (ref >=60)
GFR, Est Non African American: 87 mL/min/{1.73_m2} (ref >=60)
Globulin: 2.8 g/dL (calc) (ref 1.9–3.7)
Glucose, Bld: 109 mg/dL (ref 65–139)
Potassium: 3.9 mmol/L (ref 3.5–5.3)
Sodium: 140 mmol/L (ref 135–146)
Total Bilirubin: 0.5 mg/dL (ref 0.2–1.2)
Total Protein: 7.3 g/dL (ref 6.1–8.1)

## 2019-11-06 LAB — CBC WITH DIFFERENTIAL/PLATELET
Absolute Monocytes: 429 cells/uL (ref 200–950)
Basophils Absolute: 58 cells/uL (ref 0–200)
Basophils Relative: 0.9 %
Eosinophils Absolute: 218 cells/uL (ref 15–500)
Eosinophils Relative: 3.4 %
HCT: 45.2 % (ref 38.5–50.0)
Hemoglobin: 15.5 g/dL (ref 13.2–17.1)
Lymphs Abs: 2413 cells/uL (ref 850–3900)
MCH: 30.5 pg (ref 27.0–33.0)
MCHC: 34.3 g/dL (ref 32.0–36.0)
MCV: 89 fL (ref 80.0–100.0)
MPV: 9.1 fL (ref 7.5–12.5)
Monocytes Relative: 6.7 %
Neutro Abs: 3283 cells/uL (ref 1500–7800)
Neutrophils Relative %: 51.3 %
Platelets: 246 10*3/uL (ref 140–400)
RBC: 5.08 10*6/uL (ref 4.20–5.80)
RDW: 12.6 % (ref 11.0–15.0)
Total Lymphocyte: 37.7 %
WBC: 6.4 10*3/uL (ref 3.8–10.8)

## 2019-11-12 ENCOUNTER — Other Ambulatory Visit (INDEPENDENT_AMBULATORY_CARE_PROVIDER_SITE_OTHER): Payer: Self-pay | Admitting: *Deleted

## 2019-11-12 DIAGNOSIS — R7401 Elevation of levels of liver transaminase levels: Secondary | ICD-10-CM

## 2019-11-15 ENCOUNTER — Telehealth: Payer: Self-pay

## 2019-11-15 NOTE — Telephone Encounter (Signed)
Spoke with patient at this time. Patient is scheduled to have Ct abdomen 11/16/2019 and no one has notified him regarding any prep. He stated his wife looked on my chart.  I let patient know he could pick up his prep and instruction sheet at Radiology today.

## 2019-11-16 ENCOUNTER — Ambulatory Visit (HOSPITAL_COMMUNITY)
Admission: RE | Admit: 2019-11-16 | Discharge: 2019-11-16 | Disposition: A | Discharge status: Home / Self Care | Payer: BC Managed Care – PPO | Source: Ambulatory Visit | Attending: General Surgery | Admitting: General Surgery

## 2019-11-16 ENCOUNTER — Other Ambulatory Visit: Payer: Self-pay

## 2019-11-16 DIAGNOSIS — K432 Incisional hernia without obstruction or gangrene: Secondary | ICD-10-CM | POA: Diagnosis not present

## 2019-11-16 MED ORDER — IOHEXOL 300 MG/ML  SOLN
100.0000 mL | Freq: Once | INTRAMUSCULAR | Status: AC | PRN
  Administered 2019-11-16: 100 mL via INTRAVENOUS

## 2019-11-19 ENCOUNTER — Telehealth: Payer: Self-pay | Admitting: General Surgery

## 2019-11-19 NOTE — Telephone Encounter (Signed)
[facility]  Ct with recurrent ventral hernia with fat. The hernia has recurred on the right lateral edge of the 8cm mesh.  Will need to proceed with laparoscopic repair with larger mesh. Discussed risk of bleeding, infection, recurrence, injury to other organs, use of mesh, and he has opted to proceed.  Plan for this year if he can work out with his schedule.  He does have an EGD scheduled for 12/28. Can work around this.  Curlene Labrum, MD [facility] [address] [place], [place] [address] [phone] (office)

## 2019-11-21 DIAGNOSIS — K432 Incisional hernia without obstruction or gangrene: Secondary | ICD-10-CM

## 2019-11-26 ENCOUNTER — Encounter (HOSPITAL_COMMUNITY): Payer: Self-pay

## 2019-11-26 ENCOUNTER — Other Ambulatory Visit: Payer: Self-pay

## 2019-11-27 ENCOUNTER — Telehealth: Payer: Self-pay | Admitting: General Surgery

## 2019-11-27 ENCOUNTER — Other Ambulatory Visit (HOSPITAL_COMMUNITY)
Admission: RE | Admit: 2019-11-27 | Discharge: 2019-11-27 | Disposition: A | Discharge status: Home / Self Care | Payer: BC Managed Care – PPO | Source: Ambulatory Visit | Attending: General Surgery | Admitting: General Surgery

## 2019-11-27 ENCOUNTER — Encounter (HOSPITAL_COMMUNITY)
Admission: RE | Admit: 2019-11-27 | Discharge: 2019-11-27 | Disposition: A | Discharge status: Home / Self Care | Payer: BC Managed Care – PPO | Source: Ambulatory Visit | Attending: General Surgery | Admitting: General Surgery

## 2019-11-27 DIAGNOSIS — Z01812 Encounter for preprocedural laboratory examination: Secondary | ICD-10-CM | POA: Insufficient documentation

## 2019-11-27 DIAGNOSIS — Z20828 Contact with and (suspected) exposure to other viral communicable diseases: Secondary | ICD-10-CM | POA: Insufficient documentation

## 2019-11-27 HISTORY — DX: Essential (primary) hypertension: I10

## 2019-11-27 LAB — SARS CORONAVIRUS 2 (TAT 6-24 HRS): SARS Coronavirus 2: NEGATIVE

## 2019-11-27 NOTE — Telephone Encounter (Signed)
[facility]  Possible bad weather tomorrow. Gave patient the option of scheduling on Thursday. He wants to go with tomorrow.  Curlene Labrum, MD [facility] [address] [place], [place] [address] [id number] [phone] (office)

## 2019-11-28 ENCOUNTER — Other Ambulatory Visit: Payer: Self-pay

## 2019-11-28 ENCOUNTER — Encounter (HOSPITAL_COMMUNITY): Payer: Self-pay | Admitting: General Surgery

## 2019-11-28 ENCOUNTER — Ambulatory Visit (HOSPITAL_COMMUNITY): Payer: BC Managed Care – PPO | Admitting: Anesthesiology

## 2019-11-28 ENCOUNTER — Encounter (HOSPITAL_COMMUNITY)
Admission: RE | Disposition: A | Discharge status: Home / Self Care | Payer: Self-pay | Source: Home / Self Care | Attending: General Surgery

## 2019-11-28 ENCOUNTER — Ambulatory Visit (HOSPITAL_COMMUNITY)
Admission: RE | Admit: 2019-11-28 | Discharge: 2019-11-28 | Disposition: A | Discharge status: Home / Self Care | Payer: BC Managed Care – PPO | Attending: General Surgery | Admitting: General Surgery

## 2019-11-28 DIAGNOSIS — Z888 Allergy status to other drugs, medicaments and biological substances status: Secondary | ICD-10-CM | POA: Insufficient documentation

## 2019-11-28 DIAGNOSIS — K430 Incisional hernia with obstruction, without gangrene: Secondary | ICD-10-CM | POA: Diagnosis not present

## 2019-11-28 DIAGNOSIS — M1990 Unspecified osteoarthritis, unspecified site: Secondary | ICD-10-CM | POA: Diagnosis not present

## 2019-11-28 DIAGNOSIS — M109 Gout, unspecified: Secondary | ICD-10-CM | POA: Diagnosis not present

## 2019-11-28 DIAGNOSIS — K432 Incisional hernia without obstruction or gangrene: Secondary | ICD-10-CM | POA: Diagnosis not present

## 2019-11-28 DIAGNOSIS — I10 Essential (primary) hypertension: Secondary | ICD-10-CM | POA: Diagnosis not present

## 2019-11-28 DIAGNOSIS — Z79899 Other long term (current) drug therapy: Secondary | ICD-10-CM | POA: Diagnosis not present

## 2019-11-28 DIAGNOSIS — K219 Gastro-esophageal reflux disease without esophagitis: Secondary | ICD-10-CM | POA: Diagnosis not present

## 2019-11-28 DIAGNOSIS — K660 Peritoneal adhesions (postprocedural) (postinfection): Secondary | ICD-10-CM | POA: Diagnosis not present

## 2019-11-28 DIAGNOSIS — K436 Other and unspecified ventral hernia with obstruction, without gangrene: Secondary | ICD-10-CM | POA: Diagnosis not present

## 2019-11-28 HISTORY — PX: VENTRAL HERNIA REPAIR: SHX424

## 2019-11-28 SURGERY — REPAIR, HERNIA, VENTRAL, LAPAROSCOPIC
Anesthesia: General

## 2019-11-28 MED ORDER — MIDAZOLAM HCL 2 MG/2ML IJ SOLN
INTRAMUSCULAR | Status: AC
  Filled 2019-11-28: qty 2

## 2019-11-28 MED ORDER — HYDROMORPHONE HCL 1 MG/ML IJ SOLN
0.2500 mg | INTRAMUSCULAR | Status: DC | PRN
  Administered 2019-11-28 (×3): 0.5 mg via INTRAVENOUS
  Filled 2019-11-28 (×3): qty 0.5

## 2019-11-28 MED ORDER — HYDROCODONE-ACETAMINOPHEN 7.5-325 MG PO TABS
1.0000 | ORAL_TABLET | Freq: Once | ORAL | Status: AC | PRN
  Administered 2019-11-28: 1 via ORAL
  Filled 2019-11-28: qty 1

## 2019-11-28 MED ORDER — ONDANSETRON HCL 4 MG/2ML IJ SOLN
INTRAMUSCULAR | Status: DC | PRN
  Administered 2019-11-28: 4 mg via INTRAVENOUS

## 2019-11-28 MED ORDER — SUCCINYLCHOLINE CHLORIDE 200 MG/10ML IV SOSY
PREFILLED_SYRINGE | INTRAVENOUS | Status: AC
  Filled 2019-11-28: qty 30

## 2019-11-28 MED ORDER — DOCUSATE SODIUM 100 MG PO CAPS: 100.0000 mg | ORAL_CAPSULE | Freq: Two times a day (BID) | ORAL | 2 refills | Status: DC

## 2019-11-28 MED ORDER — LIDOCAINE 2% (20 MG/ML) 5 ML SYRINGE
INTRAMUSCULAR | Status: AC
  Filled 2019-11-28: qty 10

## 2019-11-28 MED ORDER — MIDAZOLAM HCL 5 MG/5ML IJ SOLN
INTRAMUSCULAR | Status: DC | PRN
  Administered 2019-11-28: 2 mg via INTRAVENOUS

## 2019-11-28 MED ORDER — PROPOFOL 10 MG/ML IV BOLUS
INTRAVENOUS | Status: AC
  Filled 2019-11-28: qty 20

## 2019-11-28 MED ORDER — ROCURONIUM BROMIDE 100 MG/10ML IV SOLN
INTRAVENOUS | Status: DC | PRN
  Administered 2019-11-28: 10 mg via INTRAVENOUS
  Administered 2019-11-28: 40 mg via INTRAVENOUS
  Administered 2019-11-28: 10 mg via INTRAVENOUS

## 2019-11-28 MED ORDER — FENTANYL CITRATE (PF) 100 MCG/2ML IJ SOLN
INTRAMUSCULAR | Status: AC
  Filled 2019-11-28: qty 2

## 2019-11-28 MED ORDER — DEXAMETHASONE SODIUM PHOSPHATE 10 MG/ML IJ SOLN
INTRAMUSCULAR | Status: AC
  Filled 2019-11-28: qty 1

## 2019-11-28 MED ORDER — LACTATED RINGERS IV SOLN: INTRAVENOUS | Status: DC

## 2019-11-28 MED ORDER — CHLORHEXIDINE GLUCONATE CLOTH 2 % EX PADS: 6.0000 | MEDICATED_PAD | Freq: Once | CUTANEOUS | Status: DC

## 2019-11-28 MED ORDER — PROPOFOL 10 MG/ML IV BOLUS
INTRAVENOUS | Status: DC | PRN
  Administered 2019-11-28: 250 mg via INTRAVENOUS

## 2019-11-28 MED ORDER — SUGAMMADEX SODIUM 200 MG/2ML IV SOLN
INTRAVENOUS | Status: DC | PRN
  Administered 2019-11-28: 235.8 mg via INTRAVENOUS

## 2019-11-28 MED ORDER — LACTATED RINGERS IV SOLN: INTRAVENOUS | Status: DC | PRN

## 2019-11-28 MED ORDER — GLYCOPYRROLATE PF 0.2 MG/ML IJ SOSY
PREFILLED_SYRINGE | INTRAMUSCULAR | Status: AC
  Filled 2019-11-28: qty 3

## 2019-11-28 MED ORDER — ONDANSETRON HCL 4 MG/2ML IJ SOLN
INTRAMUSCULAR | Status: AC
  Filled 2019-11-28: qty 2

## 2019-11-28 MED ORDER — BUPIVACAINE LIPOSOME 1.3 % IJ SUSP
INTRAMUSCULAR | Status: AC
  Filled 2019-11-28: qty 20

## 2019-11-28 MED ORDER — SUGAMMADEX SODIUM 500 MG/5ML IV SOLN
INTRAVENOUS | Status: AC
  Filled 2019-11-28: qty 5

## 2019-11-28 MED ORDER — ACETAMINOPHEN 500 MG PO TABS
1000.0000 mg | ORAL_TABLET | ORAL | Status: AC
  Administered 2019-11-28: 1000 mg via ORAL
  Filled 2019-11-28: qty 2

## 2019-11-28 MED ORDER — CEFAZOLIN SODIUM-DEXTROSE 2-4 GM/100ML-% IV SOLN
2.0000 g | INTRAVENOUS | Status: AC
  Administered 2019-11-28: 2 g via INTRAVENOUS
  Filled 2019-11-28: qty 100

## 2019-11-28 MED ORDER — MIDAZOLAM HCL 2 MG/2ML IJ SOLN: 0.5000 mg | Freq: Once | INTRAMUSCULAR | Status: DC | PRN

## 2019-11-28 MED ORDER — BUPIVACAINE LIPOSOME 1.3 % IJ SUSP
INTRAMUSCULAR | Status: DC | PRN
  Administered 2019-11-28: 20 mL

## 2019-11-28 MED ORDER — OXYCODONE HCL 5 MG PO TABS: 5.0000 mg | ORAL_TABLET | ORAL | 0 refills | Status: DC | PRN

## 2019-11-28 MED ORDER — SUCCINYLCHOLINE CHLORIDE 20 MG/ML IJ SOLN
INTRAMUSCULAR | Status: DC | PRN
  Administered 2019-11-28: 180 mg via INTRAVENOUS

## 2019-11-28 MED ORDER — FENTANYL CITRATE (PF) 100 MCG/2ML IJ SOLN
INTRAMUSCULAR | Status: DC | PRN
  Administered 2019-11-28: 50 ug via INTRAVENOUS
  Administered 2019-11-28: 100 ug via INTRAVENOUS
  Administered 2019-11-28 (×4): 50 ug via INTRAVENOUS

## 2019-11-28 MED ORDER — FENTANYL CITRATE (PF) 250 MCG/5ML IJ SOLN
INTRAMUSCULAR | Status: AC
  Filled 2019-11-28: qty 5

## 2019-11-28 MED ORDER — 0.9 % SODIUM CHLORIDE (POUR BTL) OPTIME
TOPICAL | Status: DC | PRN
  Administered 2019-11-28: 11:00:00 1000 mL

## 2019-11-28 MED ORDER — DEXAMETHASONE SODIUM PHOSPHATE 4 MG/ML IJ SOLN
INTRAMUSCULAR | Status: DC | PRN
  Administered 2019-11-28: 10 mg via INTRAVENOUS

## 2019-11-28 MED ORDER — ONDANSETRON HCL 4 MG PO TABS: 4.0000 mg | ORAL_TABLET | Freq: Every day | ORAL | 1 refills | Status: DC | PRN

## 2019-11-28 SURGICAL SUPPLY — 53 items
ADH SKN CLS APL DERMABOND .7 (GAUZE/BANDAGES/DRESSINGS) ×2
APL PRP STRL LF DISP 70% ISPRP (MISCELLANEOUS) ×1
BINDER ABDOMINAL 12 ML 46-62 (SOFTGOODS) ×1 IMPLANT
BLADE SURG 15 STRL LF DISP TIS (BLADE) ×1 IMPLANT
BLADE SURG 15 STRL SS (BLADE) ×2
CHLORAPREP W/TINT 26 (MISCELLANEOUS) ×2 IMPLANT
CLOTH BEACON ORANGE TIMEOUT ST (SAFETY) ×2 IMPLANT
COVER LIGHT HANDLE STERIS (MISCELLANEOUS) ×4 IMPLANT
COVER WAND RF STERILE (DRAPES) ×2 IMPLANT
DECANTER SPIKE VIAL GLASS SM (MISCELLANEOUS) ×2 IMPLANT
DERMABOND ADVANCED (GAUZE/BANDAGES/DRESSINGS) ×2
DERMABOND ADVANCED .7 DNX12 (GAUZE/BANDAGES/DRESSINGS) ×1 IMPLANT
DEVICE TROCAR PUNCTURE CLOSURE (ENDOMECHANICALS) ×2 IMPLANT
ELECT REM PT RETURN 9FT ADLT (ELECTROSURGICAL) ×2
ELECTRODE REM PT RTRN 9FT ADLT (ELECTROSURGICAL) ×1 IMPLANT
GLOVE BIO SURGEON STRL SZ 6.5 (GLOVE) ×2 IMPLANT
GLOVE BIOGEL PI IND STRL 6.5 (GLOVE) ×1 IMPLANT
GLOVE BIOGEL PI IND STRL 7.0 (GLOVE) ×2 IMPLANT
GLOVE BIOGEL PI INDICATOR 6.5 (GLOVE) ×1
GLOVE BIOGEL PI INDICATOR 7.0 (GLOVE) ×3
GLOVE ECLIPSE 7.0 STRL STRAW (GLOVE) ×2 IMPLANT
GLOVE SURG SS PI 7.5 STRL IVOR (GLOVE) ×4 IMPLANT
GOWN STRL REUS W/TWL LRG LVL3 (GOWN DISPOSABLE) ×6 IMPLANT
INST SET LAPROSCOPIC AP (KITS) ×2 IMPLANT
IV NS IRRIG 3000ML ARTHROMATIC (IV SOLUTION) IMPLANT
KIT TURNOVER KIT A (KITS) ×2 IMPLANT
LIGASURE LAP ATLAS 10MM 37CM (INSTRUMENTS) ×2 IMPLANT
MANIFOLD NEPTUNE II (INSTRUMENTS) ×2 IMPLANT
MESH VENTRALIGHT ST 6X8 (Mesh Specialty) ×2 IMPLANT
MESH VENTRLGHT ELLIPSE 8X6XMFL (Mesh Specialty) IMPLANT
NDL INSUFFLATION 14GA 120MM (NEEDLE) ×1 IMPLANT
NEEDLE HYPO 22GX1.5 SAFETY (NEEDLE) ×2 IMPLANT
NEEDLE INSUFFLATION 14GA 120MM (NEEDLE) ×2 IMPLANT
NS IRRIG 1000ML POUR BTL (IV SOLUTION) ×2 IMPLANT
PACK LAP CHOLE LZT030E (CUSTOM PROCEDURE TRAY) ×2 IMPLANT
PAD ARMBOARD 7.5X6 YLW CONV (MISCELLANEOUS) ×2 IMPLANT
PENCIL HANDSWITCHING (ELECTRODE) ×2 IMPLANT
SET BASIN LINEN APH (SET/KITS/TRAYS/PACK) ×2 IMPLANT
SET TUBE IRRIG SUCTION NO TIP (IRRIGATION / IRRIGATOR) IMPLANT
SET TUBE SMOKE EVAC HIGH FLOW (TUBING) ×2 IMPLANT
SUT MNCRL AB 4-0 PS2 18 (SUTURE) ×4 IMPLANT
SUT NOVA NAB GS-22 2 2-0 T-19 (SUTURE) IMPLANT
SUT PROLENE 0 CT 1 CR/8 (SUTURE) ×1 IMPLANT
SUT PROLENE 2 0 CR (SUTURE) ×2 IMPLANT
SUT VICRYL 0 UR6 27IN ABS (SUTURE) ×4 IMPLANT
SYR 20ML LL LF (SYRINGE) ×2 IMPLANT
TACKER 5MM HERNIA 3.5CML NAB (ENDOMECHANICALS) ×2 IMPLANT
TRAY FOLEY MTR SLVR 16FR STAT (SET/KITS/TRAYS/PACK) ×2 IMPLANT
TROCAR ENDO BLADELESS 11MM (ENDOMECHANICALS) ×2 IMPLANT
TROCAR XCEL NON-BLD 5MMX100MML (ENDOMECHANICALS) ×2 IMPLANT
TROCAR XCEL UNIV SLVE 11M 100M (ENDOMECHANICALS) ×4 IMPLANT
WARMER LAPAROSCOPE (MISCELLANEOUS) ×2 IMPLANT
YANKAUER SUCT BULB TIP 10FT TU (MISCELLANEOUS) ×2 IMPLANT

## 2019-11-28 NOTE — H&P (Signed)
[facility] History and Physical   [name] is a 33 y.o. male.  HPI: Mr. [name] is a 33 yo known to me with a history of incisional hernia repair with mesh in the spring 2020. He is active and has to lift for work and also has three children. He noticed a bulge and had some pain in the area after being active at the beach. He saw me in clinic and we were concern for a hernia recurrence, and a CT scan was obtained that demonstrated this recurrence.  He continues to have the bulge and pain associated with the bulge. This pain is sharp in nature at times.  He has been having regular Bms and no associated nausea or vomiting.   Past Medical History:  Diagnosis Date  . Arthritis   . Elevated LFTs    mildly elevated transaminases in past, now normalized   . GERD (gastroesophageal reflux disease)   . Gout   . History of kidney stones   . Hypertension     Past Surgical History:  Procedure Laterality Date  . APPENDECTOMY  01/27/2015  . CHOLECYSTECTOMY    . COLONOSCOPY WITH ESOPHAGOGASTRODUODENOSCOPY (EGD) N/A 02/25/2014   NL TI, 2 SIMPLE ADENOMAS(1:>1 CM), LGE IH-FG POLYPS, PROMINENT AMPULLA. Needs Colonoscopy surveillance in 2018  . ESOPHAGEAL DILATION N/A 08/30/2018   Procedure: ESOPHAGEAL DILATION;  Surgeon: Rogene Houston, Thomas;  [facility];  Service: Endoscopy;  Laterality: N/A;  . ESOPHAGOGASTRODUODENOSCOPY N/A 04/29/2015   Dr. Oneida Alar: 1. mild non-erosive gastritis (inflammation) was found in the gastric antrum. 2. Prominent Ampullla 31mmx 31mm. benign path with pyloric metaplasia  . ESOPHAGOGASTRODUODENOSCOPY N/A 07/12/2016   Procedure: ESOPHAGOGASTRODUODENOSCOPY (EGD);  Surgeon: Danie Binder, Thomas;  [facility];  Service: Endoscopy;  Laterality: N/A;  830  . ESOPHAGOGASTRODUODENOSCOPY N/A 08/30/2018   Procedure: ESOPHAGOGASTRODUODENOSCOPY (EGD);  Surgeon: Rogene Houston, Thomas;  [facility];  Service: Endoscopy;  Laterality: N/A;   2:00  . EUS N/A 03/20/2014   Dr. Paulita Fujita: prominent major papilla s/p biopsy, minor papilla without clear adenomatous or mass-like appearance, chronic duodenitis on path.   Fatima Blank HERNIA REPAIR N/A 05/02/2019   Procedure: HERNIA REPAIR OPEN INCISIONAL WITH MESH;  Surgeon: Virl Cagey, Thomas;  Location: [facility];  Service: General;  Laterality: N/A;  . KIDNEY STONE SURGERY  age 71 and age 53   lithotripsy, stent  . SAVORY DILATION N/A 07/12/2016   Procedure: SAVORY DILATION;  Surgeon: Danie Binder, Thomas;  [facility];  Service: Endoscopy;  Laterality: N/A;  . WISDOM TOOTH EXTRACTION      Family History  Problem Relation Age of Onset  . Diabetes Father   . COPD Father   . Colon cancer Neg Hx        does not know paternal side    Social History   Tobacco Use  . Smoking status: Never Smoker  . Smokeless tobacco: Never Used  Substance Use Topics  . Alcohol use: No  . Drug use: No    Medications: I have reviewed the patient's current medications. Facility-Administered Medications Prior to Admission  Medication Dose Route Frequency Provider Last Rate Last Admin  . methylPREDNISolone acetate (DEPO-MEDROL) injection 20 mg  20 mg Intra-articular Once [name]      . methylPREDNISolone acetate (DEPO-MEDROL) injection 20 mg  20 mg Intra-Lesional Once [name]       Medications Prior to Admission  Medication Sig  Dispense Refill Last Dose  . allopurinol (ZYLOPRIM) 300 MG tablet TAKE ONE TABLET BY MOUTH TWICE DAILY. (Patient taking differently: Take 300 mg by mouth 2 (two) times daily. ) 60 tablet 5 11/27/2019 at Unknown time  . amLODipine (NORVASC) 5 MG tablet TAKE ONE TABLET BY MOUTH AT BEDTIME. (Patient taking differently: Take 5 mg by mouth at bedtime. Take one tablet by mouth at bedtime) 90 tablet 0 11/27/2019 at Unknown time  . pantoprazole (PROTONIX) 40 MG tablet Take 1 tablet (40 mg total) by mouth 2 (two) times daily. 60 tablet 1 11/27/2019  at Unknown time   Allergies  Allergen Reactions  . Phenergan [Promethazine Hcl] Other (See Comments)    arn swelled and hurt for 3 days, pt would rather not have    ROS:  A comprehensive review of systems was negative except for: Gastrointestinal: positive for abdominal pain and bulge at umbilicus  Blood pressure 128/77, pulse 68, temperature 98 F (36.7 C), temperature source Oral, resp. rate 16, SpO2 98 %. Physical Exam Vitals reviewed.  Constitutional:      Appearance: Normal appearance.  HENT:     Head: Normocephalic and atraumatic.     Nose: Nose normal.     Mouth/Throat:     Mouth: Mucous membranes are moist.  Eyes:     Pupils: Pupils are equal, round, and reactive to light.  Cardiovascular:     Rate and Rhythm: Normal rate and regular rhythm.  Pulmonary:     Effort: Pulmonary effort is normal.     Breath sounds: Normal breath sounds.  Abdominal:     General: There is no distension.     Palpations: Abdomen is soft.     Tenderness: There is no abdominal tenderness.     Hernia: A hernia is present.     Comments: Right of umbilicus, bulge with tenderness  Musculoskeletal:        General: No swelling. Normal range of motion.     Cervical back: Normal range of motion. No rigidity.  Skin:    General: Skin is warm and dry.  Neurological:     General: No focal deficit present.     Mental Status: He is alert and oriented to person, place, and time.  Psychiatric:        Mood and Affect: Mood normal.        Behavior: Behavior normal.        Thought Content: Thought content normal.        Judgment: Judgment normal.     Results: Results for orders placed or performed during the hospital encounter of 11/27/19 (from the past 48 hour(s))  SARS CORONAVIRUS 2 (TAT 6-24 HRS) Nasopharyngeal Nasopharyngeal Swab     Status: None   Collection Time: 11/27/19  7:06 AM   Specimen: Nasopharyngeal Swab  Result Value Ref Range   SARS Coronavirus 2 NEGATIVE NEGATIVE    Comment:  (NOTE) SARS-CoV-2 target nucleic acids are NOT DETECTED. The SARS-CoV-2 RNA is generally detectable in upper and lower respiratory specimens during the acute phase of infection. Negative results do not preclude SARS-CoV-2 infection, do not rule out co-infections with other pathogens, and should not be used as the sole basis for treatment or other patient management decisions. Negative results must be combined with clinical observations, patient history, and epidemiological information. The expected result is Negative. Fact Sheet for Patients: SugarRoll.be Fact Sheet for Healthcare Providers: https://www.woods-mathews.com/ This test is not yet approved or cleared by the [place] FDA and  has  been authorized for detection and/or diagnosis of SARS-CoV-2 by FDA under an Emergency Use Authorization (EUA). This EUA will remain  in effect (meaning this test can be used) for the duration of the COVID-19 declaration under Section 56 4(b)(1) of the Act, 21 U.S.C. section 360bbb-3(b)(1), unless the authorization is terminated or revoked sooner. Performed at [facility], [place] [address], [place], [place] [address]     CT a/p- personally reviewed  Right of the prior mesh, recurrence with fatty tissue   Assessment & Plan:  JATINDER BUFFKIN is a 33 y.o. male with a recurrent incisional hernia after repair. We have discussed the option of laparoscopic repair to place a larger mesh.  We discussed the risk of bleeding, infection, use of mesh, recurrence, possibility of injury to the bowel and need for an open procedure or additional procedures. He has opted to proceed.   -Laparoscopic ventral hernia repair with mesh for recurrent hernia   All questions were answered to the satisfaction of the patient.    Virl Cagey 11/28/2019, 10:08 AM

## 2019-11-28 NOTE — Anesthesia Preprocedure Evaluation (Signed)
Anesthesia Evaluation  Patient identified by MRN, date of birth, ID band Patient awake  General Assessment Comment:States told last time only- he was difficult to sedate Has had many operations without issues   Reviewed: Allergy & Precautions, NPO status , Patient's Chart, lab work & pertinent test results  Airway Mallampati: II  TM Distance: >3 FB Neck ROM: Full    Dental no notable dental hx. (+) Teeth Intact   Pulmonary neg pulmonary ROS,    Pulmonary exam normal breath sounds clear to auscultation       Cardiovascular Exercise Tolerance: Good hypertension, Pt. on medications negative cardio ROS Normal cardiovascular examI Rhythm:Regular Rate:Normal     Neuro/Psych negative neurological ROS  negative psych ROS   GI/Hepatic Neg liver ROS, GERD  Medicated and Controlled,  Endo/Other  negative endocrine ROS  Renal/GU negative Renal ROS  negative genitourinary   Musculoskeletal  (+) Arthritis , Osteoarthritis,    Abdominal   Peds negative pediatric ROS (+)  Hematology negative hematology ROS (+)   Anesthesia Other Findings   Reproductive/Obstetrics negative OB ROS                             Anesthesia Physical Anesthesia Plan  ASA: II  Anesthesia Plan: General   Post-op Pain Management:    Induction: Intravenous  PONV Risk Score and Plan: 2 and Ondansetron, Dexamethasone and Treatment may vary due to age or medical condition  Airway Management Planned: Oral ETT  Additional Equipment:   Intra-op Plan:   Post-operative Plan: Extubation in OR  Informed Consent: I have reviewed the patients History and Physical, chart, labs and discussed the procedure including the risks, benefits and alternatives for the proposed anesthesia with the patient or authorized representative who has indicated his/her understanding and acceptance.     Dental advisory given  Plan Discussed with:  CRNA  Anesthesia Plan Comments: (Plan Full PPE use Plan GETA D/W PT -WTP with same after Q&A)        Anesthesia Quick Evaluation

## 2019-11-28 NOTE — Anesthesia Procedure Notes (Addendum)
Procedure Name: Intubation Date/Time: 11/28/2019 10:37 AM Performed by: Andree Elk, Amy A, CRNA Pre-anesthesia Checklist: Patient identified, Patient being monitored, Timeout performed, Emergency Drugs available and Suction available Patient Re-evaluated:Patient Re-evaluated prior to induction Oxygen Delivery Method: Circle system utilized Preoxygenation: Pre-oxygenation with 100% oxygen Induction Type: IV induction, Rapid sequence and Cricoid Pressure applied Laryngoscope Size: 3 and Glidescope Grade View: Grade I Tube type: Oral Tube size: 7.5 mm Number of attempts: 1 Airway Equipment and Method: Stylet and Video-laryngoscopy Placement Confirmation: ETT inserted through vocal cords under direct vision,  positive ETCO2 and breath sounds checked- equal and bilateral Secured at: 21 cm Tube secured with: Tape Dental Injury: Teeth and Oropharynx as per pre-operative assessment

## 2019-11-28 NOTE — Discharge Instructions (Signed)
Discharge Instructions:  Common Complaints: Pain at the incision sites is common. This will improve with time. Take your pain medications as described below. Some nausea is common and poor appetite. The main goal is to stay hydrated the first few days after surgery.   Diet/ Activity: Diet as tolerated.  Shower per your regular routine daily.  Do not take hot showers. Take warm showers that are less than 10 minutes.  Pat the incision dry. Do not pick at the glue.  Wear an abdominal binder daily with activity. You do not have to wear this while sleeping or sitting.  Rest and listen to your body, but do not remain in bed all day.  Walk everyday for at least 15-20 minutes. Deep cough and move around every 1-2 hours in the first few days after surgery.  Do not lift > 10 lbs, perform excessive bending, pushing, pulling, squatting for 6-8 weeks after surgery.  The activity restrictions and the abdominal binder are to prevent hernia formation at your incision while you are healing.  Do not place lotions or balms on your incision unless instructed to specifically by Dr. Constance Haw.   Medication: Take tylenol and ibuprofen as needed for pain control, alternating every 4-6 hours.  Example:  Tylenol 1000mg  @ 6am, 12noon, 6pm, 70midnight (Do not exceed 4000mg  of tylenol a day). Ibuprofen 800mg  @ 9am, 3pm, 9pm, 3am (Do not exceed 3600mg  of ibuprofen a day).  Take Roxicodone for breakthrough pain every 4 hours.  Take Colace for constipation related to narcotic pain medication. If you do not have a bowel movement in 2 days, take Miralax over the counter.  Drink plenty of water to also prevent constipation.   Contact Information: If you have questions or concerns, please call our office, [phone], Monday- Thursday 8AM-5PM and Friday 8AM-12Noon.  If it is after hours or on the weekend, please call [facility] Main Number, [phone], and ask to speak to the surgeon on call for Dr. Constance Haw at [facility].     Laparoscopic Ventral Hernia Repair, Care After This sheet gives you information about how to care for yourself after your procedure. Your health care provider may also give you more specific instructions. If you have problems or questions, contact your health care provider. What can I expect after the procedure? After the procedure, it is common to have:  Pain, discomfort, or soreness. Follow these instructions at home: Incision care   Follow instructions from your health care provider about how to take care of your incision. Make sure you: ? Wash your hands with soap and water before you change your bandage (dressing) or before you touch your abdomen. If soap and water are not available, use hand sanitizer. ? Change your dressing as told by your health care provider. ? Leave stitches (sutures), skin glue, or adhesive strips in place. These skin closures may need to stay in place for 2 weeks or longer. If adhesive strip edges start to loosen and curl up, you may trim the loose edges. Do not remove adhesive strips completely unless your health care provider tells you to do that.  Check your incision area every day for signs of infection. Check for: ? Redness, swelling, or pain. ? Fluid or blood. ? Warmth. ? Pus or a bad smell. Bathing   Do not take baths, swim, or use a hot tub until your health care provider approves.   You may shower.  Keep your bandage (dressing) dry until your health care provider says it can  be removed. Activity  Do not lift anything that is heavier than 10 lb (4.5 kg) until your health care provider approves.  Do not drive or use heavy machinery while taking prescription pain medicine. Ask your health care provider when it is safe for you to drive or use heavy machinery.  Do not drive for 24 hours if you were given a medicine to help you relax (sedative) during your procedure.  Rest as told by your health care provider. You may return to your normal  activities when your health care provider approves. General instructions  Take over-the-counter and prescription medicines only as told by your health care provider.  To prevent or treat constipation while you are taking prescription pain medicine, your health care provider may recommend that you: ? Take over-the-counter or prescription medicines. ? Eat foods that are high in fiber, such as fresh fruits and vegetables, whole grains, and beans. ? Limit foods that are high in fat and processed sugars, such as fried and sweet foods.  Drink enough fluid to keep your urine clear or pale yellow.  Hold a pillow over your abdomen when you cough or sneeze. This helps with pain.  Keep all follow-up visits as told by your health care provider. This is important. Contact a health care provider if:  You have: ? A fever or chills. ? Redness, swelling, or pain around your incision. ? Fluid or blood coming from your incision. ? Pus or a bad smell coming from your incision. ? Pain that gets worse or does not get better with medicine. ? Nausea or vomiting. ? A cough. ? Shortness of breath.  Your incision feels warm to the touch.  You have not had a bowel movement in three days.  You are not able to urinate. Get help right away if:  You have severe pain in your abdomen.  You have persistent nausea and vomiting.  You have redness, warmth, or pain in your leg.  You have chest pain.  You have trouble breathing. Summary  After this procedure, it is common to have pain, discomfort, or soreness.  Follow instructions from your health care provider about how to take care of your incision.  Check your incision area every day for signs of infection. Report any signs of infection to your health care provider.  Keep all follow-up visits as told by your health care provider. This is important. This information is not intended to replace advice given to you by your health care provider. Make sure  you discuss any questions you have with your health care provider. Document Released: 11/15/2012 Document Revised: 11/11/2017 Document Reviewed: 07/21/2016 Elsevier Patient Education  2020 [place] Anesthesia, Adult, Care After This sheet gives you information about how to care for yourself after your procedure. Your health care provider may also give you more specific instructions. If you have problems or questions, contact your health care provider. What can I expect after the procedure? After the procedure, the following side effects are common: Pain or discomfort at the IV site. Nausea. Vomiting. Sore throat. Trouble concentrating. Feeling cold or chills. Weak or tired. Sleepiness and fatigue. Soreness and body aches. These side effects can affect parts of the body that were not involved in surgery. Follow these instructions at home:  For at least 24 hours after the procedure: Have a responsible adult stay with you. It is important to have someone help care for you until you are awake and alert. Rest  as needed. Do not: Participate in activities in which you could fall or become injured. Drive. Use heavy machinery. Drink alcohol. Take sleeping pills or medicines that cause drowsiness. Make important decisions or sign legal documents. Take care of children on your own. Eating and drinking Follow any instructions from your health care provider about eating or drinking restrictions. When you feel hungry, start by eating small amounts of foods that are soft and easy to digest (bland), such as toast. Gradually return to your regular diet. Drink enough fluid to keep your urine pale yellow. If you vomit, rehydrate by drinking water, juice, or clear broth. General instructions If you have sleep apnea, surgery and certain medicines can increase your risk for breathing problems. Follow instructions from your health care provider about wearing your sleep  device: Anytime you are sleeping, including during daytime naps. While taking prescription pain medicines, sleeping medicines, or medicines that make you drowsy. Return to your normal activities as told by your health care provider. Ask your health care provider what activities are safe for you. Take over-the-counter and prescription medicines only as told by your health care provider. If you smoke, do not smoke without supervision. Keep all follow-up visits as told by your health care provider. This is important. Contact a health care provider if: You have nausea or vomiting that does not get better with medicine. You cannot eat or drink without vomiting. You have pain that does not get better with medicine. You are unable to pass urine. You develop a skin rash. You have a fever. You have redness around your IV site that gets worse. Get help right away if: You have difficulty breathing. You have chest pain. You have blood in your urine or stool, or you vomit blood. Summary After the procedure, it is common to have a sore throat or nausea. It is also common to feel tired. Have a responsible adult stay with you for the first 24 hours after general anesthesia. It is important to have someone help care for you until you are awake and alert. When you feel hungry, start by eating small amounts of foods that are soft and easy to digest (bland), such as toast. Gradually return to your regular diet. Drink enough fluid to keep your urine pale yellow. Return to your normal activities as told by your health care provider. Ask your health care provider what activities are safe for you. This information is not intended to replace advice given to you by your health care provider. Make sure you discuss any questions you have with your health care provider. Document Released: 03/07/2001 Document Revised: 12/02/2017 Document Reviewed: 07/15/2017 Elsevier Patient Education  2020 [employer].

## 2019-11-28 NOTE — Progress Notes (Signed)
[facility]  Spoke with wife, surgery completed. Patient did well. Lifting restrictions. Rx sent to pharmacy.   Curlene Labrum, MD [facility] [address] [place], [place] [address] [phone] (office)

## 2019-11-28 NOTE — Transfer of Care (Signed)
Immediate Anesthesia Transfer of Care Note  Patient: KALINO BOHNER  Procedure(s) Performed: LAPAROSCOPIC VENTRAL HERNIA WITH MESH (N/A )  Patient Location: PACU  Anesthesia Type:General  Level of Consciousness: drowsy and patient cooperative  Airway & Oxygen Therapy: Patient Spontanous Breathing and Patient connected to face mask oxygen  Post-op Assessment: Report given to RN and Post -op Vital signs reviewed and stable  Post vital signs: Reviewed and stable  Last Vitals:  Vitals Value Taken Time  BP 144/99 11/28/19 1215  Temp 37.2 C 11/28/19 1215  Pulse 92 11/28/19 1227  Resp 24 11/28/19 1227  SpO2 100 % 11/28/19 1227  Vitals shown include unvalidated device data.  Last Pain:  Vitals:   11/28/19 1215  TempSrc:   PainSc: Asleep      Patients Stated Pain Goal: 8 ([id number])  Complications: No apparent anesthesia complications

## 2019-11-28 NOTE — Anesthesia Postprocedure Evaluation (Signed)
Anesthesia Post Note Late entry for 1305  Patient: [name]  Procedure(s) Performed: LAPAROSCOPIC VENTRAL HERNIA WITH MESH (N/A )  Patient location during evaluation: PACU Anesthesia Type: General Level of consciousness: awake and alert, oriented and patient cooperative Pain management: pain level controlled Vital Signs Assessment: post-procedure vital signs reviewed and stable Respiratory status: spontaneous breathing Cardiovascular status: stable Postop Assessment: no apparent nausea or vomiting Anesthetic complications: no     Last Vitals:  Vitals:   11/28/19 1245 11/28/19 1300  BP: 137/87 (!) 141/89  Pulse: 82 (!) 103  Resp: 17 17  Temp:    SpO2: 100% 93%    Last Pain:  Vitals:   11/28/19 1300  TempSrc:   PainSc: 9                  Verdean Murin A

## 2019-11-28 NOTE — Op Note (Addendum)
[facility] Operative Note  11/28/19  Preoperative Diagnosis: Recurrent ventral hernia    Postoperative Diagnosis: Recurrent ventral hernia with incarcerated omentum    Procedure(s) Performed: Laparoscopic ventral hernia repair with Ventralight ST Mesh (15.2X20.3cm)    Surgeon: Lanell Matar. Constance Haw, MD   Assistants: No qualified resident was available    Anesthesia: General endotracheal   Anesthesiologist: Lenice Llamas, MD    Specimens:  None    Estimated Blood Loss: Minimal   Blood Replacement: None    Complications: None   Wound Class: Clean    Operative Indications: Mr. Belt is a 33 yo who I did an open incisional hernia repair on earlier this year, and has has had a recurrence of his hernia on the right lateral margin of the mesh with omentum inside the hernia.  He is having pain and a bulge, and wants to proceed with getting this repaired. We discussed the options, and discussed a laparoscopic repair with a larger mesh. We discussed the risk of bleeding, infection, recurrence, use of mesh, injury to bowel, and need for an open or additional procedures.  He opted to proceed.   Findings: Prior mesh patch in place, lateral right edge with defect and incarcerated omentum adherent to the hernia sac and mesh    Procedure: The patient was taken to the operating room and placed supine. General endotracheal anesthesia was induced. Intravenous antibiotics were administered per protocol.  An orogastric tube positioned to decompress the stomach. The abdomen was prepared and draped in the usual sterile fashion.   A veress needle was inserted in there left upper quadrant two finger breadths below the costal margin in the mid clavicular line.  The saline drop test, and low insufflation pressures confirmed entry into the abdomen.  A 11 mm trocar was placed in the left upper quadrant lateral and just inferior to the veress site.  The abdomen was entered under direct  visualization with the optiview trocar, and there was no evidence of injury from the veress or trocar.    There was omentum adherent to the mesh patch from prior. An additional 11 mm trocar was placed in the left lower quadrant.  Using sharp dissection and cautery with the scissors, I started to take down the omental adhesions as there was no bowel incorporated. Due to some larger vessels in the omentum, I opted for a Ligasure device to continue to clear the omental adhesions.  Once these were cleared the right lateral defect was noted.  The mesh patch was in place but the hernia had extended laterally.  An additional 5 mm trocar was placed in the right upper quadrant for my assistant to help.  Measuring the total size of the prior mesh and defect, this was about 5cm in diameter. I opted to use the 15.2X20.3cm Ventralight ST mesh with the long axis vertical to cover the defect and hopefully cover any future defects in the midline.  This allowed for well over 5cm laterally, and I cheated a bit to the right side when positioning the mesh.  I marked the mesh to place it coated side down, and placed four 2-0 Prolene sutures in the 12 o'clock, 3, o'clock, 6 o'clock, and 9 o'clock positions. This was rolled up, and placed through the upper right trocar with maryland graspers.     Once inside the abdominal cavity, the mesh was unrolled, coated side down, and 4 stab incisions were made on the abdomen, and a suture passer was used to grab  the 2-0 prolene sutures and secure the mesh.  Once the mesh was secured in all 4 spots, a 69mm Protack was used and two rows were placed around the circumference of the mesh. The mesh laid flat on the abdominal wall.  There was no signs of bleeding or injury.   The lower right trocar was removed, and this was closed with a suture passer and a 0 vicryl.  The 5 mm trocar was removed, and the abdomen was desufflated and pneumoperitoneum was released. The right upper trocar smaller than my  finger and < a finger breath from the rib cage, so it was not closed.   Final inspection revealed acceptable hemostasis. All counts were correct at the end of the case. The patient was awakened from anesthesia and extubated without complication.  The patient went to the PACU in stable condition.   Curlene Labrum, MD [facility] [address] [place], [place] [address] [phone] (office)

## 2019-12-05 ENCOUNTER — Other Ambulatory Visit: Payer: Self-pay

## 2019-12-05 ENCOUNTER — Encounter (HOSPITAL_COMMUNITY)
Admission: RE | Admit: 2019-12-05 | Discharge: 2019-12-05 | Disposition: A | Discharge status: Home / Self Care | Payer: BC Managed Care – PPO | Source: Ambulatory Visit | Attending: Internal Medicine | Admitting: Internal Medicine

## 2019-12-05 ENCOUNTER — Encounter (HOSPITAL_COMMUNITY): Payer: Self-pay

## 2019-12-08 ENCOUNTER — Other Ambulatory Visit: Payer: Self-pay

## 2019-12-08 ENCOUNTER — Other Ambulatory Visit (HOSPITAL_COMMUNITY)
Admission: RE | Admit: 2019-12-08 | Discharge: 2019-12-08 | Disposition: A | Discharge status: Home / Self Care | Payer: BC Managed Care – PPO | Source: Ambulatory Visit | Attending: Internal Medicine | Admitting: Internal Medicine

## 2019-12-08 DIAGNOSIS — Z20828 Contact with and (suspected) exposure to other viral communicable diseases: Secondary | ICD-10-CM | POA: Insufficient documentation

## 2019-12-08 DIAGNOSIS — Z01812 Encounter for preprocedural laboratory examination: Secondary | ICD-10-CM | POA: Insufficient documentation

## 2019-12-08 LAB — SARS CORONAVIRUS 2 (TAT 6-24 HRS): SARS Coronavirus 2: NEGATIVE

## 2019-12-10 ENCOUNTER — Encounter (HOSPITAL_COMMUNITY): Payer: Self-pay | Admitting: Internal Medicine

## 2019-12-10 ENCOUNTER — Encounter (HOSPITAL_COMMUNITY)
Admission: RE | Disposition: A | Discharge status: Home / Self Care | Payer: Self-pay | Source: Home / Self Care | Attending: Internal Medicine

## 2019-12-10 ENCOUNTER — Ambulatory Visit (HOSPITAL_COMMUNITY): Payer: BC Managed Care – PPO | Admitting: Anesthesiology

## 2019-12-10 ENCOUNTER — Ambulatory Visit (HOSPITAL_COMMUNITY)
Admission: RE | Admit: 2019-12-10 | Discharge: 2019-12-10 | Disposition: A | Discharge status: Home / Self Care | Payer: BC Managed Care – PPO | Attending: Internal Medicine | Admitting: Internal Medicine

## 2019-12-10 ENCOUNTER — Other Ambulatory Visit: Payer: Self-pay

## 2019-12-10 DIAGNOSIS — M1990 Unspecified osteoarthritis, unspecified site: Secondary | ICD-10-CM | POA: Insufficient documentation

## 2019-12-10 DIAGNOSIS — K319 Disease of stomach and duodenum, unspecified: Secondary | ICD-10-CM | POA: Diagnosis not present

## 2019-12-10 DIAGNOSIS — Z888 Allergy status to other drugs, medicaments and biological substances status: Secondary | ICD-10-CM | POA: Diagnosis not present

## 2019-12-10 DIAGNOSIS — R1314 Dysphagia, pharyngoesophageal phase: Secondary | ICD-10-CM | POA: Insufficient documentation

## 2019-12-10 DIAGNOSIS — Z79899 Other long term (current) drug therapy: Secondary | ICD-10-CM | POA: Insufficient documentation

## 2019-12-10 DIAGNOSIS — R1310 Dysphagia, unspecified: Secondary | ICD-10-CM

## 2019-12-10 DIAGNOSIS — K449 Diaphragmatic hernia without obstruction or gangrene: Secondary | ICD-10-CM | POA: Insufficient documentation

## 2019-12-10 DIAGNOSIS — K2100 Gastro-esophageal reflux disease with esophagitis, without bleeding: Secondary | ICD-10-CM | POA: Diagnosis not present

## 2019-12-10 DIAGNOSIS — M109 Gout, unspecified: Secondary | ICD-10-CM | POA: Diagnosis not present

## 2019-12-10 DIAGNOSIS — K228 Other specified diseases of esophagus: Secondary | ICD-10-CM | POA: Diagnosis not present

## 2019-12-10 DIAGNOSIS — I10 Essential (primary) hypertension: Secondary | ICD-10-CM | POA: Diagnosis not present

## 2019-12-10 DIAGNOSIS — K317 Polyp of stomach and duodenum: Secondary | ICD-10-CM | POA: Insufficient documentation

## 2019-12-10 DIAGNOSIS — K3189 Other diseases of stomach and duodenum: Secondary | ICD-10-CM | POA: Diagnosis not present

## 2019-12-10 DIAGNOSIS — K219 Gastro-esophageal reflux disease without esophagitis: Secondary | ICD-10-CM | POA: Diagnosis not present

## 2019-12-10 HISTORY — PX: ESOPHAGEAL DILATION: SHX303

## 2019-12-10 HISTORY — PX: BIOPSY: SHX5522

## 2019-12-10 HISTORY — PX: ESOPHAGOGASTRODUODENOSCOPY (EGD) WITH PROPOFOL: SHX5813

## 2019-12-10 HISTORY — PX: POLYPECTOMY: SHX5525

## 2019-12-10 SURGERY — ESOPHAGOGASTRODUODENOSCOPY (EGD) WITH PROPOFOL
Anesthesia: General

## 2019-12-10 MED ORDER — KETAMINE HCL 10 MG/ML IJ SOLN
INTRAMUSCULAR | Status: DC | PRN
  Administered 2019-12-10: 30 mg via INTRAVENOUS

## 2019-12-10 MED ORDER — LACTATED RINGERS IV SOLN: INTRAVENOUS | Status: DC | PRN

## 2019-12-10 MED ORDER — LIDOCAINE HCL (CARDIAC) PF 100 MG/5ML IV SOSY
PREFILLED_SYRINGE | INTRAVENOUS | Status: DC | PRN
  Administered 2019-12-10: 80 mg via INTRAVENOUS

## 2019-12-10 MED ORDER — GLYCOPYRROLATE 0.2 MG/ML IJ SOLN
INTRAMUSCULAR | Status: DC | PRN
  Administered 2019-12-10: .2 mg via INTRAVENOUS

## 2019-12-10 MED ORDER — PROPOFOL 500 MG/50ML IV EMUL
INTRAVENOUS | Status: DC | PRN
  Administered 2019-12-10: 175 ug/kg/min via INTRAVENOUS
  Administered 2019-12-10: 150 ug/kg/min via INTRAVENOUS

## 2019-12-10 MED ORDER — LACTATED RINGERS IV SOLN
Freq: Once | INTRAVENOUS | Status: AC
  Administered 2019-12-10: 1000 mL via INTRAVENOUS

## 2019-12-10 MED ORDER — PROPOFOL 10 MG/ML IV BOLUS
INTRAVENOUS | Status: DC | PRN
  Administered 2019-12-10: 20 mg via INTRAVENOUS

## 2019-12-10 MED ORDER — KETAMINE HCL 50 MG/5ML IJ SOSY
PREFILLED_SYRINGE | INTRAMUSCULAR | Status: AC
  Filled 2019-12-10: qty 5

## 2019-12-10 MED ORDER — CHLORHEXIDINE GLUCONATE CLOTH 2 % EX PADS: 6.0000 | MEDICATED_PAD | Freq: Once | CUTANEOUS | Status: DC

## 2019-12-10 NOTE — H&P (Signed)
[name] is an 33 y.o. male.   Chief Complaint: Patient is here for esophagogastroduodenoscopy and esophageal dilation. HPI: This 33 year old Caucasian male who has chronic GERD who underwent underwent EGD with dilation in September last year.  He he presents with dysphagia again.  He says last dilation helped him for 6 to 9 months.  He has difficulty with solids.  He points to mid sternal area as site of bolus obstruction.  He says heartburn is well controlled with his medication and dietary measures.  He states he has lost few pounds recently since he had umbilical hernia repair.  Surgery was 12 days ago.  He denies melena or rectal bleeding.  Past Medical History:  Diagnosis Date  . Arthritis   . Elevated LFTs    mildly elevated transaminases in past, now normalized   . GERD (gastroesophageal reflux disease)   . Gout   . History of kidney stones   . Hypertension     Past Surgical History:  Procedure Laterality Date  . APPENDECTOMY  01/27/2015  . CHOLECYSTECTOMY    . COLONOSCOPY WITH ESOPHAGOGASTRODUODENOSCOPY (EGD) N/A 02/25/2014   NL TI, 2 SIMPLE ADENOMAS(1:>1 CM), LGE IH-FG POLYPS, PROMINENT AMPULLA. Needs Colonoscopy surveillance in 2018  . ESOPHAGEAL DILATION N/A 08/30/2018   Procedure: ESOPHAGEAL DILATION;  Surgeon: Rogene Houston, MD;  Location: [facility];  Service: Endoscopy;  Laterality: N/A;  . ESOPHAGOGASTRODUODENOSCOPY N/A 04/29/2015   Dr. Oneida Alar: 1. mild non-erosive gastritis (inflammation) was found in the gastric antrum. 2. Prominent Ampullla 30mmx 77mm. benign path with pyloric metaplasia  . ESOPHAGOGASTRODUODENOSCOPY N/A 07/12/2016   Procedure: ESOPHAGOGASTRODUODENOSCOPY (EGD);  Surgeon: Danie Binder, MD;  Location: [facility];  Service: Endoscopy;  Laterality: N/A;  830  . ESOPHAGOGASTRODUODENOSCOPY N/A 08/30/2018   Procedure: ESOPHAGOGASTRODUODENOSCOPY (EGD);  Surgeon: Rogene Houston, MD;  Location: [facility];  Service: Endoscopy;  Laterality: N/A;   2:00  . EUS N/A 03/20/2014   Dr. Paulita Fujita: prominent major papilla s/p biopsy, minor papilla without clear adenomatous or mass-like appearance, chronic duodenitis on path.   Fatima Blank HERNIA REPAIR N/A 05/02/2019   Procedure: HERNIA REPAIR OPEN INCISIONAL WITH MESH;  Surgeon: Virl Cagey, MD;  Location: [facility];  Service: General;  Laterality: N/A;  . KIDNEY STONE SURGERY  age 72 and age 29   lithotripsy, stent  . SAVORY DILATION N/A 07/12/2016   Procedure: SAVORY DILATION;  Surgeon: Danie Binder, MD;  Location: [facility];  Service: Endoscopy;  Laterality: N/A;  . VENTRAL HERNIA REPAIR N/A 11/28/2019   Procedure: LAPAROSCOPIC VENTRAL HERNIA WITH MESH;  Surgeon: Virl Cagey, MD;  Location: [facility];  Service: General;  Laterality: N/A;  . WISDOM TOOTH EXTRACTION      Family History  Problem Relation Age of Onset  . Diabetes Father   . COPD Father   . Colon cancer Neg Hx        does not know paternal side   Social History:  reports that he has never smoked. He has never used smokeless tobacco. He reports that he does not drink alcohol or use drugs.  Allergies:  Allergies  Allergen Reactions  . Phenergan [Promethazine Hcl] Other (See Comments)    arn swelled and hurt for 3 days, pt would rather not have    Medications Prior to Admission  Medication Sig Dispense Refill  . allopurinol (ZYLOPRIM) 300 MG tablet TAKE ONE TABLET BY MOUTH TWICE DAILY. (Patient taking differently: Take 300 mg by mouth 2 (  two) times daily. ) 60 tablet 5  . amLODipine (NORVASC) 5 MG tablet TAKE ONE TABLET BY MOUTH AT BEDTIME. (Patient taking differently: Take 5 mg by mouth at bedtime. Take one tablet by mouth at bedtime) 90 tablet 0  . docusate sodium (COLACE) 100 MG capsule Take 1 capsule (100 mg total) by mouth 2 (two) times daily. 60 capsule 2  . ondansetron (ZOFRAN) 4 MG tablet Take 1 tablet (4 mg total) by mouth daily as needed for nausea or vomiting. 30 tablet 1  . oxyCODONE (ROXICODONE) 5  MG immediate release tablet Take 1 tablet (5 mg total) by mouth every 4 (four) hours as needed for severe pain or breakthrough pain. 30 tablet 0  . pantoprazole (PROTONIX) 40 MG tablet Take 1 tablet (40 mg total) by mouth 2 (two) times daily. 60 tablet 1    Results for orders placed or performed during the hospital encounter of 12/08/19 (from the past 48 hour(s))  SARS CORONAVIRUS 2 (TAT 6-24 HRS) Nasopharyngeal Nasopharyngeal Swab     Status: None   Collection Time: 12/08/19  2:08 PM   Specimen: Nasopharyngeal Swab  Result Value Ref Range   SARS Coronavirus 2 NEGATIVE NEGATIVE    Comment: (NOTE) SARS-CoV-2 target nucleic acids are NOT DETECTED. The SARS-CoV-2 RNA is generally detectable in upper and lower respiratory specimens during the acute phase of infection. Negative results do not preclude SARS-CoV-2 infection, do not rule out co-infections with other pathogens, and should not be used as the sole basis for treatment or other patient management decisions. Negative results must be combined with clinical observations, patient history, and epidemiological information. The expected result is Negative. Fact Sheet for Patients: SugarRoll.be Fact Sheet for Healthcare Providers: https://www.woods-mathews.com/ This test is not yet approved or cleared by the [place] FDA and  has been authorized for detection and/or diagnosis of SARS-CoV-2 by FDA under an Emergency Use Authorization (EUA). This EUA will remain  in effect (meaning this test can be used) for the duration of the COVID-19 declaration under Section 56 4(b)(1) of the Act, 21 U.S.C. section 360bbb-3(b)(1), unless the authorization is terminated or revoked sooner. Performed at [facility], [place] [address] [place], [place] [address]    No results found.  Review of Systems  Blood pressure 130/89, pulse 76, temperature 98.5 F (36.9 C), temperature source Oral, resp. rate  (!) 21, SpO2 100 %. Physical Exam  Constitutional: He appears well-developed and well-nourished.  HENT:  Mouth/Throat: Oropharynx is clear and moist.  Eyes: Conjunctivae are normal. No scleral icterus.  Neck: No thyromegaly present.  Cardiovascular: Normal rate, regular rhythm and normal heart sounds.  No murmur heard. Respiratory: Effort normal and breath sounds normal.  GI:  Abdomen is full.  Laparoscopy scars.  There is erythematous skin about the level of umbilicus.  Abdomen is soft and nontender with no organomegaly or masses.  Musculoskeletal:        General: No edema.  Lymphadenopathy:    He has no cervical adenopathy.  Neurological: He is alert.  Skin: Skin is warm and dry.     Assessment/Plan Esophageal dysphagia in patient with chronic GERD. Esophagogastroduodenoscopy with esophageal dilation.  Hildred Laser, MD 12/10/2019, 9:30 AM

## 2019-12-10 NOTE — Anesthesia Postprocedure Evaluation (Signed)
Anesthesia Post Note  Patient: [name]  Procedure(s) Performed: ESOPHAGOGASTRODUODENOSCOPY (EGD) WITH PROPOFOL (N/A ) ESOPHAGEAL DILATION (N/A ) BIOPSY POLYPECTOMY  Patient location during evaluation: PACU Anesthesia Type: General Level of consciousness: awake, oriented and patient cooperative Pain management: pain level controlled Vital Signs Assessment: post-procedure vital signs reviewed and stable Respiratory status: spontaneous breathing Cardiovascular status: stable Postop Assessment: no apparent nausea or vomiting Anesthetic complications: no     Last Vitals:  Vitals:   12/10/19 0857 12/10/19 1003  BP: 130/89 104/70  Pulse: 76 88  Resp: (!) 21 (!) 22  Temp: 36.9 C 36.8 C  SpO2: 100% 99%    Last Pain:  Vitals:   12/10/19 1003  TempSrc:   PainSc: (P) Asleep                 Cleo Villamizar A

## 2019-12-10 NOTE — Anesthesia Preprocedure Evaluation (Signed)
Anesthesia Evaluation  Patient identified by MRN, date of birth, ID band Patient awake    Reviewed: Allergy & Precautions, NPO status , Patient's Chart, lab work & pertinent test results  History of Anesthesia Complications Negative for: history of anesthetic complications  Airway Mallampati: II  TM Distance: >3 FB Neck ROM: Full    Dental  (+) Chipped, Dental Advisory Given,    Pulmonary neg pulmonary ROS,    Pulmonary exam normal breath sounds clear to auscultation       Cardiovascular Exercise Tolerance: Good hypertension, Pt. on medications Normal cardiovascular exam Rhythm:Regular Rate:Normal     Neuro/Psych negative neurological ROS  negative psych ROS   GI/Hepatic GERD  Medicated and Controlled,Elevated LFTs   Endo/Other  negative endocrine ROS  Renal/GU negative Renal ROS     Musculoskeletal  (+) Arthritis , Osteoarthritis,    Abdominal   Peds  Hematology negative hematology ROS (+)   Anesthesia Other Findings   Reproductive/Obstetrics                             Anesthesia Physical Anesthesia Plan  ASA: II  Anesthesia Plan: General   Post-op Pain Management:    Induction: Intravenous  PONV Risk Score and Plan: 0 and TIVA  Airway Management Planned: Nasal Cannula, Natural Airway and Simple Face Mask  Additional Equipment:   Intra-op Plan:   Post-operative Plan:   Informed Consent: I have reviewed the patients History and Physical, chart, labs and discussed the procedure including the risks, benefits and alternatives for the proposed anesthesia with the patient or authorized representative who has indicated his/her understanding and acceptance.     Dental advisory given  Plan Discussed with: CRNA  Anesthesia Plan Comments:         Anesthesia Quick Evaluation

## 2019-12-10 NOTE — Op Note (Signed)
[facility] Patient Name: [name] Procedure Date: 12/10/2019 9:22 AM MRN: [id number] Date of Birth: February 24, 1986 Attending MD: Hildred Laser , MD CSN: [id number] Age: 33 Admit Type: Outpatient Procedure:                Upper GI endoscopy Indications:              Esophageal dysphagia, Gastro-esophageal reflux                            disease Providers:                Hildred Laser, MD, Otis Peak B. Sharon Seller, RN, Raphael Gibney, Technician Referring MD:             Rosemary Holms, MD, Medicines:                Propofol per Anesthesia Complications:            No immediate complications. Estimated Blood Loss:     Estimated blood loss was minimal. Procedure:                Pre-Anesthesia Assessment:                           - Prior to the procedure, a History and Physical                            was performed, and patient medications and                            allergies were reviewed. The patient's tolerance of                            previous anesthesia was also reviewed. The risks                            and benefits of the procedure and the sedation                            options and risks were discussed with the patient.                            All questions were answered, and informed consent                            was obtained. Prior Anticoagulants: The patient has                            taken no previous anticoagulant or antiplatelet                            agents. ASA Grade Assessment: II - A patient with  mild systemic disease. After reviewing the risks                            and benefits, the patient was deemed in                            satisfactory condition to undergo the procedure.                           After obtaining informed consent, the endoscope was                            passed under direct vision. Throughout the                            procedure, the  patient's blood pressure, pulse, and                            oxygen saturations were monitored continuously. The                            [id number]) scope was introduced through the                            mouth, and advanced to the second part of duodenum.                            The upper GI endoscopy was accomplished without                            difficulty. The patient tolerated the procedure                            well. Scope In: 9:39:41 AM Scope Out: 9:58:25 AM Total Procedure Duration: 0 hours 18 minutes 44 seconds  Findings:      The examined esophagus was normal.      The Z-line was irregular and was found 38 cm from the incisors.      A 2 cm hiatal hernia was present.      No endoscopic abnormality was evident in the esophagus to explain the       patient's complaint of dysphagia. It was decided, however, to proceed       with dilation of the entire esophagus. The scope was withdrawn. Dilation       was performed with a Maloney dilator with no resistance at 56 Fr. The       dilation site was examined following endoscope reinsertion and showed no       change and no bleeding, mucosal tear or perforation. Biopsies were taken       with a cold forceps for histology. The pathology specimen was placed       into Bottle Number 3.      Multiple 4 to 10 mm pedunculated and sessile polyps with no bleeding and       no stigmata of recent bleeding were found in the gastric body. Biopsies  were taken with a cold forceps for histology. The pathology specimen was       placed into Bottle Number 2.      Localized nodular mucosa was found in the prepyloric region of the       stomach. Biopsies were taken with a cold forceps for histology. The       pathology specimen was placed into Bottle Number 1.      The exam of the stomach was otherwise normal.      The duodenal bulb and second portion of the duodenum were normal. Impression:               - Normal  esophagus. Biopsied.                           - Z-line irregular, 38 cm from the incisors.                           - 2 cm hiatal hernia.                           - No endoscopic esophageal abnormality to explain                            patient's dysphagia. Esophagus dilated. Biopsied.                           - Multiple gastric polyps. Biopsied.                           - Nodular mucosa in the prepyloric region of the                            stomach. Biopsied.                           - Normal duodenal bulb and second portion of the                            duodenum. Moderate Sedation:      Per Anesthesia Care Recommendation:           - Patient has a contact number available for                            emergencies. The signs and symptoms of potential                            delayed complications were discussed with the                            patient. Return to normal activities tomorrow.                            Written discharge instructions were provided to the                            patient.                           -  Resume previous diet today.                           - Continue present medications.                           - No aspirin, ibuprofen, naproxen, or other                            non-steroidal anti-inflammatory drugs for 1 day.                           - Await pathology results. Procedure Code(s):        --- Professional ---                           [phone], Esophagogastroduodenoscopy, flexible,                            transoral; with biopsy, single or multiple                           43450, Dilation of esophagus, by unguided sound or                            bougie, single or multiple passes Diagnosis Code(s):        --- Professional ---                           K22.8, Other specified diseases of esophagus                           K44.9, Diaphragmatic hernia without obstruction or                            gangrene                            K31.7, Polyp of stomach and duodenum                           K31.89, Other diseases of stomach and duodenum                           R13.14, Dysphagia, pharyngoesophageal phase                           K21.9, Gastro-esophageal reflux disease without                            esophagitis CPT copyright 2019 American Medical Association. All rights reserved. The codes documented in this report are preliminary and upon coder review may  be revised to meet current compliance requirements. Hildred Laser, MD Hildred Laser, MD 12/10/2019 10:10:36 AM This report has been signed electronically. Number of Addenda: 0

## 2019-12-10 NOTE — Transfer of Care (Signed)
Immediate Anesthesia Transfer of Care Note  Patient: [name]  Procedure(s) Performed: ESOPHAGOGASTRODUODENOSCOPY (EGD) WITH PROPOFOL (N/A ) ESOPHAGEAL DILATION (N/A ) BIOPSY POLYPECTOMY  Patient Location: PACU  Anesthesia Type:General  Level of Consciousness: awake, oriented and patient cooperative  Airway & Oxygen Therapy: Patient Spontanous Breathing  Post-op Assessment: Report given to RN and Post -op Vital signs reviewed and stable  Post vital signs: Reviewed and stable  Last Vitals:  Vitals Value Taken Time  BP 104/70 12/10/19 1003  Temp 36.8 C 12/10/19 1003  Pulse 80 12/10/19 1005  Resp 12 12/10/19 1005  SpO2 99 % 12/10/19 1005  Vitals shown include unvalidated device data.  Last Pain:  Vitals:   12/10/19 1003  TempSrc:   PainSc: (P) Asleep      Patients Stated Pain Goal: 7 ([id number])  Complications: No apparent anesthesia complications

## 2019-12-10 NOTE — Discharge Instructions (Signed)
No aspirin or NSAIDs for 24 hours. Resume usual medications and diet as before. No driving for 24 hours.   Esophageal Dilatation Esophageal dilatation, also called esophageal dilation, is a procedure to widen or open (dilate) a blocked or narrowed part of the esophagus. The esophagus is the part of the body that moves food and liquid from the mouth to the stomach. You may need this procedure if:  You have a buildup of scar tissue in your esophagus that makes it difficult, painful, or impossible to swallow. This can be caused by gastroesophageal reflux disease (GERD).  You have cancer of the esophagus.  There is a problem with how food moves through your esophagus. In some cases, you may need this procedure repeated at a later time to dilate the esophagus gradually. Tell a health care provider about:  Any allergies you have.  All medicines you are taking, including vitamins, herbs, eye drops, creams, and over-the-counter medicines.  Any problems you or family members have had with anesthetic medicines.  Any blood disorders you have.  Any surgeries you have had.  Any medical conditions you have.  Any antibiotic medicines you are required to take before dental procedures.  Whether you are pregnant or may be pregnant. What are the risks? Generally, this is a safe procedure. However, problems may occur, including:  Bleeding due to a tear in the lining of the esophagus.  A hole (perforation) in the esophagus. What happens before the procedure?  Follow instructions from your health care provider about eating or drinking restrictions.  Ask your health care provider about changing or stopping your regular medicines. This is especially important if you are taking diabetes medicines or blood thinners.  Plan to have someone take you home from the hospital or clinic.  Plan to have a responsible adult care for you for at least 24 hours after you leave the hospital or clinic. This is  important. What happens during the procedure?  You may be given a medicine to help you relax (sedative).  A numbing medicine may be sprayed into the back of your throat, or you may gargle the medicine.  Your health care provider may perform the dilatation using various surgical instruments, such as: ? Simple dilators. This instrument is carefully placed in the esophagus to stretch it. ? Guided wire bougies. This involves using an endoscope to insert a wire into the esophagus. A dilator is passed over this wire to enlarge the esophagus. Then the wire is removed. ? Balloon dilators. An endoscope with a small balloon at the end is inserted into the esophagus. The balloon is inflated to stretch the esophagus and open it up. The procedure may vary among health care providers and hospitals. What happens after the procedure?  Your blood pressure, heart rate, breathing rate, and blood oxygen level will be monitored until the medicines you were given have worn off.  Your throat may feel slightly sore and numb. This will improve slowly over time.  You will not be allowed to eat or drink until your throat is no longer numb.  When you are able to drink, urinate, and sit on the edge of the bed without nausea or dizziness, you may be able to return home. Follow these instructions at home:  Take over-the-counter and prescription medicines only as told by your health care provider.  Do not drive for 24 hours if you were given a sedative during your procedure.  You should have a responsible adult with you for 24  hours after the procedure.  Follow instructions from your health care provider about any eating or drinking restrictions.  Do not use any products that contain nicotine or tobacco, such as cigarettes and e-cigarettes. If you need help quitting, ask your health care provider.  Keep all follow-up visits as told by your health care provider. This is important. Get help right away if  you:  Have a fever.  Have chest pain.  Have pain that is not relieved by medication.  Have trouble breathing.  Have trouble swallowing.  Vomit blood. Summary  Esophageal dilatation, also called esophageal dilation, is a procedure to widen or open (dilate) a blocked or narrowed part of the esophagus.  Plan to have someone take you home from the hospital or clinic.  For this procedure, a numbing medicine may be sprayed into the back of your throat, or you may gargle the medicine.  Do not drive for 24 hours if you were given a sedative during your procedure. This information is not intended to replace advice given to you by your health care provider. Make sure you discuss any questions you have with your health care provider. Document Released: 01/20/2006 Document Revised: 11/11/2017 Document Reviewed: 10/04/2017 Elsevier Patient Education  2020 [place].  Upper Endoscopy, Adult, Care After This sheet gives you information about how to care for yourself after your procedure. Your health care provider may also give you more specific instructions. If you have problems or questions, contact your health care provider. What can I expect after the procedure? After the procedure, it is common to have:  A sore throat.  Mild stomach pain or discomfort.  Bloating.  Nausea. Follow these instructions at home:   Follow instructions from your health care provider about what to eat or drink after your procedure.  Return to your normal activities as told by your health care provider. Ask your health care provider what activities are safe for you.  Take over-the-counter and prescription medicines only as told by your health care provider.  Do not drive for 24 hours if you were given a sedative during your procedure.  Keep all follow-up visits as told by your health care provider. This is important. Contact a health care provider if you have:  A sore throat that lasts longer than  one day.  Trouble swallowing. Get help right away if:  You vomit blood or your vomit looks like coffee grounds.  You have: ? A fever. ? Bloody, black, or tarry stools. ? A severe sore throat or you cannot swallow. ? Difficulty breathing. ? Severe pain in your chest or abdomen. Summary  After the procedure, it is common to have a sore throat, mild stomach discomfort, bloating, and nausea.  Do not drive for 24 hours if you were given a sedative during the procedure.  Follow instructions from your health care provider about what to eat or drink after your procedure.  Return to your normal activities as told by your health care provider. This information is not intended to replace advice given to you by your health care provider. Make sure you discuss any questions you have with your health care provider. Document Released: 05/30/2012 Document Revised: 05/23/2018 Document Reviewed: 05/01/2018 Elsevier Patient Education  2020 [place] After These instructions provide you with information about caring for yourself after your procedure. Your health care provider may also give you more specific instructions. Your treatment has been planned according to current medical practices, but problems  sometimes occur. Call your health care provider if you have any problems or questions after your procedure. What can I expect after the procedure? After your procedure, you may:  Feel sleepy for several hours.  Feel clumsy and have poor balance for several hours.  Feel forgetful about what happened after the procedure.  Have poor judgment for several hours.  Feel nauseous or vomit.  Have a sore throat if you had a breathing tube during the procedure. Follow these instructions at home: For at least 24 hours after the procedure:      Have a responsible adult stay with you. It is important to have someone help care for you until you are awake and  alert.  Rest as needed.  Do not: ? Participate in activities in which you could fall or become injured. ? Drive. ? Use heavy machinery. ? Drink alcohol. ? Take sleeping pills or medicines that cause drowsiness. ? Make important decisions or sign legal documents. ? Take care of children on your own. Eating and drinking  Follow the diet that is recommended by your health care provider.  If you vomit, drink water, juice, or soup when you can drink without vomiting.  Make sure you have little or no nausea before eating solid foods. General instructions  Take over-the-counter and prescription medicines only as told by your health care provider.  If you have sleep apnea, surgery and certain medicines can increase your risk for breathing problems. Follow instructions from your health care provider about wearing your sleep device: ? Anytime you are sleeping, including during daytime naps. ? While taking prescription pain medicines, sleeping medicines, or medicines that make you drowsy.  If you smoke, do not smoke without supervision.  Keep all follow-up visits as told by your health care provider. This is important. Contact a health care provider if:  You keep feeling nauseous or you keep vomiting.  You feel light-headed.  You develop a rash.  You have a fever. Get help right away if:  You have trouble breathing. Summary  For several hours after your procedure, you may feel sleepy and have poor judgment.  Have a responsible adult stay with you for at least 24 hours or until you are awake and alert. This information is not intended to replace advice given to you by your health care provider. Make sure you discuss any questions you have with your health care provider. Document Released: 03/21/2016 Document Revised: 02/27/2018 Document Reviewed: 03/21/2016 Elsevier Patient Education  2020 [employer].

## 2019-12-11 ENCOUNTER — Other Ambulatory Visit: Payer: Self-pay

## 2019-12-11 LAB — SURGICAL PATHOLOGY

## 2019-12-18 ENCOUNTER — Ambulatory Visit (INDEPENDENT_AMBULATORY_CARE_PROVIDER_SITE_OTHER): Payer: Self-pay | Admitting: General Surgery

## 2019-12-18 ENCOUNTER — Encounter: Payer: Self-pay | Admitting: General Surgery

## 2019-12-18 ENCOUNTER — Other Ambulatory Visit: Payer: Self-pay

## 2019-12-18 ENCOUNTER — Other Ambulatory Visit (HOSPITAL_COMMUNITY): Payer: BC Managed Care – PPO

## 2019-12-18 VITALS — BP 125/80 | HR 64 | Temp 98.4°F | Resp 16 | Ht 70.0 in | Wt 255.0 lb

## 2019-12-18 DIAGNOSIS — K432 Incisional hernia without obstruction or gangrene: Secondary | ICD-10-CM

## 2019-12-18 MED ORDER — CYCLOBENZAPRINE HCL 5 MG PO TABS: 5.0000 mg | ORAL_TABLET | Freq: Three times a day (TID) | ORAL | Status: DC | PRN

## 2019-12-18 MED ORDER — CYCLOBENZAPRINE HCL 5 MG PO TABS: 5.0000 mg | ORAL_TABLET | Freq: Three times a day (TID) | ORAL | 0 refills | Status: DC | PRN

## 2019-12-18 NOTE — Progress Notes (Signed)
[facility] Note   HPI:  34 y.o. Male presents to clinic for post-op follow-up evaluation after a laparoscopic ventral hernia repair after having a recurrence from his previous repair. He says he has been having more pain and swelling around his periumbilical incision and pressure in that area. He says he has issues with spasm pain with eating that passes after a few seconds but it feels like something is pulling.  He says his stools have been diarrhea like but before were harder and then loose.  He is taking tylenol mostly for pain control. He says he has also had some pain in the groin area and has a history of kidney stones.   Review of Systems:  See above HPI All other review of systems: otherwise negative   Vital Signs:  BP 125/80 (BP Location: Left Arm, Patient Position: Sitting, Cuff Size: Normal)   Pulse 64   Temp 98.4 F (36.9 C) (Oral)   Resp 16   Ht 5\' 10"  (1.778 m)   Wt 255 lb (115.7 kg)   SpO2 98%   BMI 36.59 kg/m    Physical Exam:  Physical Exam Vitals reviewed.  Constitutional:      Appearance: Normal appearance.  HENT:     Head: Normocephalic.  Cardiovascular:     Rate and Rhythm: Normal rate.  Pulmonary:     Effort: Pulmonary effort is normal.  Abdominal:     General: There is no distension.     Palpations: Abdomen is soft.     Tenderness: There is abdominal tenderness.     Comments: Induration around the periumbilical incision from prior repair, port sites c/d/i with out any erythema or drainage, tender in the periumbilical region  Neurological:     Mental Status: He is alert.     Assessment:  34 y.o. yo Male s/p laparoscopic ventral hernia repair with mesh. He is having more pain this time after the large mesh repair and is having some spasm pain with eating that last a few minutes. I have a low suspicion at this time for a recurrence given the size of the mesh used and the tacking, but this is always in the back of my mind. The area at  this umbilicus feels more like a post op seroma/ hematoma that is becoming indurated and likely is from the prior mesh that was left in place being covered up and the hernia sac from the recurrence filling with fluid. This all should get better with time. If it does not we will plan for a repeat CT to ensure no recurrence or other abnormality.   Plan:  Try a heating pad on your abdomen to help with pain.  Take tylenol and ibuprofen as needed for pain control.  Tylenol 1000mg  @ 6am, 12noon, 6pm, 68midnight (Do not exceed 4000mg  of tylenol a day). Ibuprofen 800mg  @ 9am, 3pm, 9pm, 3am (Do not exceed 3600mg  of ibuprofen a day).  Try taking some fiber (benefiber/ metamucil) to get your stools soft but less loose/ diarrhea.  Use flexeril if needed for muscle spasm.  Referral to Urology for history of kidney stones and stones found on his prior CT    Future Appointments  Date Time Provider [place]  01/15/2020  9:00 AM Virl Cagey, MD [facility] None  12/23/2020  3:00 PM Rehman, Mechele Dawley, MD [facility] None    All of the above recommendations were discussed with the patient, and all of patient's questions were answered to his expressed satisfaction.  Curlene Labrum, MD [facility] [address] [place], [place] [address] [phone] (office)

## 2019-12-18 NOTE — Patient Instructions (Signed)
Try a heating pad on your abdomen to help with pain.  Take tylenol and ibuprofen as needed for pain control.  Tylenol 1000mg  @ 6am, 12noon, 6pm, 49midnight (Do not exceed 4000mg  of tylenol a day). Ibuprofen 800mg  @ 9am, 3pm, 9pm, 3am (Do not exceed 3600mg  of ibuprofen a day).  Try taking some fiber (benefiber/ metamucil) to get your stools soft but less loose/ diarrhea.  Use flexeril if needed for muscle spasm.

## 2019-12-20 ENCOUNTER — Encounter (HOSPITAL_COMMUNITY): Payer: Self-pay

## 2019-12-20 ENCOUNTER — Ambulatory Visit (HOSPITAL_COMMUNITY): Admit: 2019-12-20 | Payer: BC Managed Care – PPO | Admitting: Internal Medicine

## 2019-12-20 SURGERY — EGD (ESOPHAGOGASTRODUODENOSCOPY)
Anesthesia: Moderate Sedation

## 2020-01-07 ENCOUNTER — Other Ambulatory Visit (INDEPENDENT_AMBULATORY_CARE_PROVIDER_SITE_OTHER): Payer: Self-pay | Admitting: *Deleted

## 2020-01-07 DIAGNOSIS — R7401 Elevation of levels of liver transaminase levels: Secondary | ICD-10-CM

## 2020-01-14 ENCOUNTER — Encounter: Payer: Self-pay | Admitting: Family Medicine

## 2020-01-15 ENCOUNTER — Other Ambulatory Visit: Payer: Self-pay

## 2020-01-15 ENCOUNTER — Ambulatory Visit (INDEPENDENT_AMBULATORY_CARE_PROVIDER_SITE_OTHER): Payer: Self-pay | Admitting: General Surgery

## 2020-01-15 ENCOUNTER — Encounter: Payer: Self-pay | Admitting: General Surgery

## 2020-01-15 VITALS — BP 134/89 | HR 70 | Temp 98.2°F | Resp 16 | Ht 70.0 in | Wt 258.0 lb

## 2020-01-15 DIAGNOSIS — K432 Incisional hernia without obstruction or gangrene: Secondary | ICD-10-CM

## 2020-01-15 NOTE — Progress Notes (Signed)
[facility] Note   HPI:  34 y.o. Male presents to clinic for post-op follow-up evaluation after laparoscopic ventral hernia repair. Patient reports he is doing much better. He is starting back working but is not lifting. He says he gets intermittent sharp pains but this is subsiding. He has less of the pain with eating than he had prior.   Review of Systems:  No fever or chills Improving pain All other review of systems: otherwise negative   Vital Signs:  BP 134/89 (BP Location: Left Arm, Patient Position: Sitting, Cuff Size: Normal)   Pulse 70   Temp 98.2 F (36.8 C) (Oral)   Resp 16   Ht 5\' 10"  (1.778 m)   Wt 258 lb (117 kg)   SpO2 97%   BMI 37.02 kg/m    Physical Exam:  Physical Exam Vitals reviewed.  HENT:     Head: Normocephalic.     Mouth/Throat:     Mouth: Mucous membranes are moist.  Cardiovascular:     Rate and Rhythm: Normal rate.  Abdominal:     General: There is no distension.     Palpations: Abdomen is soft.     Tenderness: There is no abdominal tenderness.     Comments: Softening seroma over umbilical area  Skin:    General: Skin is warm and dry.  Neurological:     Mental Status: He is alert.     Assessment:  34 y.o. yo Male s/p laparoscopic ventral hernia repair for recurrent hernia. Doing fair and continuing to heal.  Plan:  - Area will continue to heal, pain will become less  - PRN follow up   All of the above recommendations were discussed with the patient, and all of patient's questions were answered to his expressed satisfaction.  Curlene Labrum, MD [facility] [address] [place], [place] [address] [phone] (office)

## 2020-01-30 ENCOUNTER — Encounter: Payer: Self-pay | Admitting: Urology

## 2020-01-30 ENCOUNTER — Other Ambulatory Visit: Payer: Self-pay

## 2020-01-30 ENCOUNTER — Ambulatory Visit (INDEPENDENT_AMBULATORY_CARE_PROVIDER_SITE_OTHER): Payer: BC Managed Care – PPO | Admitting: Urology

## 2020-01-30 VITALS — BP 138/83 | HR 116 | Temp 97.5°F | Ht 71.0 in | Wt 258.0 lb

## 2020-01-30 DIAGNOSIS — N200 Calculus of kidney: Secondary | ICD-10-CM | POA: Insufficient documentation

## 2020-01-30 LAB — POCT URINALYSIS DIPSTICK
Glucose, UA: NEGATIVE
Ketones, UA: NEGATIVE
Nitrite, UA: NEGATIVE
Protein, UA: NEGATIVE
Spec Grav, UA: >=1.03 — AB (ref 1.010–1.025)
Urobilinogen, UA: NEGATIVE E.U./dL — AB
pH, UA: 5 (ref 5.0–8.0)

## 2020-01-30 NOTE — Progress Notes (Signed)
Urological Symptom Review  Patient is experiencing the following symptoms: Frequent urination Burning/pain with urination Leakage of urine  Trouble starting stream Kidney stones  Review of Systems  Gastrointestinal (upper)  : Negative for upper GI symptoms  Gastrointestinal (lower) : Negative for lower GI symptoms  Constitutional : Negative for symptoms  Skin: Negative for skin symptoms  Eyes: Negative for eye symptoms  Ear/Nose/Throat : Negative for Ear/Nose/Throat symptoms  Hematologic/Lymphatic: Negative for Hematologic/Lymphatic symptoms  Cardiovascular : Negative for cardiovascular symptoms  Respiratory : Negative for respiratory symptoms  Endocrine: Negative for endocrine symptoms  Musculoskeletal: Negative for musculoskeletal symptoms  Neurological: Negative for neurological symptoms  Psychologic: Negative for psychiatric symptoms

## 2020-01-30 NOTE — Patient Instructions (Signed)
Dietary Guidelines to Help Prevent Kidney Stones Kidney stones are deposits of minerals and salts that form inside your kidneys. Your risk of developing kidney stones may be greater depending on your diet, your lifestyle, the medicines you take, and whether you have certain medical conditions. Most people can reduce their chances of developing kidney stones by following the instructions below. Depending on your overall health and the type of kidney stones you tend to develop, your dietitian may give you more specific instructions. What are tips for following this plan? Reading food labels  Choose foods with "no salt added" or "low-salt" labels. Limit your sodium intake to less than 1500 mg per day.  Choose foods with calcium for each meal and snack. Try to eat about 300 mg of calcium at each meal. Foods that contain 200-500 mg of calcium per serving include: ? 8 oz (237 ml) of milk, fortified nondairy milk, and fortified fruit juice. ? 8 oz (237 ml) of kefir, yogurt, and soy yogurt. ? 4 oz (118 ml) of tofu. ? 1 oz of cheese. ? 1 cup (300 g) of dried figs. ? 1 cup (91 g) of cooked broccoli. ? 1-3 oz can of sardines or mackerel.  Most people need 1000 to 1500 mg of calcium each day. Talk to your dietitian about how much calcium is recommended for you. Shopping  Buy plenty of fresh fruits and vegetables. Most people do not need to avoid fruits and vegetables, even if they contain nutrients that may contribute to kidney stones.  When shopping for convenience foods, choose: ? Whole pieces of fruit. ? Premade salads with dressing on the side. ? Low-fat fruit and yogurt smoothies.  Avoid buying frozen meals or prepared deli foods.  Look for foods with live cultures, such as yogurt and kefir. Cooking  Do not add salt to food when cooking. Place a salt shaker on the table and allow each person to add his or her own salt to taste.  Use vegetable protein, such as beans, textured vegetable  protein (TVP), or tofu instead of meat in pasta, casseroles, and soups. Meal planning   Eat less salt, if told by your dietitian. To do this: ? Avoid eating processed or premade food. ? Avoid eating fast food.  Eat less animal protein, including cheese, meat, poultry, or fish, if told by your dietitian. To do this: ? Limit the number of times you have meat, poultry, fish, or cheese each week. Eat a diet free of meat at least 2 days a week. ? Eat only one serving each day of meat, poultry, fish, or seafood. ? When you prepare animal protein, cut pieces into small portion sizes. For most meat and fish, one serving is about the size of one deck of cards.  Eat at least 5 servings of fresh fruits and vegetables each day. To do this: ? Keep fruits and vegetables on hand for snacks. ? Eat 1 piece of fruit or a handful of berries with breakfast. ? Have a salad and fruit at lunch. ? Have two kinds of vegetables at dinner.  Limit foods that are high in a substance called oxalate. These include: ? Spinach. ? Rhubarb. ? Beets. ? Potato chips and french fries. ? Nuts.  If you regularly take a diuretic medicine, make sure to eat at least 1-2 fruits or vegetables high in potassium each day. These include: ? Avocado. ? Banana. ? Orange, prune, carrot, or tomato juice. ? Baked potato. ? Cabbage. ? Beans and split   peas. General instructions   Drink enough fluid to keep your urine clear or pale yellow. This is the most important thing you can do.  Talk to your health care provider and dietitian about taking daily supplements. Depending on your health and the cause of your kidney stones, you may be advised: ? Not to take supplements with vitamin C. ? To take a calcium supplement. ? To take a daily probiotic supplement. ? To take other supplements such as magnesium, fish oil, or vitamin B6.  Take all medicines and supplements as told by your health care provider.  Limit alcohol intake to no  more than 1 drink a day for nonpregnant women and 2 drinks a day for men. One drink equals 12 oz of beer, 5 oz of wine, or 1 oz of hard liquor.  Lose weight if told by your health care provider. Work with your dietitian to find strategies and an eating plan that works best for you. What foods are not recommended? Limit your intake of the following foods, or as told by your dietitian. Talk to your dietitian about specific foods you should avoid based on the type of kidney stones and your overall health. Grains Breads. Bagels. Rolls. Baked goods. Salted crackers. Cereal. Pasta. Vegetables Spinach. Rhubarb. Beets. Canned vegetables. Pickles. Olives. Meats and other protein foods Nuts. Nut butters. Large portions of meat, poultry, or fish. Salted or cured meats. Deli meats. Hot dogs. Sausages. Dairy Cheese. Beverages Regular soft drinks. Regular vegetable juice. Seasonings and other foods Seasoning blends with salt. Salad dressings. Canned soups. Soy sauce. Ketchup. Barbecue sauce. Canned pasta sauce. Casseroles. Pizza. Lasagna. Frozen meals. Potato chips. French fries. Summary  You can reduce your risk of kidney stones by making changes to your diet.  The most important thing you can do is drink enough fluid. You should drink enough fluid to keep your urine clear or pale yellow.  Ask your health care provider or dietitian how much protein from animal sources you should eat each day, and also how much salt and calcium you should have each day. This information is not intended to replace advice given to you by your health care provider. Make sure you discuss any questions you have with your health care provider. Document Revised: 03/21/2019 Document Reviewed: 11/09/2016 Elsevier Patient Education  2020 Elsevier Inc.  

## 2020-01-30 NOTE — Progress Notes (Signed)
01/30/2020 2:25 PM   [name] 1986-07-10 [id number]  Referring provider: Mikey Thomas, [place],  [name] [address]  nephrolithiasis  HPI: Mr [name] is a 34yo seen for evaluation of nephrolithiasis. His first stone event was at age 25. He has had over 5 stone events. His last stone event was 5 years ago. He was previously followed by Dr. Exie Thomas. He previously had ureteroscopy and ESWL. Stone composition was calcium oxalate per patient. He is on allopurinol 300mg  daily for gout. No current flank pain. CT from 11/2019 showed bilateral 2-38mm renal calculi, no ureteral calculi and no hydronephrosis.    PMH: Past Medical History:  Diagnosis Date  . Acid reflux   . Arthritis   . Elevated LFTs    mildly elevated transaminases in past, now normalized   . GERD (gastroesophageal reflux disease)   . Gout   . History of kidney stones   . Hypertension     Surgical History: Past Surgical History:  Procedure Laterality Date  . APPENDECTOMY  01/27/2015  . BIOPSY  12/10/2019   Procedure: BIOPSY;  Surgeon: Rogene Houston, MD;  [facility];  Service: Endoscopy;;  esophagus gastric  . CHOLECYSTECTOMY    . COLONOSCOPY WITH ESOPHAGOGASTRODUODENOSCOPY (EGD) N/A 02/25/2014   NL TI, 2 SIMPLE ADENOMAS(1:>1 CM), LGE IH-FG POLYPS, PROMINENT AMPULLA. Needs Colonoscopy surveillance in 2018  . ESOPHAGEAL DILATION N/A 08/30/2018   Procedure: ESOPHAGEAL DILATION;  Surgeon: Rogene Houston, MD;  [facility];  Service: Endoscopy;  Laterality: N/A;  . ESOPHAGEAL DILATION N/A 12/10/2019   Procedure: ESOPHAGEAL DILATION;  Surgeon: Rogene Houston, MD;  [facility];  Service: Endoscopy;  Laterality: N/A;  . ESOPHAGOGASTRODUODENOSCOPY N/A 04/29/2015   Dr. Oneida Alar: 1. mild non-erosive gastritis (inflammation) was found in the gastric antrum. 2. Prominent Ampullla 28mmx 27mm. benign path with pyloric metaplasia  . ESOPHAGOGASTRODUODENOSCOPY  N/A 07/12/2016   Procedure: ESOPHAGOGASTRODUODENOSCOPY (EGD);  Surgeon: Danie Binder, MD;  [facility];  Service: Endoscopy;  Laterality: N/A;  830  . ESOPHAGOGASTRODUODENOSCOPY N/A 08/30/2018   Procedure: ESOPHAGOGASTRODUODENOSCOPY (EGD);  Surgeon: Rogene Houston, MD;  [facility];  Service: Endoscopy;  Laterality: N/A;  2:00  . ESOPHAGOGASTRODUODENOSCOPY (EGD) WITH PROPOFOL N/A 12/10/2019   Procedure: ESOPHAGOGASTRODUODENOSCOPY (EGD) WITH PROPOFOL;  Surgeon: Rogene Houston, MD;  [facility];  Service: Endoscopy;  Laterality: N/A;  12:55  . EUS N/A 03/20/2014   Dr. Paulita Fujita: prominent major papilla s/p biopsy, minor papilla without clear adenomatous or mass-like appearance, chronic duodenitis on path.   Fatima Blank HERNIA REPAIR N/A 05/02/2019   Procedure: HERNIA REPAIR OPEN INCISIONAL WITH MESH;  Surgeon: Virl Cagey, MD;  Location: [facility];  Service: General;  Laterality: N/A;  . KIDNEY STONE SURGERY  age 73 and age 60   lithotripsy, stent  . POLYPECTOMY  12/10/2019   Procedure: POLYPECTOMY;  Surgeon: Rogene Houston, MD;  [facility];  Service: Endoscopy;;  gastric  . SAVORY DILATION N/A 07/12/2016   Procedure: SAVORY DILATION;  Surgeon: Danie Binder, MD;  [facility];  Service: Endoscopy;  Laterality: N/A;  . VENTRAL HERNIA REPAIR N/A 11/28/2019   Procedure: LAPAROSCOPIC VENTRAL HERNIA WITH MESH;  Surgeon: Virl Cagey, MD;  Location: [facility];  Service: General;  Laterality: N/A;  . WISDOM TOOTH EXTRACTION      Home Medications:  Allergies as of 01/30/2020      Reactions  Phenergan [promethazine Hcl] Other (See Comments)   arn swelled and hurt for 3 days, pt would rather not have      Medication List       Accurate as of January 30, 2020  2:25 PM. If you have any questions, ask your nurse or doctor.        allopurinol 300 MG tablet Commonly known as: ZYLOPRIM TAKE ONE TABLET BY MOUTH TWICE DAILY.     amLODipine 5 MG tablet Commonly known as: NORVASC TAKE ONE TABLET BY MOUTH AT BEDTIME.   cyclobenzaprine 5 MG tablet Commonly known as: FLEXERIL Take 1 tablet (5 mg total) by mouth 3 (three) times daily as needed for muscle spasms.   pantoprazole 40 MG tablet Commonly known as: PROTONIX Take 1 tablet (40 mg total) by mouth 2 (two) times daily.       Allergies:  Allergies  Allergen Reactions  . Phenergan [Promethazine Hcl] Other (See Comments)    arn swelled and hurt for 3 days, pt would rather not have    Family History: Family History  Problem Relation Age of Onset  . Diabetes Father   . COPD Father   . Colon cancer Neg Hx        does not know paternal side    Social History:  reports that he has never smoked. He has never used smokeless tobacco. He reports that he does not drink alcohol or use drugs.  ROS: All other review of systems were reviewed and are negative except what is noted above in HPI  Physical Exam: BP 138/83   Pulse (!) 116   Temp (!) 97.5 F (36.4 C)   Ht 5\' 11"  (1.803 m)   Wt 258 lb (117 kg)   BMI 35.98 kg/m   Constitutional:  Alert and oriented, No acute distress. HEENT: [place] AT, moist mucus membranes.  Trachea midline, no masses. Cardiovascular: No clubbing, cyanosis, or edema. Respiratory: Normal respiratory effort, no increased work of breathing. GI: Abdomen is soft, nontender, nondistended, no abdominal masses GU: No CVA tenderness Lymph: No cervical or inguinal lymphadenopathy. Skin: No rashes, bruises or suspicious lesions. Neurologic: Grossly intact, no focal deficits, moving all 4 extremities. Psychiatric: Normal mood and affect.  Laboratory Data: Lab Results  Component Value Date   WBC 6.4 11/06/2019   HGB 15.5 11/06/2019   HCT 45.2 11/06/2019   MCV 89.0 11/06/2019   PLT 246 11/06/2019    Lab Results  Component Value Date   CREATININE 1.11 11/06/2019    No results found for: PSA  No results found for:  TESTOSTERONE  No results found for: HGBA1C  Urinalysis    Component Value Date/Time   COLORURINE YELLOW 02/24/2009 0912   APPEARANCEUR CLEAR 02/24/2009 0912   LABSPEC >1.030 (H) 02/24/2009 0912   PHURINE 6.0 02/24/2009 0912   GLUCOSEU NEGATIVE 02/24/2009 0912   HGBUR LARGE (A) 02/24/2009 0912   BILIRUBINUR small 01/30/2020 1421   [place] 02/24/2009 0912   PROTEINUR Negative 01/30/2020 1421   PROTEINUR 30 (A) 02/24/2009 0912   UROBILINOGEN negative (A) 01/30/2020 1421   UROBILINOGEN 0.2 02/24/2009 0912   NITRITE neg 01/30/2020 1421   NITRITE NEGATIVE 02/24/2009 0912   LEUKOCYTESUR Trace (A) 01/30/2020 1421    No results found for: LABMICR, [place], RBCUA, LABEPIT, MUCUS, BACTERIA  Pertinent Imaging: CT abd w/wo contrast from 11/2019: Images reviewed and discussed with patient Results for orders placed during the hospital encounter of 06/25/05  DG Abd 1 View   Narrative Clinical Data:  Kidney stone.  ABDOMEN - ONE VIEW:  Findings: Approximate 2 x 3 mm calcific density projects over the region of the upper pole of the right kidney. This is also noted on 04/16/05 plain film and is compatible with a small calculus. No definite calcifications along the distribution of the ureters.  IMPRESSION:  Small right renal calculus.  Provider: Briscoe Burns   No results found for this or any previous visit. No results found for this or any previous visit. No results found for this or any previous visit. No results found for this or any previous visit. No results found for this or any previous visit. No results found for this or any previous visit. No results found for this or any previous visit.  Assessment & Plan:    1. Kidney stones --We discussed the management of kidney stones. These options include observation, ureteroscopy, shockwave lithotripsy (ESWL) and percutaneous nephrolithotomy (PCNL). We discussed which options are relevant to the patient's stone(s). We discussed  the natural history of kidney stones as well as the complications of untreated stones and the impact on quality of life without treatment as well as with each of the above listed treatments. We also discussed the efficacy of each treatment in its ability to clear the stone burden. With any of these management options I discussed the signs and symptoms of infection and the need for emergent treatment should these be experienced. For each option we discussed the ability of each procedure to clear the patient of their stone burden.   For observation I described the risks which include but are not limited to silent renal damage, life-threatening infection, need for emergent surgery, failure to pass stone and pain.   For ureteroscopy I described the risks which include bleeding, infection, damage to contiguous structures, positioning injury, ureteral stricture, ureteral avulsion, ureteral injury, need for prolonged ureteral stent, inability to perform ureteroscopy, need for an interval procedure, inability to clear stone burden, stent discomfort/pain, heart attack, stroke, pulmonary embolus and the inherent risks with general anesthesia.   For shockwave lithotripsy I described the risks which include arrhythmia, kidney contusion, kidney hemorrhage, need for transfusion, pain, inability to adequately break up stone, inability to pass stone fragments, Steinstrasse, infection associated with obstructing stones, need for alternate surgical procedure, need for repeat shockwave lithotripsy, MI, CVA, PE and the inherent risks with anesthesia/conscious sedation.   For PCNL I described the risks including positioning injury, pneumothorax, hydrothorax, need for chest tube, inability to clear stone burden, renal laceration, arterial venous fistula or malformation, need for embolization of kidney, loss of kidney or renal function, need for repeat procedure, need for prolonged nephrostomy tube, ureteral avulsion, MI, CVA, PE  and the inherent risks of general anesthesia.   - The patient would like to proceed with observation. RTC 6 months with renal US and [name]  - POCT urinalysis dipstick   No follow-ups on file.  Nicolette Bang, MD  [facility] [place]

## 2020-02-18 ENCOUNTER — Other Ambulatory Visit: Payer: Self-pay | Admitting: Family Medicine

## 2020-02-18 NOTE — Telephone Encounter (Signed)
I reviewed b p from specialty visits last three mo, all  good, refill meds times six mo

## 2020-02-18 NOTE — Telephone Encounter (Signed)
Last med check up 05/18/19

## 2020-04-22 ENCOUNTER — Encounter: Payer: Self-pay | Admitting: General Surgery

## 2020-05-06 ENCOUNTER — Encounter: Payer: Self-pay | Admitting: General Surgery

## 2020-05-06 ENCOUNTER — Ambulatory Visit (INDEPENDENT_AMBULATORY_CARE_PROVIDER_SITE_OTHER): Payer: BC Managed Care – PPO | Admitting: General Surgery

## 2020-05-06 ENCOUNTER — Other Ambulatory Visit: Payer: Self-pay

## 2020-05-06 VITALS — BP 128/83 | HR 60 | Temp 96.6°F | Resp 14 | Ht 71.0 in | Wt 259.2 lb

## 2020-05-06 DIAGNOSIS — K5900 Constipation, unspecified: Secondary | ICD-10-CM

## 2020-05-06 DIAGNOSIS — R1033 Periumbilical pain: Secondary | ICD-10-CM | POA: Insufficient documentation

## 2020-05-06 NOTE — Progress Notes (Signed)
[facility] Note   HPI:  34 y.o. Male presents to clinic for follow-up evaluation after calling about having right sided periumbilical pain. He had pain in this area when he recurred. He has since undergone a laparoscopic hernia repair with mesh and the mesh was 15X20cm.  He says that he also has been constipated.   Review of Systems:  Firmer stools, less often Right sided cramping pain  All other review of systems: otherwise negative   Vital Signs:  BP 128/83   Pulse 60   Temp (!) 96.6 F (35.9 C) (Temporal)   Resp 14   Ht 5\' 11"  (1.803 m)   Wt 259 lb 3.2 oz (117.6 kg)   SpO2 98%   BMI 36.15 kg/m    Physical Exam:  Physical Exam Vitals reviewed.  HENT:     Head: Normocephalic.  Cardiovascular:     Rate and Rhythm: Normal rate.  Pulmonary:     Effort: Pulmonary effort is normal.  Abdominal:     General: There is no distension.     Palpations: Abdomen is soft.     Tenderness: There is abdominal tenderness.     Comments: Right of umbilicus tender, ridge felt, no obvious defect but difficult to palpate, feel indurated ridge area   Musculoskeletal:        General: Normal range of motion.  Neurological:     General: No focal deficit present.     Mental Status: He is alert.     Assessment:  34 y.o. yo Male with periumbilical pain and concern for recurrence of the hernia. This could be from the mesh pulling internally and causing strain versus a true recurrence.  He also has some constipation.   Plan:  CT ordered to check for recurrence hernia. Will call with results.  Take Ibuprofen for pain and uses heating pad.  Drink 64 ounces of water/ liquid a day. Take benefiber/ metamucil daily to keep stools soft and regular.   All of the above recommendations were discussed with the patient, and all of patient's questions were answered to his expressed satisfaction.  Curlene Labrum, MD [facility] [address] [place], [place] [address] [phone] (office)

## 2020-05-06 NOTE — Patient Instructions (Addendum)
CT ordered to check for recurrence hernia. Will call with results.  Take Ibuprofen for pain and uses heating pad.  Drink 64 ounces of water/ liquid a day. Take benefiber/ metamucil daily to keep stools soft and regular.

## 2020-05-26 ENCOUNTER — Ambulatory Visit (HOSPITAL_COMMUNITY): Payer: BC Managed Care – PPO

## 2020-05-26 ENCOUNTER — Telehealth: Payer: Self-pay | Admitting: Family Medicine

## 2020-05-26 NOTE — Telephone Encounter (Signed)
Called [employer] for [place] of abd/pelvis that was denied. They need peer to peer before the will approve it.  [phone] and reference members id number [id number]

## 2020-05-29 NOTE — Telephone Encounter (Signed)
CT was approved but for 05/30/2020 only. Scheduling had cancelled the CT per their protocol however I called and the apt for 05/30/2020 had not been filled. Called [employer] and spoke to [place] and put him back on schedule for 05/30/2020. Authorization # [id number]. Pt aware of time, NPO 4 hours prior and has already picked up contrast.

## 2020-05-30 ENCOUNTER — Ambulatory Visit (HOSPITAL_COMMUNITY)
Admission: RE | Admit: 2020-05-30 | Discharge: 2020-05-30 | Disposition: A | Discharge status: Home / Self Care | Payer: BC Managed Care – PPO | Source: Ambulatory Visit | Attending: General Surgery | Admitting: General Surgery

## 2020-05-30 ENCOUNTER — Encounter (HOSPITAL_COMMUNITY): Payer: Self-pay

## 2020-05-30 ENCOUNTER — Ambulatory Visit (HOSPITAL_COMMUNITY): Payer: BC Managed Care – PPO

## 2020-05-30 ENCOUNTER — Other Ambulatory Visit: Payer: Self-pay

## 2020-05-30 ENCOUNTER — Encounter: Payer: Self-pay | Admitting: General Surgery

## 2020-05-30 ENCOUNTER — Telehealth: Payer: Self-pay | Admitting: General Surgery

## 2020-05-30 DIAGNOSIS — N200 Calculus of kidney: Secondary | ICD-10-CM | POA: Diagnosis not present

## 2020-05-30 DIAGNOSIS — R1033 Periumbilical pain: Secondary | ICD-10-CM | POA: Insufficient documentation

## 2020-05-30 LAB — POCT I-STAT CREATININE: Creatinine, Ser: 1.4 mg/dL — ABNORMAL HIGH (ref 0.61–1.24)

## 2020-05-30 MED ORDER — IOHEXOL 300 MG/ML  SOLN
125.0000 mL | Freq: Once | INTRAMUSCULAR | Status: AC | PRN
  Administered 2020-05-30: 125 mL via INTRAVENOUS

## 2020-05-30 NOTE — Telephone Encounter (Signed)
[facility]  CT done and reviewed. Official read is not back yet.  Looks like some pre peritoneal fat is coming through the subcutaneous tissue but the mesh and tacs are in place and no herniation from intraperitoneal / below mesh location.   Discussed him coming in to see the images in the upcoming weeks. Will call back if official read says anything different.  Curlene Labrum, MD [facility] [address] [place], [place] [address] [phone] (office)

## 2020-06-09 ENCOUNTER — Ambulatory Visit (INDEPENDENT_AMBULATORY_CARE_PROVIDER_SITE_OTHER): Payer: BC Managed Care – PPO | Admitting: Family Medicine

## 2020-06-09 ENCOUNTER — Other Ambulatory Visit: Payer: Self-pay

## 2020-06-09 VITALS — BP 126/82 | Temp 97.6°F | Ht 71.0 in | Wt 261.8 lb

## 2020-06-09 DIAGNOSIS — M109 Gout, unspecified: Secondary | ICD-10-CM | POA: Diagnosis not present

## 2020-06-09 MED ORDER — INDOMETHACIN 50 MG PO CAPS: 50.0000 mg | ORAL_CAPSULE | Freq: Three times a day (TID) | ORAL | 2 refills | Status: DC | PRN

## 2020-06-09 MED ORDER — COLCHICINE 0.6 MG PO CAPS: ORAL_CAPSULE | ORAL | 2 refills | Status: DC

## 2020-06-09 NOTE — Progress Notes (Signed)
   Subjective:    Patient ID: [name], male    DOB: October 12, 1986, 34 y.o.   MRN: [id number]  HPI  Patient arrives with flare of gout in left ankle since weekend. Patient states it started in his toe and settled in his ankle. Patient has history of gout. Significant gout related issues for started in the great toe region then migrated to the ankle he has had multiple bouts he does take allopurinol he does try to watch his diet denies any major setbacks PMH benign Review of Systems Please see above    Objective:   Physical Exam  Lungs clear respiratory rate normal heart regular extremities no edema significant soreness in the ankle no sign of cellulitis      Assessment & Plan:  Acute gout Recommend indomethacin 3 times daily until pain is relieved over the next 3 to 5 days May also use colchicine as directed could cause diarrhea Continue allopurinol Do lab work in the near future

## 2020-06-10 ENCOUNTER — Ambulatory Visit: Payer: BC Managed Care – PPO | Admitting: General Surgery

## 2020-06-19 ENCOUNTER — Other Ambulatory Visit (HOSPITAL_COMMUNITY): Payer: BC Managed Care – PPO

## 2020-07-03 ENCOUNTER — Telehealth: Payer: Self-pay | Admitting: Urology

## 2020-07-03 NOTE — Telephone Encounter (Signed)
Pt. request  sooner appt due to several kidney stones causing pain. Appt. rescheduled sooner.

## 2020-07-03 NOTE — Telephone Encounter (Signed)
I called pt to reschedule appt for 8/18 and he states he is still having issues and pain. He request a nurse call him back to go over symptoms.

## 2020-07-07 ENCOUNTER — Telehealth: Payer: Self-pay

## 2020-07-07 NOTE — Telephone Encounter (Signed)
Called left. Message. Opening available for July 27th.

## 2020-07-07 NOTE — Telephone Encounter (Signed)
-----   Message from [facility], LPN sent at 3/95/8441  3:57 PM EDT ----- Pt. was rescheduled to Sept from 8/18 and states he cant wait that long. His [name] showed several stones and he request to be seen sooner due to pain. I put him in at 3:45 on 8/9 is this ok..or is this just for new stone pt.s?

## 2020-07-21 ENCOUNTER — Ambulatory Visit: Payer: BC Managed Care – PPO | Admitting: Urology

## 2020-07-23 ENCOUNTER — Ambulatory Visit (INDEPENDENT_AMBULATORY_CARE_PROVIDER_SITE_OTHER): Payer: BC Managed Care – PPO | Admitting: Urology

## 2020-07-23 ENCOUNTER — Encounter: Payer: Self-pay | Admitting: Urology

## 2020-07-23 ENCOUNTER — Other Ambulatory Visit: Payer: Self-pay

## 2020-07-23 VITALS — BP 121/73 | HR 67 | Temp 98.2°F | Ht 71.0 in | Wt 261.8 lb

## 2020-07-23 DIAGNOSIS — N200 Calculus of kidney: Secondary | ICD-10-CM

## 2020-07-23 LAB — URINALYSIS, ROUTINE W REFLEX MICROSCOPIC
Bilirubin, UA: NEGATIVE
Glucose, UA: NEGATIVE
Ketones, UA: NEGATIVE
Leukocytes,UA: NEGATIVE
Nitrite, UA: NEGATIVE
Protein,UA: NEGATIVE
RBC, UA: NEGATIVE
Specific Gravity, UA: >1.03 — ABNORMAL HIGH (ref 1.005–1.030)
Urobilinogen, Ur: 0.2 mg/dL (ref 0.2–1.0)
pH, UA: 5.5 (ref 5.0–7.5)

## 2020-07-23 NOTE — Progress Notes (Signed)
07/23/2020 4:04 PM   [name] 1986-04-19 [id number]  Referring provider: Mikey Kirschner, MD No address on file  nephrolithiasis  HPI: Mr Mangual is a 34yo here for followup nephrolithiasis. No stone events since last visit. He passed a stone 2 weeks ago. CT from 05/2020 showed bilateral renal calculi.  No flank pain currently. He drinks sprite, occasional water. He takes allpurinol 300mg  daily   PMH: Past Medical History:  Diagnosis Date  . Acid reflux   . Arthritis   . Elevated LFTs    mildly elevated transaminases in past, now normalized   . GERD (gastroesophageal reflux disease)   . Gout   . History of kidney stones   . Hypertension     Surgical History: Past Surgical History:  Procedure Laterality Date  . APPENDECTOMY  01/27/2015  . BIOPSY  12/10/2019   Procedure: BIOPSY;  Surgeon: Rogene Houston, MD;  [facility]: [facility];  Service: Endoscopy;;  esophagus gastric  . CHOLECYSTECTOMY    . COLONOSCOPY WITH ESOPHAGOGASTRODUODENOSCOPY (EGD) N/A 02/25/2014   NL TI, 2 SIMPLE ADENOMAS(1:>1 CM), LGE IH-FG POLYPS, PROMINENT AMPULLA. Needs Colonoscopy surveillance in 2018  . ESOPHAGEAL DILATION N/A 08/30/2018   Procedure: ESOPHAGEAL DILATION;  Surgeon: Rogene Houston, MD;  [facility];  Service: Endoscopy;  Laterality: N/A;  . ESOPHAGEAL DILATION N/A 12/10/2019   Procedure: ESOPHAGEAL DILATION;  Surgeon: Rogene Houston, MD;  [facility];  Service: Endoscopy;  Laterality: N/A;  . ESOPHAGOGASTRODUODENOSCOPY N/A 04/29/2015   Dr. Oneida Alar: 1. mild non-erosive gastritis (inflammation) was found in the gastric antrum. 2. Prominent Ampullla 53mmx 35mm. benign path with pyloric metaplasia  . ESOPHAGOGASTRODUODENOSCOPY N/A 07/12/2016   Procedure: ESOPHAGOGASTRODUODENOSCOPY (EGD);  Surgeon: Danie Binder, MD;  [facility];  Service: Endoscopy;  Laterality: N/A;  830  . ESOPHAGOGASTRODUODENOSCOPY N/A 08/30/2018   Procedure:  ESOPHAGOGASTRODUODENOSCOPY (EGD);  Surgeon: Rogene Houston, MD;  [facility];  Service: Endoscopy;  Laterality: N/A;  2:00  . ESOPHAGOGASTRODUODENOSCOPY (EGD) WITH PROPOFOL N/A 12/10/2019   Procedure: ESOPHAGOGASTRODUODENOSCOPY (EGD) WITH PROPOFOL;  Surgeon: Rogene Houston, MD;  [facility];  Service: Endoscopy;  Laterality: N/A;  12:55  . EUS N/A 03/20/2014   Dr. Paulita Fujita: prominent major papilla s/p biopsy, minor papilla without clear adenomatous or mass-like appearance, chronic duodenitis on path.   Fatima Blank HERNIA REPAIR N/A 05/02/2019   Procedure: HERNIA REPAIR OPEN INCISIONAL WITH MESH;  Surgeon: Virl Cagey, MD;  Location: [facility];  Service: General;  Laterality: N/A;  . KIDNEY STONE SURGERY  age 53 and age 48   lithotripsy, stent  . POLYPECTOMY  12/10/2019   Procedure: POLYPECTOMY;  Surgeon: Rogene Houston, MD;  [facility];  Service: Endoscopy;;  gastric  . SAVORY DILATION N/A 07/12/2016   Procedure: SAVORY DILATION;  Surgeon: Danie Binder, MD;  [facility];  Service: Endoscopy;  Laterality: N/A;  . VENTRAL HERNIA REPAIR N/A 11/28/2019   Procedure: LAPAROSCOPIC VENTRAL HERNIA WITH MESH;  Surgeon: Virl Cagey, MD;  Location: [facility];  Service: General;  Laterality: N/A;  . WISDOM TOOTH EXTRACTION      Home Medications:  Allergies as of 07/23/2020      Reactions   Phenergan [promethazine Hcl] Other (See Comments)   arn swelled and hurt for 3 days, pt would rather not have      Medication List       Accurate as of July 23, 2020  4:04 PM. If you have any questions, ask your nurse or doctor.        allopurinol 300 MG tablet Commonly known as: ZYLOPRIM TAKE ONE TABLET BY MOUTH TWICE DAILY.   amLODipine 5 MG tablet Commonly known as: NORVASC TAKE ONE TABLET BY MOUTH AT BEDTIME.   Colchicine 0.6 MG Caps Commonly known as: Mitigare One tid prn gout till relief   cyclobenzaprine 5 MG tablet Commonly known  as: FLEXERIL Take 1 tablet (5 mg total) by mouth 3 (three) times daily as needed for muscle spasms.   indomethacin 50 MG capsule Commonly known as: INDOCIN Take 1 capsule (50 mg total) by mouth 3 (three) times daily as needed.   pantoprazole 40 MG tablet Commonly known as: PROTONIX Take 1 tablet (40 mg total) by mouth 2 (two) times daily.       Allergies:  Allergies  Allergen Reactions  . Phenergan [Promethazine Hcl] Other (See Comments)    arn swelled and hurt for 3 days, pt would rather not have    Family History: Family History  Problem Relation Age of Onset  . Diabetes Father   . COPD Father   . Colon cancer Neg Hx        does not know paternal side    Social History:  reports that he has never smoked. He has never used smokeless tobacco. He reports that he does not drink alcohol and does not use drugs.  ROS: All other review of systems were reviewed and are negative except what is noted above in HPI  Physical Exam: BP 121/73   Pulse 67   Temp 98.2 F (36.8 C)   Ht 5\' 11"  (1.803 m)   Wt 261 lb 12.8 oz (118.8 kg)   BMI 36.51 kg/m   Constitutional:  Alert and oriented, No acute distress. HEENT: [place] AT, moist mucus membranes.  Trachea midline, no masses. Cardiovascular: No clubbing, cyanosis, or edema. Respiratory: Normal respiratory effort, no increased work of breathing. GI: Abdomen is soft, nontender, nondistended, no abdominal masses GU: No CVA tenderness.  Lymph: No cervical or inguinal lymphadenopathy. Skin: No rashes, bruises or suspicious lesions. Neurologic: Grossly intact, no focal deficits, moving all 4 extremities. Psychiatric: Normal mood and affect.  Laboratory Data: Lab Results  Component Value Date   WBC 6.4 11/06/2019   HGB 15.5 11/06/2019   HCT 45.2 11/06/2019   MCV 89.0 11/06/2019   PLT 246 11/06/2019    Lab Results  Component Value Date   CREATININE 1.40 (H) 05/30/2020    No results found for: PSA  No results found for:  TESTOSTERONE  No results found for: HGBA1C  Urinalysis    Component Value Date/Time   COLORURINE YELLOW 02/24/2009 0912   APPEARANCEUR Clear 07/23/2020 1532   LABSPEC >1.030 (H) 02/24/2009 0912   PHURINE 6.0 02/24/2009 0912   GLUCOSEU Negative 07/23/2020 1532   HGBUR LARGE (A) 02/24/2009 0912   BILIRUBINUR Negative 07/23/2020 1532   KETONESUR NEGATIVE 02/24/2009 0912   PROTEINUR Negative 07/23/2020 1532   PROTEINUR 30 (A) 02/24/2009 0912   UROBILINOGEN negative (A) 01/30/2020 1421   UROBILINOGEN 0.2 02/24/2009 0912   NITRITE Negative 07/23/2020 1532   NITRITE NEGATIVE 02/24/2009 0912   LEUKOCYTESUR Negative 07/23/2020 1532    Lab Results  Component Value Date   LABMICR Comment 07/23/2020    Pertinent Imaging: CT 05/30/2020: Images reviewed and discussed with the patient Results for orders placed during the hospital encounter of 06/25/05  DG Abd 1 View  Narrative Clinical  Data: Kidney stone. ABDOMEN - ONE VIEW: Findings: Approximate 2 x 3 mm calcific density projects over the region of the upper pole of the right kidney. This is also noted on 04/16/05 plain film and is compatible with a small calculus. No definite calcifications along the distribution of the ureters.  Impression Small right renal calculus.  Provider: Briscoe Burns  No results found for this or any previous visit.  No results found for this or any previous visit.  No results found for this or any previous visit.  No results found for this or any previous visit.  No results found for this or any previous visit.  No results found for this or any previous visit.  No results found for this or any previous visit.   Assessment & Plan:    1. Kidney stones -RTC 1 year with renal US -continue allpurinol 300mg  qhs - Urinalysis, Routine w reflex microscopic   No follow-ups on file.  Nicolette Bang, MD  [facility] [place]

## 2020-07-23 NOTE — Patient Instructions (Addendum)
Dietary Guidelines to Help Prevent Kidney Stones Kidney stones are deposits of minerals and salts that form inside your kidneys. Your risk of developing kidney stones may be greater depending on your diet, your lifestyle, the medicines you take, and whether you have certain medical conditions. Most people can reduce their chances of developing kidney stones by following the instructions below. Depending on your overall health and the type of kidney stones you tend to develop, your dietitian may give you more specific instructions. What are tips for following this plan? Reading food labels  Choose foods with "no salt added" or "low-salt" labels. Limit your sodium intake to less than 1500 mg per day.  Choose foods with calcium for each meal and snack. Try to eat about 300 mg of calcium at each meal. Foods that contain 200-500 mg of calcium per serving include: ? 8 oz (237 ml) of milk, fortified nondairy milk, and fortified fruit juice. ? 8 oz (237 ml) of kefir, yogurt, and soy yogurt. ? 4 oz (118 ml) of tofu. ? 1 oz of cheese. ? 1 cup (300 g) of dried figs. ? 1 cup (91 g) of cooked broccoli. ? 1-3 oz can of sardines or mackerel.  Most people need 1000 to 1500 mg of calcium each day. Talk to your dietitian about how much calcium is recommended for you. Shopping  Buy plenty of fresh fruits and vegetables. Most people do not need to avoid fruits and vegetables, even if they contain nutrients that may contribute to kidney stones.  When shopping for convenience foods, choose: ? Whole pieces of fruit. ? Premade salads with dressing on the side. ? Low-fat fruit and yogurt smoothies.  Avoid buying frozen meals or prepared deli foods.  Look for foods with live cultures, such as yogurt and kefir. Cooking  Do not add salt to food when cooking. Place a salt shaker on the table and allow each person to add his or her own salt to taste.  Use vegetable protein, such as beans, textured vegetable  protein (TVP), or tofu instead of meat in pasta, casseroles, and soups. Meal planning   Eat less salt, if told by your dietitian. To do this: ? Avoid eating processed or premade food. ? Avoid eating fast food.  Eat less animal protein, including cheese, meat, poultry, or fish, if told by your dietitian. To do this: ? Limit the number of times you have meat, poultry, fish, or cheese each week. Eat a diet free of meat at least 2 days a week. ? Eat only one serving each day of meat, poultry, fish, or seafood. ? When you prepare animal protein, cut pieces into small portion sizes. For most meat and fish, one serving is about the size of one deck of cards.  Eat at least 5 servings of fresh fruits and vegetables each day. To do this: ? Keep fruits and vegetables on hand for snacks. ? Eat 1 piece of fruit or a handful of berries with breakfast. ? Have a salad and fruit at lunch. ? Have two kinds of vegetables at dinner.  Limit foods that are high in a substance called oxalate. These include: ? Spinach. ? Rhubarb. ? Beets. ? Potato chips and french fries. ? Nuts.  If you regularly take a diuretic medicine, make sure to eat at least 1-2 fruits or vegetables high in potassium each day. These include: ? Avocado. ? Banana. ? Orange, prune, carrot, or tomato juice. ? Baked potato. ? Cabbage. ? Beans and split   peas. General instructions   Drink enough fluid to keep your urine clear or pale yellow. This is the most important thing you can do.  Talk to your health care provider and dietitian about taking daily supplements. Depending on your health and the cause of your kidney stones, you may be advised: ? Not to take supplements with vitamin C. ? To take a calcium supplement. ? To take a daily probiotic supplement. ? To take other supplements such as magnesium, fish oil, or vitamin B6.  Take all medicines and supplements as told by your health care provider.  Limit alcohol intake to no  more than 1 drink a day for nonpregnant women and 2 drinks a day for men. One drink equals 12 oz of beer, 5 oz of wine, or 1 oz of hard liquor.  Lose weight if told by your health care provider. Work with your dietitian to find strategies and an eating plan that works best for you. What foods are not recommended? Limit your intake of the following foods, or as told by your dietitian. Talk to your dietitian about specific foods you should avoid based on the type of kidney stones and your overall health. Grains Breads. Bagels. Rolls. Baked goods. Salted crackers. Cereal. Pasta. Vegetables Spinach. Rhubarb. Beets. Canned vegetables. [name]. Olives. Meats and other protein foods Nuts. Nut butters. Large portions of meat, poultry, or fish. Salted or cured meats. Deli meats. Hot dogs. Sausages. Dairy Cheese. Beverages Regular soft drinks. Regular vegetable juice. Seasonings and other foods Seasoning blends with salt. Salad dressings. Canned soups. Soy sauce. Ketchup. Barbecue sauce. Canned pasta sauce. Casseroles. Pizza. Lasagna. Frozen meals. Potato chips. [place] fries. Summary  You can reduce your risk of kidney stones by making changes to your diet.  The most important thing you can do is drink enough fluid. You should drink enough fluid to keep your urine clear or pale yellow.  Ask your health care provider or dietitian how much protein from animal sources you should eat each day, and also how much salt and calcium you should have each day. This information is not intended to replace advice given to you by your health care provider. Make sure you discuss any questions you have with your health care provider. Document Revised: 03/21/2019 Document Reviewed: 11/09/2016 Elsevier Patient Education  2020 [employer].  Vasectomy Vasectomy is a procedure in which the tube that carries sperm from the testicle to the urethra (vas deferens) is tied. It may also be cut. The procedure blocks sperm  from going through the vas deferens and penis during ejaculation. This ensures that sperm does not go into the vagina during sex. Vasectomy does not affect your sexual desire or performance, and does not prevent sexually transmitted diseases. Vasectomy is considered a permanent and very effective form of birth control (contraception). The decision to have a vasectomy should not be made during a stressful situation, such as after the loss of a pregnancy or a divorce. You and your partner should make the decision to have a vasectomy when you are sure that you do not want children in the future. Tell a health care provider about:  Any allergies you have.  All medicines you are taking, including vitamins, herbs, eye drops, creams, and over-the-counter medicines.  Any problems you or family members have had with anesthetic medicines.  Any blood disorders you have.  Any surgeries you have had.  Any medical conditions you have. What are the risks? Generally, this is a safe procedure. However,  problems may occur, including:  Infection.  Bleeding and swelling of the scrotum.  Allergic reactions to medicines.  Failure of the procedure to prevent pregnancy. There is a very small chance that the cut ends of the vas deferens may reconnect (recanalization), meaning that you could still make a woman pregnant.  Pain in the scrotum that continues after healing from the procedure. What happens before the procedure?  Ask your health care provider about: ? Changing or stopping your regular medicines. This is especially important if you are taking diabetes medicines or blood thinners. ? Taking over-the-counter medicines, vitamins, herbs, and supplements. ? Taking medicines such as aspirin and ibuprofen. These medicines can thin your blood. Do not take these medicines unless your health care provider tells you to take them.  You may be asked to shower with a germ-killing soap.  Plan to have someone  take you home from the hospital or clinic. What happens during the procedure?   To lower your risk of infection: ? Your health care team will wash or sanitize their hands. ? Hair may be removed from the surgical area. ? Your scrotum will be washed with soap.  You will be given one or more of the following: ? A medicine to help you relax (sedative). You may be instructed to take this a few hours before the procedure. ? A medicine to numb the area (local anesthetic).  Your health care provider will feel (palpate) for your vas deferens.  To reach the vas deferens, one of two methods may be used: ? A very small incision may be made in your scrotum. ? A punctured opening may be made in your scrotum, without an incision.  Your vas deferens will be pulled out of your scrotum, and may be: ? Tied off. ? Cut and possibly burned (cauterized) at the ends to seal them off.  The vas deferens will be put back into your scrotum.  The incision or puncture opening will be closed with absorbable stitches (sutures). The sutures will eventually dissolve and will not need to be removed after the procedure. The procedure may vary among health care providers and hospitals. What happens after the procedure?  You will be monitored to make sure that you do not experience problems.  You will be asked not to ejaculate for at least 1 week after the procedure, or as long as directed.  You will need to use a different form of contraception for 2-4 months after the procedure, until you have test results confirming that there are no sperm in your semen.  You may be given scrotal support to wear, such as a jock strap or underwear with a supportive pouch.  Do not drive for 24 hours if you were given a sedative to help you relax. Summary  Vasectomy is considered a permanent and very effective form of birth control (contraception). The procedure prevents sperm from being released during ejaculation.  Your  scrotum will be numbed with medicine (local anesthetic) for the procedure.  After the procedure, you will be asked not to ejaculate for at least 1 week, or for as long as directed. You will also need to use a different form of contraception until your health care provider examines you and finds that there are no sperm in your semen. This information is not intended to replace advice given to you by your health care provider. Make sure you discuss any questions you have with your health care provider. Document Revised: 06/02/2018 Document Reviewed: 02/25/2017 Elsevier  Patient Education  [employer].

## 2020-07-23 NOTE — Progress Notes (Signed)
Urological Symptom Review  Patient is experiencing the following symptoms: Frequent urination Burning/pain with urination Get up at night to urinate Leakage of urine Blood in urine  Kidney stones   Review of Systems  Gastrointestinal (upper)  : Indigestion/heartburn  Gastrointestinal (lower) : Negative for lower GI symptoms  Constitutional : Negative for symptoms  Skin: Negative for skin symptoms  Eyes: Negative for eye symptoms  Ear/Nose/Throat : Negative for Ear/Nose/Throat symptoms  Hematologic/Lymphatic: Negative for Hematologic/Lymphatic symptoms  Cardiovascular : Leg swelling  Respiratory : Negative for respiratory symptoms  Endocrine: Excessive thirst  Musculoskeletal: Joint pain  Neurological: Negative for neurological symptoms  Psychologic: Negative for psychiatric symptoms

## 2020-07-30 ENCOUNTER — Ambulatory Visit: Payer: BC Managed Care – PPO | Admitting: Urology

## 2020-08-12 ENCOUNTER — Telehealth: Payer: Self-pay | Admitting: Family Medicine

## 2020-08-12 NOTE — Telephone Encounter (Signed)
Pt needs refill sent to pharmacy on amLODipine (NORVASC) 5 MG tablet [facility] - [place], [place]   Pt call back [phone]

## 2020-08-13 NOTE — Telephone Encounter (Signed)
LVM to schedule

## 2020-08-15 ENCOUNTER — Telehealth: Payer: Self-pay | Admitting: Family Medicine

## 2020-08-15 MED ORDER — AMLODIPINE BESYLATE 5 MG PO TABS: 5.0000 mg | ORAL_TABLET | Freq: Every day | ORAL | 0 refills | Status: DC

## 2020-08-15 NOTE — Telephone Encounter (Signed)
done

## 2020-08-15 NOTE — Telephone Encounter (Signed)
Patient is requesting refill on amlodipine 5 mg has been out for 2 days now and has appointment on 9/22 that was the first available for medication follow up. [name]

## 2020-08-19 ENCOUNTER — Other Ambulatory Visit: Payer: Self-pay | Admitting: *Deleted

## 2020-08-19 MED ORDER — AMLODIPINE BESYLATE 5 MG PO TABS: 5.0000 mg | ORAL_TABLET | Freq: Every day | ORAL | 0 refills | Status: DC

## 2020-08-19 NOTE — Telephone Encounter (Signed)
Please advise. Thank you

## 2020-08-19 NOTE — Telephone Encounter (Signed)
Refill sent and pt was notified.

## 2020-08-19 NOTE — Telephone Encounter (Signed)
Patient schedule appointment for 9/22 for medication follow up

## 2020-08-19 NOTE — Telephone Encounter (Signed)
May have 30 days

## 2020-08-21 ENCOUNTER — Ambulatory Visit: Payer: BC Managed Care – PPO | Admitting: Urology

## 2020-09-03 ENCOUNTER — Encounter: Payer: Self-pay | Admitting: Family Medicine

## 2020-09-03 ENCOUNTER — Ambulatory Visit (INDEPENDENT_AMBULATORY_CARE_PROVIDER_SITE_OTHER): Payer: BC Managed Care – PPO | Admitting: Family Medicine

## 2020-09-03 ENCOUNTER — Other Ambulatory Visit: Payer: Self-pay

## 2020-09-03 VITALS — BP 124/74 | HR 71 | Temp 97.4°F | Wt 262.2 lb

## 2020-09-03 DIAGNOSIS — I10 Essential (primary) hypertension: Secondary | ICD-10-CM | POA: Diagnosis not present

## 2020-09-03 DIAGNOSIS — K219 Gastro-esophageal reflux disease without esophagitis: Secondary | ICD-10-CM

## 2020-09-03 DIAGNOSIS — R748 Abnormal levels of other serum enzymes: Secondary | ICD-10-CM

## 2020-09-03 DIAGNOSIS — M1009 Idiopathic gout, multiple sites: Secondary | ICD-10-CM | POA: Diagnosis not present

## 2020-09-03 DIAGNOSIS — E785 Hyperlipidemia, unspecified: Secondary | ICD-10-CM

## 2020-09-03 MED ORDER — AMLODIPINE BESYLATE 5 MG PO TABS: 5.0000 mg | ORAL_TABLET | Freq: Every day | ORAL | 5 refills | Status: DC

## 2020-09-03 MED ORDER — ALLOPURINOL 300 MG PO TABS: 300.0000 mg | ORAL_TABLET | Freq: Two times a day (BID) | ORAL | 5 refills | Status: DC

## 2020-09-03 NOTE — Progress Notes (Signed)
Patient ID: [name], male    DOB: [birth year], 34 y.o.   MRN: [id number]   Chief Complaint  Patient presents with  . Hypertension  . Gout   Subjective:    HPI Pt has h/o gout and htn. Had colchicine for gout. Rt elbow, knees, and ankles are the places normally getting gout attacks. Taking allopurinol and then for flares cholcine and indomethacin.  htn- amlodipine. taking it at night time.  Doing well. Compliant with meds. No chest pain, leg swelling, sob, or headache/dizziness.  GERD- stable.  Taking protonix.  Not having any flares of reflux. No bloody or black stools.  Elevated LFTs.- elevated on last labs in 11/20 with elevated ALT at 69, now elevated ast 68 and ALT at 96.  Has been elevated a few times in past back to 2014. 2016- weight was 244 lbs. 2017- weight was 251 lbs, and now at 262 lbs.  Medical History [place] has a past medical history of Acid reflux, Arthritis, Elevated LFTs, GERD (gastroesophageal reflux disease), Gout, History of kidney stones, and Hypertension.   Outpatient Encounter Medications as of 09/03/2020  Medication Sig  . allopurinol (ZYLOPRIM) 300 MG tablet Take 1 tablet (300 mg total) by mouth 2 (two) times daily.  Marland Kitchen amLODipine (NORVASC) 5 MG tablet Take 1 tablet (5 mg total) by mouth at bedtime.  . Colchicine (MITIGARE) 0.6 MG CAPS One tid prn gout till relief  . pantoprazole (PROTONIX) 40 MG tablet Take 1 tablet (40 mg total) by mouth 2 (two) times daily.  . [DISCONTINUED] allopurinol (ZYLOPRIM) 300 MG tablet TAKE ONE TABLET BY MOUTH TWICE DAILY. (Patient taking differently: Take 300 mg by mouth 2 (two) times daily. )  . [DISCONTINUED] amLODipine (NORVASC) 5 MG tablet Take 1 tablet (5 mg total) by mouth at bedtime.  . [DISCONTINUED] cyclobenzaprine (FLEXERIL) 5 MG tablet Take 1 tablet (5 mg total) by mouth 3 (three) times daily as needed for muscle spasms. (Patient not taking: Reported on 01/30/2020)  . [DISCONTINUED] indomethacin  (INDOCIN) 50 MG capsule Take 1 capsule (50 mg total) by mouth 3 (three) times daily as needed. (Patient not taking: Reported on 07/23/2020)   No facility-administered encounter medications on file as of 09/03/2020.     Review of Systems  Constitutional: Negative for chills and fever.  HENT: Negative for congestion, rhinorrhea and sore throat.   Respiratory: Negative for cough, shortness of breath and wheezing.   Cardiovascular: Negative for chest pain and leg swelling.  Gastrointestinal: Negative for abdominal pain, diarrhea, nausea and vomiting.  Genitourinary: Negative for dysuria and frequency.  Musculoskeletal: Positive for arthralgias and joint swelling.       +intermittent gout attacks.  Skin: Negative for rash.  Neurological: Negative for dizziness, weakness and headaches.     Vitals BP 124/74   Pulse 71   Temp (!) 97.4 F (36.3 C)   Wt 262 lb 3.2 oz (118.9 kg)   SpO2 100%   BMI 36.57 kg/m   Objective:   Physical Exam Vitals and nursing note reviewed.  Constitutional:      General: He is not in acute distress.    Appearance: Normal appearance. He is not ill-appearing.  HENT:     Head: Normocephalic.     Nose: Nose normal. No congestion.     Mouth/Throat:     Mouth: Mucous membranes are moist.     Pharynx: No oropharyngeal exudate.  Eyes:     Extraocular Movements: Extraocular movements intact.     Conjunctiva/sclera:  Conjunctivae normal.     Pupils: Pupils are equal, round, and reactive to light.  Cardiovascular:     Rate and Rhythm: Normal rate and regular rhythm.     Pulses: Normal pulses.     Heart sounds: Normal heart sounds. No murmur heard.   Pulmonary:     Effort: Pulmonary effort is normal.     Breath sounds: Normal breath sounds. No wheezing, rhonchi or rales.  Musculoskeletal:        General: No swelling. Normal range of motion.     Right lower leg: No edema.     Left lower leg: No edema.  Skin:    General: Skin is warm and dry.      Findings: No rash.  Neurological:     General: No focal deficit present.     Mental Status: He is alert and oriented to person, place, and time.     Cranial Nerves: No cranial nerve deficit.  Psychiatric:        Mood and Affect: Mood normal.        Behavior: Behavior normal.        Thought Content: Thought content normal.        Judgment: Judgment normal.      Assessment and Plan   1. Idiopathic gout of multiple sites, unspecified chronicity - allopurinol (ZYLOPRIM) 300 MG tablet; Take 1 tablet (300 mg total) by mouth 2 (two) times daily.  Dispense: 60 tablet; Refill: 5  2. Gastroesophageal reflux disease, unspecified whether esophagitis present - CBC - CMP14+EGFR - Lipid panel  3. Essential hypertension - CBC - CMP14+EGFR - Lipid panel - amLODipine (NORVASC) 5 MG tablet; Take 1 tablet (5 mg total) by mouth at bedtime.  Dispense: 30 tablet; Refill: 5  4. Elevated liver enzymes - Comprehensive metabolic panel   gerd- stable, cont protonix.  Gout- stable, no flares at this time.  Cont allopurinol. Using colcrys and indomethacin prn.  htn- stable, cont meds.  Elevated LFTs-  Advising to watch diet and increase in exercising and decrease alcohol intake.  Will recheck next visit.  May need u/s ruq.  HLD- elevated LDL and TG. Cont to monitor.  Renal u/s- for concern of nephrolithiasis-  Ultrasound of kidneys ordered by Dr. Alyson Ingles in 8/21.  Pending.   F/u [address] prn.

## 2020-09-04 LAB — LIPID PANEL
Chol/HDL Ratio: 5.2 ratio — ABNORMAL HIGH (ref 0.0–5.0)
Cholesterol, Total: 177 mg/dL (ref 100–199)
HDL: 34 mg/dL — ABNORMAL LOW (ref >39)
LDL Chol Calc (NIH): 115 mg/dL — ABNORMAL HIGH (ref 0–99)
Triglycerides: 157 mg/dL — ABNORMAL HIGH (ref 0–149)
VLDL Cholesterol Cal: 28 mg/dL (ref 5–40)

## 2020-09-04 LAB — CBC
Hematocrit: 44 % (ref 37.5–51.0)
Hemoglobin: 15.8 g/dL (ref 13.0–17.7)
MCH: 32.2 pg (ref 26.6–33.0)
MCHC: 35.9 g/dL — ABNORMAL HIGH (ref 31.5–35.7)
MCV: 90 fL (ref 79–97)
Platelets: 232 10*3/uL (ref 150–450)
RBC: 4.9 x10E6/uL (ref 4.14–5.80)
RDW: 13.1 % (ref 11.6–15.4)
WBC: 7.8 10*3/uL (ref 3.4–10.8)

## 2020-09-04 LAB — CMP14+EGFR
ALT: 96 IU/L — ABNORMAL HIGH (ref 0–44)
AST: 68 IU/L — ABNORMAL HIGH (ref 0–40)
Albumin/Globulin Ratio: 1.6 (ref 1.2–2.2)
Albumin: 4.7 g/dL (ref 4.0–5.0)
Alkaline Phosphatase: 84 IU/L (ref 44–121)
BUN/Creatinine Ratio: 10 (ref 9–20)
BUN: 12 mg/dL (ref 6–20)
Bilirubin Total: 0.9 mg/dL (ref 0.0–1.2)
CO2: 26 mmol/L (ref 20–29)
Calcium: 9.6 mg/dL (ref 8.7–10.2)
Chloride: 103 mmol/L (ref 96–106)
Creatinine, Ser: 1.19 mg/dL (ref 0.76–1.27)
GFR calc Af Amer: 92 mL/min/{1.73_m2} (ref >59)
GFR calc non Af Amer: 80 mL/min/{1.73_m2} (ref >59)
Globulin, Total: 2.9 g/dL (ref 1.5–4.5)
Glucose: 84 mg/dL (ref 65–99)
Potassium: 4 mmol/L (ref 3.5–5.2)
Sodium: 141 mmol/L (ref 134–144)
Total Protein: 7.6 g/dL (ref 6.0–8.5)

## 2020-09-06 ENCOUNTER — Encounter: Payer: Self-pay | Admitting: Family Medicine

## 2020-09-06 DIAGNOSIS — E785 Hyperlipidemia, unspecified: Secondary | ICD-10-CM | POA: Insufficient documentation

## 2020-09-12 IMAGING — CT CT ABDOMEN W/ CM
3 of 5 series · 12 of 46 positions shown, 17 images · IV contrast (omnipaque)
Comparison: None

CLINICAL DATA: Incisional hernia.

EXAM:
CT ABDOMEN WITH CONTRAST
TECHNIQUE: Multidetector CT imaging of the abdomen was performed using the
standard protocol following bolus administration of intravenous
contrast.
CONTRAST:  100mL OMNIPAQUE IOHEXOL 300 MG/ML  SOLN

[Series 5: coronal st · coronal · 0.58mm/px · 3 of 115 slices shown]
[im 39/115  soft-tissue]
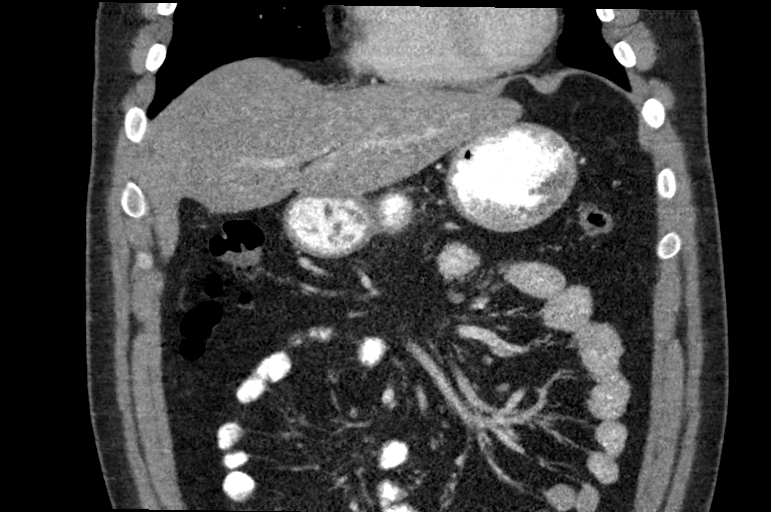
[im 51/115  soft-tissue]
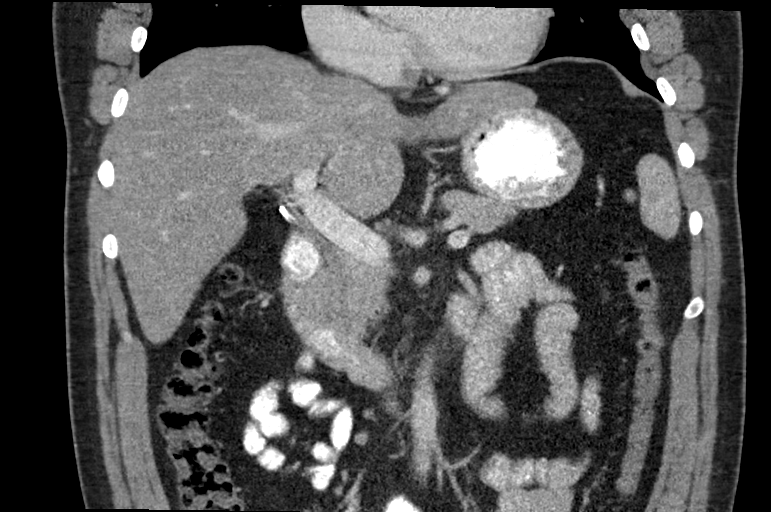
[im 64/115  soft-tissue]
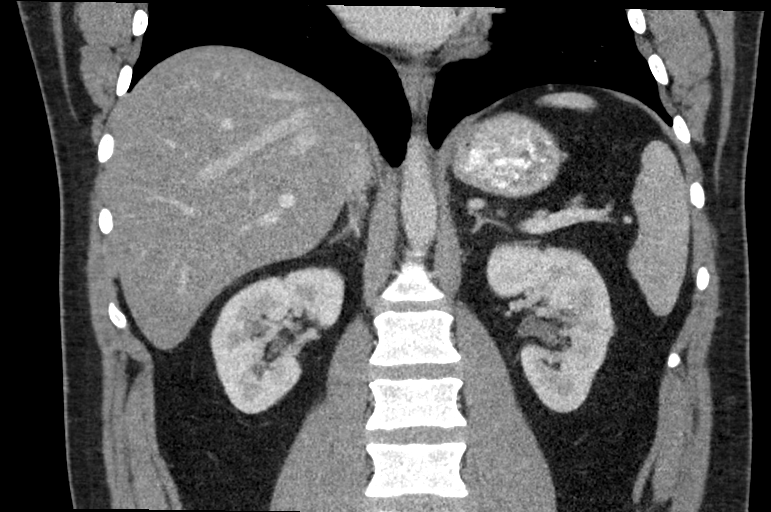

[Series 6: sagittal st · sagittal · 0.59mm/px · 1 of 149 slices shown]
[im 50/149  soft-tissue]
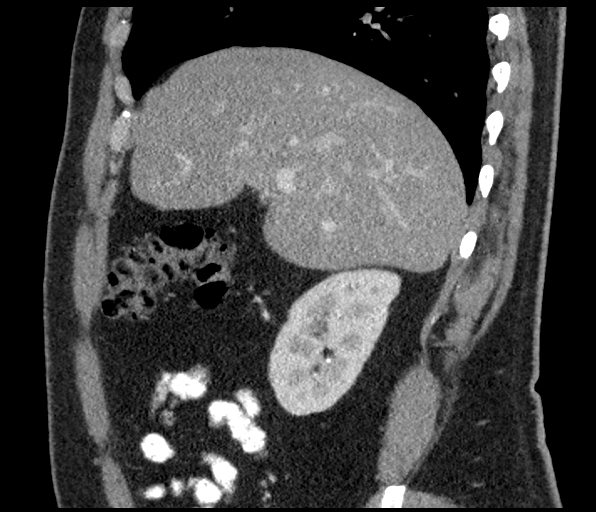

[Series 7: delay · axial · delayed · 0.83mm/px · z∈[+818,+1068]mm · 8 of 66 slices shown, 13 images]
[im 8/66  soft-tissue]
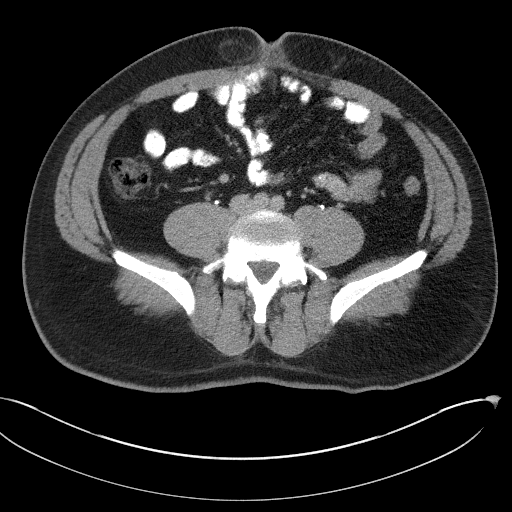
[im 8/66  bone]
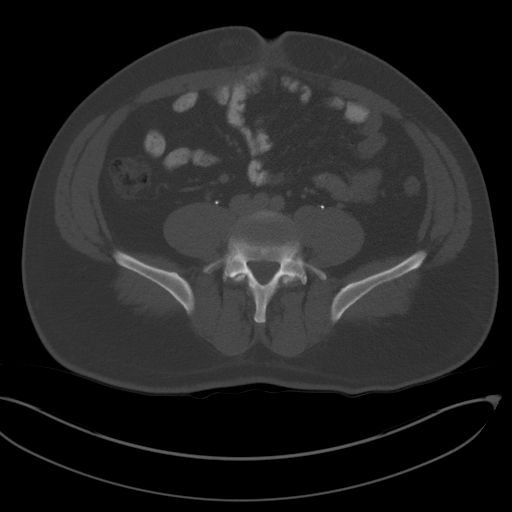
[im 15/66  soft-tissue]
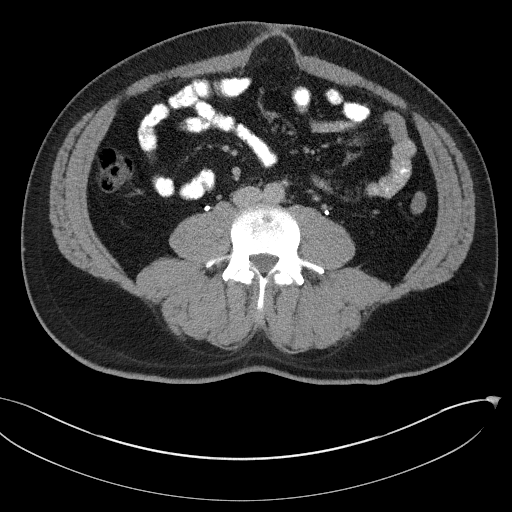
[im 22/66  soft-tissue]
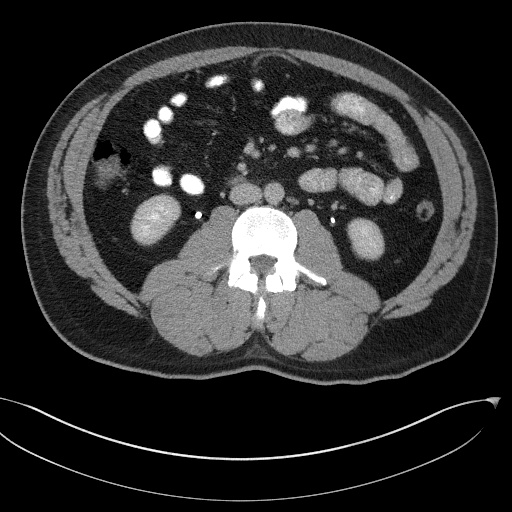
[im 29/66  soft-tissue]
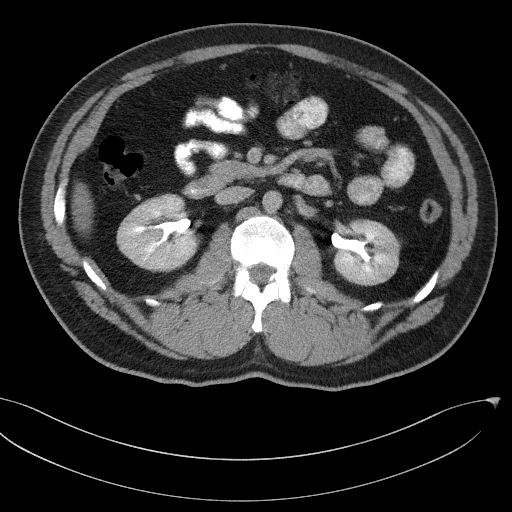
[im 37/66  soft-tissue]
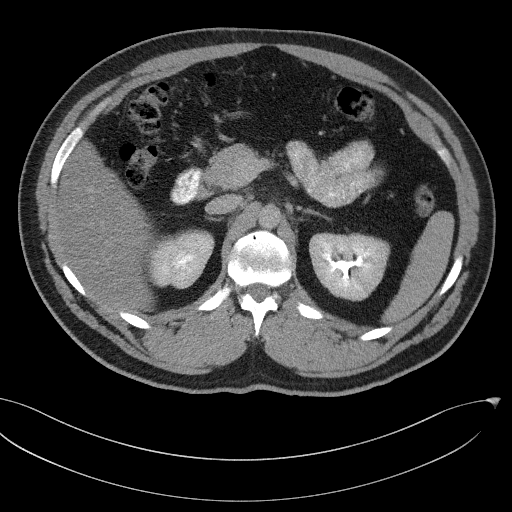
[im 37/66  lung]
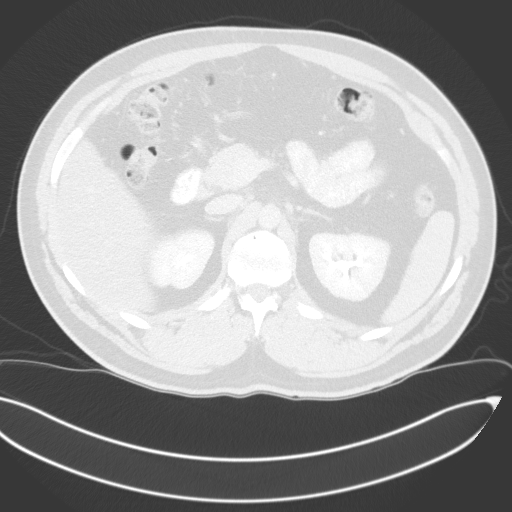
[im 44/66  soft-tissue]
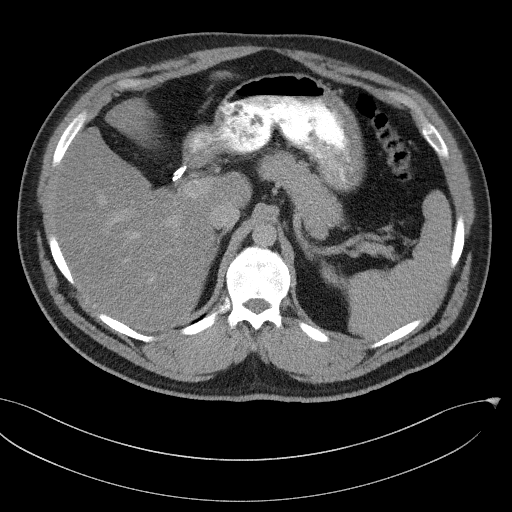
[im 44/66  lung]
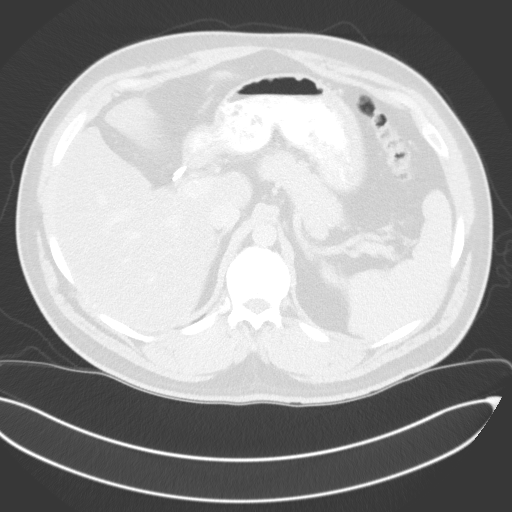
[im 51/66  soft-tissue]
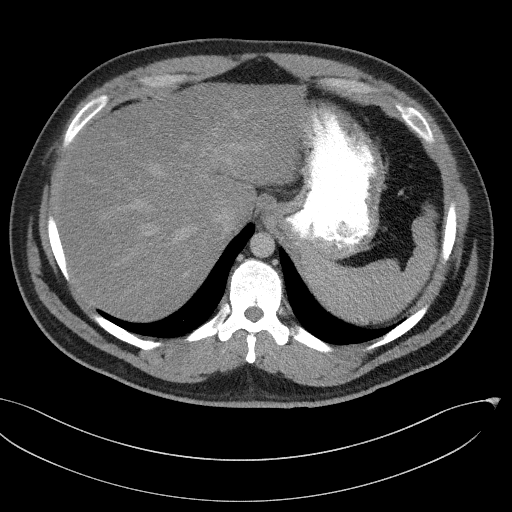
[im 51/66  lung]
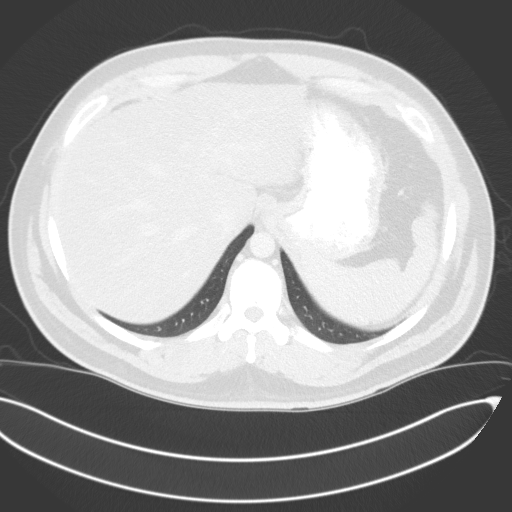
[im 58/66  soft-tissue]
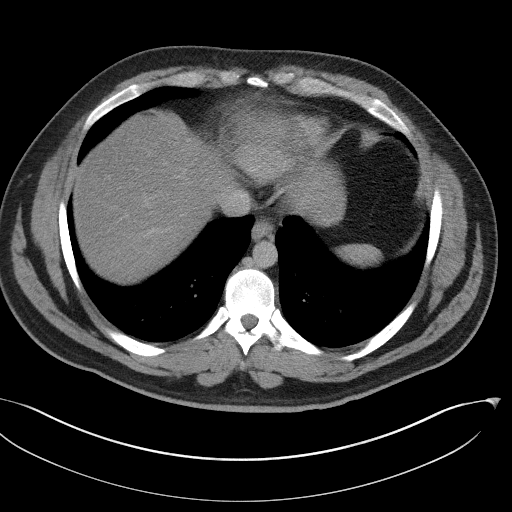
[im 58/66  lung]
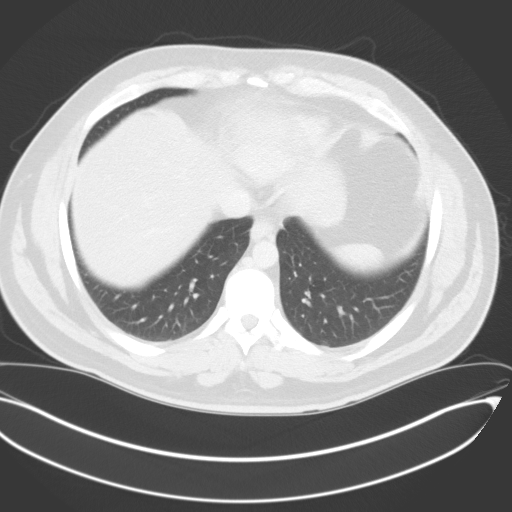

[12 of 46 positions shown; findings below may reference images not displayed]

FINDINGS: Lower chest: No acute findings.

Hepatobiliary: No hepatic masses identified. Moderate diffuse
hepatic steatosis. Prior cholecystectomy. No evidence of biliary
obstruction.

Pancreas:  No mass or inflammatory changes.

Spleen:  Within normal limits in size and appearance.

Adrenals/Urinary Tract: A few tiny calculi are seen in both kidneys
measuring 3 mm or less. No evidence of hydronephrosis. A tiny sub-cm
left renal cyst is noted. No evidence of renal mass.

Stomach/Bowel: Visualized portion unremarkable.

Vascular/Lymphatic: No pathologically enlarged lymph nodes
identified. No abdominal aortic aneurysm.

Other: Ventral abdominal wall surgical mesh is seen in the midline
just above the level of the umbilicus. A small recurrent hernia is
seen along the right lateral margin of the mesh in the paraumbilical
region, which contains only fat. No evidence of herniated bowel
loops.

Musculoskeletal:  No suspicious bone lesions identified.
IMPRESSION: 1. Ventral abdominal wall surgical mesh, with small fat-containing
hernia along the right lateral margin of the mesh in the
paraumbilical region
2. Moderate hepatic steatosis.
3. Tiny bilateral renal calculi.  No evidence of hydronephrosis.

## 2020-09-14 ENCOUNTER — Encounter: Payer: Self-pay | Admitting: Family Medicine

## 2020-11-04 DIAGNOSIS — M546 Pain in thoracic spine: Secondary | ICD-10-CM | POA: Diagnosis not present

## 2020-11-04 DIAGNOSIS — M9902 Segmental and somatic dysfunction of thoracic region: Secondary | ICD-10-CM | POA: Diagnosis not present

## 2020-11-04 DIAGNOSIS — M9901 Segmental and somatic dysfunction of cervical region: Secondary | ICD-10-CM | POA: Diagnosis not present

## 2020-11-04 DIAGNOSIS — M542 Cervicalgia: Secondary | ICD-10-CM | POA: Diagnosis not present

## 2020-11-21 DIAGNOSIS — M9902 Segmental and somatic dysfunction of thoracic region: Secondary | ICD-10-CM | POA: Diagnosis not present

## 2020-11-21 DIAGNOSIS — M542 Cervicalgia: Secondary | ICD-10-CM | POA: Diagnosis not present

## 2020-11-21 DIAGNOSIS — M9901 Segmental and somatic dysfunction of cervical region: Secondary | ICD-10-CM | POA: Diagnosis not present

## 2020-11-21 DIAGNOSIS — M546 Pain in thoracic spine: Secondary | ICD-10-CM | POA: Diagnosis not present

## 2020-12-10 ENCOUNTER — Other Ambulatory Visit: Payer: Self-pay | Admitting: Family Medicine

## 2020-12-23 ENCOUNTER — Ambulatory Visit (INDEPENDENT_AMBULATORY_CARE_PROVIDER_SITE_OTHER): Payer: BC Managed Care – PPO | Admitting: Internal Medicine

## 2020-12-25 ENCOUNTER — Other Ambulatory Visit: Payer: Self-pay

## 2020-12-25 ENCOUNTER — Telehealth (INDEPENDENT_AMBULATORY_CARE_PROVIDER_SITE_OTHER): Payer: BC Managed Care – PPO | Admitting: Gastroenterology

## 2020-12-25 ENCOUNTER — Encounter (INDEPENDENT_AMBULATORY_CARE_PROVIDER_SITE_OTHER): Payer: Self-pay | Admitting: Gastroenterology

## 2020-12-25 VITALS — Ht 71.0 in | Wt 255.0 lb

## 2020-12-25 DIAGNOSIS — K219 Gastro-esophageal reflux disease without esophagitis: Secondary | ICD-10-CM

## 2020-12-25 DIAGNOSIS — R7989 Other specified abnormal findings of blood chemistry: Secondary | ICD-10-CM | POA: Diagnosis not present

## 2020-12-25 DIAGNOSIS — K7581 Nonalcoholic steatohepatitis (NASH): Secondary | ICD-10-CM | POA: Diagnosis not present

## 2020-12-25 MED ORDER — PANTOPRAZOLE SODIUM 40 MG PO TBEC: 40.0000 mg | DELAYED_RELEASE_TABLET | Freq: Every day | ORAL | 3 refills | Status: DC

## 2020-12-25 NOTE — Progress Notes (Signed)
[name], M.D. Gastroenterology & Hepatology [facility] [address] [place], [place] [address] Primary Care Physician: Erven Colla, DO [place] [id number]  This is a telephone virtual visit.  It required patient-provider interaction for the medical decision making as documented below. The patient has consented and agreed to proceed with a Telehealth encounter given the current Coronavirus pandemic.  VIRTUAL VISIT NOTE Patient location: home Provider location: office  I will communicate my assessment and recommendations to the referring MD via EMR.  Problems 1. GERD 2.  Elevated liver function tests 3. NASH  History of Present Illness: [name] is a 35 y.o. male with past medical history of GERD and reflux esophagitis, colon polyps, gout, possible NASH, who presents for follow-up of GERD.  Patient was last seen in clinic on 11/01/2019.  He had presented some episodes of rectal bleeding that were self-limited and he deferred performing a colonoscopy at that time.  Was also counseled to implement healthy lifestyle changes due to fatty liver on imaging with elevated liver enzymes.  Due to dysphagia, an EGD was performed which reported:  12/10/2019 - Normal esophagus. - Z-line irregular, 38 cm from the incisors. - 2 cm hiatal hernia. - No endoscopic esophageal abnormality to explain patient's dysphagia. Esophagus dilated.  Biopsied neg for EoE. - Multiple gastric polyps. Biopsied - consistent with fundic gland polyps. - Nodular mucosa in the prepyloric region of the stomach. Biopsied, neg for HP. - Normal duodenal bulb and second portion of the duodenum.  Patient states feeling well and reports being compliant with the medication.  He takes Pantoprazole 40 mg it once a night and feels it is better controlled than in the past but he is still having heartburn episodes.  This does not happen every day  but the patient reports that he has taken sometimes two pantoprazole pills during the night when this recurs, on average 7-8 times a month.  By taking 2 pills he has felt complete improvement of his symptoms. The patient denies having any nausea, vomiting, fever, chills, hematochezia, melena, hematemesis, abdominal distention, abdominal pain, diarrhea, jaundice, pruritus or weight loss.  The patient reports that he was told he had a pancreatic lesion in the past that required repeat imaging.  He has had CT abdomen with IV contrast since 2017 which not show any alterations of the pancreas.  Review of his most recent blood testing from 09/03/2020 showed elevated aminotransferases, AST 68, ALT 96, other liver enzymes were normal with bilirubin 0.9, albumin 4.7 and alkaline phosphatase 84.  Normal CBC with although cell count 7.8, platelets 232 and hemoglobin 15.8.  Last Colonoscopy: 12/2016 - normal colonoscopy  Denies any alcohol intake.  Past Medical History: Past Medical History:  Diagnosis Date  . Acid reflux   . Arthritis   . Elevated LFTs    mildly elevated transaminases in past, now normalized   . GERD (gastroesophageal reflux disease)   . Gout   . History of kidney stones   . Hypertension     Past Surgical History: Past Surgical History:  Procedure Laterality Date  . APPENDECTOMY  01/27/2015  . BIOPSY  12/10/2019   Procedure: BIOPSY;  Surgeon: Rogene Houston, MD;  [facility];  Service: Endoscopy;;  esophagus gastric  . CHOLECYSTECTOMY    . COLONOSCOPY WITH ESOPHAGOGASTRODUODENOSCOPY (EGD) N/A 02/25/2014   NL TI, 2 SIMPLE ADENOMAS(1:>1 CM), LGE IH-FG POLYPS, PROMINENT AMPULLA. Needs Colonoscopy surveillance in 2018  .  ESOPHAGEAL DILATION N/A 08/30/2018   Procedure: ESOPHAGEAL DILATION;  Surgeon: Rogene Houston, MD;  [facility];  Service: Endoscopy;  Laterality: N/A;  . ESOPHAGEAL DILATION N/A 12/10/2019   Procedure: ESOPHAGEAL DILATION;  Surgeon:  Rogene Houston, MD;  [facility];  Service: Endoscopy;  Laterality: N/A;  . ESOPHAGOGASTRODUODENOSCOPY N/A 04/29/2015   Dr. Oneida Alar: 1. mild non-erosive gastritis (inflammation) was found in the gastric antrum. 2. Prominent Ampullla 23mmx 66mm. benign path with pyloric metaplasia  . ESOPHAGOGASTRODUODENOSCOPY N/A 07/12/2016   Procedure: ESOPHAGOGASTRODUODENOSCOPY (EGD);  Surgeon: Danie Binder, MD;  [facility];  Service: Endoscopy;  Laterality: N/A;  830  . ESOPHAGOGASTRODUODENOSCOPY N/A 08/30/2018   Procedure: ESOPHAGOGASTRODUODENOSCOPY (EGD);  Surgeon: Rogene Houston, MD;  [facility];  Service: Endoscopy;  Laterality: N/A;  2:00  . ESOPHAGOGASTRODUODENOSCOPY (EGD) WITH PROPOFOL N/A 12/10/2019   Procedure: ESOPHAGOGASTRODUODENOSCOPY (EGD) WITH PROPOFOL;  Surgeon: Rogene Houston, MD;  [facility];  Service: Endoscopy;  Laterality: N/A;  12:55  . EUS N/A 03/20/2014   Dr. Paulita Fujita: prominent major papilla s/p biopsy, minor papilla without clear adenomatous or mass-like appearance, chronic duodenitis on path.   Fatima Blank HERNIA REPAIR N/A 05/02/2019   Procedure: HERNIA REPAIR OPEN INCISIONAL WITH MESH;  Surgeon: Virl Cagey, MD;  Location: [facility];  Service: General;  Laterality: N/A;  . KIDNEY STONE SURGERY  age 43 and age 67   lithotripsy, stent  . POLYPECTOMY  12/10/2019   Procedure: POLYPECTOMY;  Surgeon: Rogene Houston, MD;  [facility];  Service: Endoscopy;;  gastric  . SAVORY DILATION N/A 07/12/2016   Procedure: SAVORY DILATION;  Surgeon: Danie Binder, MD;  [facility];  Service: Endoscopy;  Laterality: N/A;  . VENTRAL HERNIA REPAIR N/A 11/28/2019   Procedure: LAPAROSCOPIC VENTRAL HERNIA WITH MESH;  Surgeon: Virl Cagey, MD;  Location: [facility];  Service: General;  Laterality: N/A;  . WISDOM TOOTH EXTRACTION      Family History: Family History  Problem Relation Age of Onset  . Diabetes Father   .  COPD Father   . Colon cancer Neg Hx        does not know paternal side    Social History: Social History   Tobacco Use  Smoking Status Never Smoker  Smokeless Tobacco Never Used   Social History   Substance and Sexual Activity  Alcohol Use No   Social History   Substance and Sexual Activity  Drug Use No    Allergies: Allergies  Allergen Reactions  . Phenergan [Promethazine Hcl] Other (See Comments)    arn swelled and hurt for 3 days, pt would rather not have    Medications: Current Outpatient Medications  Medication Sig Dispense Refill  . allopurinol (ZYLOPRIM) 300 MG tablet Take 1 tablet (300 mg total) by mouth 2 (two) times daily. 60 tablet 5  . amLODipine (NORVASC) 5 MG tablet Take 1 tablet (5 mg total) by mouth at bedtime. 30 tablet 5  . Colchicine (MITIGARE) 0.6 MG CAPS One tid prn gout till relief 30 capsule 2  . pantoprazole (PROTONIX) 40 MG tablet Take 1 tablet (40 mg total) by mouth 2 (two) times daily. 60 tablet 1   No current facility-administered medications for this visit.    Review of Systems: GENERAL: negative for malaise, night sweats HEENT: No changes in hearing or vision, no nose bleeds or other nasal problems. NECK: Negative for lumps, goiter, pain and significant neck  swelling RESPIRATORY: Negative for cough, wheezing CARDIOVASCULAR: Negative for chest pain, leg swelling, palpitations, orthopnea GI: SEE HPI MUSCULOSKELETAL: Negative for joint pain or swelling, back pain, and muscle pain. SKIN: Negative for lesions, rash PSYCH: Negative for sleep disturbance, mood disorder and recent psychosocial stressors. HEMATOLOGY Negative for prolonged bleeding, bruising easily, and swollen nodes. ENDOCRINE: Negative for cold or heat intolerance, polyuria, polydipsia and goiter. NEURO: negative for tremor, gait imbalance, syncope and seizures. The remainder of the review of systems is noncontributory.   Physical Exam: No exam was performed as this was  a telephone encounter  Imaging/Labs: as above  I personally reviewed and interpreted the available labs, imaging and endoscopic files.  Impression and Plan: DERAL CHRISTIANS is a 35 y.o. male with past medical history of GERD and reflux esophagitis, colon polyps, gout, possible NASH, who presents for follow-up of GERD.  The patient has presented improvement of his symptoms while taking pantoprazole, but this is not completely controlled as he has needed to take the medication twice a night intermittently.  However, the patient is taking the medication at night.  I informed him of the correct way to take it fasting first in the morning and waiting 30 to 40 minutes to have some breakfast.  If he has episodes of heartburn in between, he can take or 40 mg of pantoprazole at night.  Patient understood and agreed.  Has not presented any red flag signs and had a relatively recent EGD without any major findings.  On the other hand, the patient has presented elevation of his aminotransferases.  This is likely related to NASH given the findings and imaging consistent with fatty liver.  However we will rule out other viral, autoimmune and metabolic causes leading to this.  His NAFLD score is -2.4, low risk for advanced fibrosis no need for further imaging.  I recommend the patient to implement the Mediterranean diet to lose weight.  - Continue pantoprazole 40 mg every morning, can take a dose at night if symptoms recur - Explained presumed etiology of reflux symptoms. Instruction provided in the use of antireflux medication - patient should take medication in the morning 30-45 minutes before eating breakfast. Discussed avoidance of eating within 2 hours of lying down to sleep and benefit of blocks to elevate head of bed. - Check LFTs, hepatitis A/B/C serologies, iron panel, ANA, ASMA, IgG, ceruloplasmin, A1AT - Explained to the patient the etiology and consequences of his current liver disease. Patient was  counseled about the benefit of implementing a Mediterranean diet and exercise plan to decrease at least 5% of weight. The patient understood about the importance of lifestyle changes to potentially reverse his liver involvement.  All questions were answered.      Total visit time: I spent a total of  30 minutes  Dustin Peppers, MD Gastroenterology and Hepatology [facility]

## 2020-12-25 NOTE — Patient Instructions (Addendum)
Continue pantoprazole 40 mg every morning, can take a dose at night if symptoms recur Explained presumed etiology of reflux symptoms. Instruction provided in the use of antireflux medication - patient should take medication in the morning 30-45 minutes before eating breakfast. Discussed avoidance of eating within 2 hours of lying down to sleep and benefit of blocks to elevate head of bed. Explained to the patient the etiology and consequences of his current liver disease. Patient was counseled about the benefit of implementing a Mediterranean diet and exercise plan to decrease at least 5% of weight. The patient understood about the importance of lifestyle changes to potentially reverse his liver involvement. Perform blood workup

## 2021-02-11 DIAGNOSIS — B9689 Other specified bacterial agents as the cause of diseases classified elsewhere: Secondary | ICD-10-CM | POA: Diagnosis not present

## 2021-02-11 DIAGNOSIS — L0202 Furuncle of face: Secondary | ICD-10-CM | POA: Diagnosis not present

## 2021-02-11 DIAGNOSIS — L918 Other hypertrophic disorders of the skin: Secondary | ICD-10-CM | POA: Diagnosis not present

## 2021-02-11 DIAGNOSIS — L700 Acne vulgaris: Secondary | ICD-10-CM | POA: Diagnosis not present

## 2021-03-14 ENCOUNTER — Other Ambulatory Visit: Payer: Self-pay | Admitting: Family Medicine

## 2021-03-14 DIAGNOSIS — I10 Essential (primary) hypertension: Secondary | ICD-10-CM

## 2021-03-16 NOTE — Telephone Encounter (Signed)
Please contact patient to have him schedule follow up; then may route to nurses. Thank you

## 2021-03-16 NOTE — Telephone Encounter (Signed)
Sent my chart message to schedule appointment.

## 2021-03-19 NOTE — Telephone Encounter (Signed)
Patient schedule medication follow up 4/21

## 2021-03-19 NOTE — Telephone Encounter (Signed)
Two of the same prescription

## 2021-03-25 ENCOUNTER — Other Ambulatory Visit: Payer: Self-pay | Admitting: *Deleted

## 2021-03-25 DIAGNOSIS — I10 Essential (primary) hypertension: Secondary | ICD-10-CM

## 2021-03-25 DIAGNOSIS — E785 Hyperlipidemia, unspecified: Secondary | ICD-10-CM

## 2021-03-31 DIAGNOSIS — E785 Hyperlipidemia, unspecified: Secondary | ICD-10-CM | POA: Diagnosis not present

## 2021-03-31 DIAGNOSIS — I10 Essential (primary) hypertension: Secondary | ICD-10-CM | POA: Diagnosis not present

## 2021-04-01 LAB — COMPREHENSIVE METABOLIC PANEL
ALT: 64 IU/L — ABNORMAL HIGH (ref 0–44)
AST: 38 IU/L (ref 0–40)
Albumin/Globulin Ratio: 1.7 (ref 1.2–2.2)
Albumin: 4.6 g/dL (ref 4.0–5.0)
Alkaline Phosphatase: 85 IU/L (ref 44–121)
BUN/Creatinine Ratio: 11 (ref 9–20)
BUN: 13 mg/dL (ref 6–20)
Bilirubin Total: 0.6 mg/dL (ref 0.0–1.2)
CO2: 23 mmol/L (ref 20–29)
Calcium: 9.4 mg/dL (ref 8.7–10.2)
Chloride: 102 mmol/L (ref 96–106)
Creatinine, Ser: 1.16 mg/dL (ref 0.76–1.27)
Globulin, Total: 2.7 g/dL (ref 1.5–4.5)
Glucose: 100 mg/dL — ABNORMAL HIGH (ref 65–99)
Potassium: 4.1 mmol/L (ref 3.5–5.2)
Sodium: 140 mmol/L (ref 134–144)
Total Protein: 7.3 g/dL (ref 6.0–8.5)
eGFR: 85 mL/min/{1.73_m2} (ref >59)

## 2021-04-01 LAB — LIPID PANEL
Chol/HDL Ratio: 5.2 ratio — ABNORMAL HIGH (ref 0.0–5.0)
Cholesterol, Total: 178 mg/dL (ref 100–199)
HDL: 34 mg/dL — ABNORMAL LOW (ref >39)
LDL Chol Calc (NIH): 118 mg/dL — ABNORMAL HIGH (ref 0–99)
Triglycerides: 144 mg/dL (ref 0–149)
VLDL Cholesterol Cal: 26 mg/dL (ref 5–40)

## 2021-04-02 ENCOUNTER — Other Ambulatory Visit: Payer: Self-pay

## 2021-04-02 ENCOUNTER — Encounter: Payer: Self-pay | Admitting: Family Medicine

## 2021-04-02 ENCOUNTER — Ambulatory Visit (INDEPENDENT_AMBULATORY_CARE_PROVIDER_SITE_OTHER): Payer: BC Managed Care – PPO | Admitting: Family Medicine

## 2021-04-02 VITALS — BP 130/82 | HR 90 | Temp 98.4°F | Wt 263.4 lb

## 2021-04-02 DIAGNOSIS — I10 Essential (primary) hypertension: Secondary | ICD-10-CM | POA: Diagnosis not present

## 2021-04-02 MED ORDER — AMLODIPINE BESYLATE 5 MG PO TABS: 1.0000 | ORAL_TABLET | Freq: Every day | ORAL | 1 refills | Status: DC

## 2021-04-02 NOTE — Patient Instructions (Signed)

## 2021-04-02 NOTE — Progress Notes (Signed)
Patient ID: [name], male    DOB: 03-Sep-1986, 35 y.o.   MRN: [id number]   Chief Complaint  Patient presents with  . Hypertension    Follow up   Subjective:  CC: medication management for HTN   Presents today for medication management for hypertension.  Reports that he takes his medications as prescribed, compliant, takes blood pressures at home, does not keep a log of these numbers.  Labs done on April 19, will review in detail at this visit.  Has a history of fatty liver, previous ultrasounds have been done in the past.  Denies alcohol intake, working on healthy diet.  Denies fever, chills, chest pain, shortness of breath.  Endorses right lower extremity edema, worse as the day goes on, resolves, not bothersome.    Medical History [name] has a past medical history of Acid reflux, Arthritis, Elevated LFTs, GERD (gastroesophageal reflux disease), Gout, History of kidney stones, and Hypertension.   Outpatient Encounter Medications as of 04/02/2021  Medication Sig  . allopurinol (ZYLOPRIM) 300 MG tablet Take 1 tablet (300 mg total) by mouth 2 (two) times daily.  Marland Kitchen amLODipine (NORVASC) 5 MG tablet Take 1 tablet (5 mg total) by mouth at bedtime.  . Colchicine (MITIGARE) 0.6 MG CAPS One tid prn gout till relief  . pantoprazole (PROTONIX) 40 MG tablet Take 1 tablet (40 mg total) by mouth daily.  . [DISCONTINUED] amLODipine (NORVASC) 5 MG tablet TAKE ONE TABLET BY MOUTH AT BEDTIME   No facility-administered encounter medications on file as of 04/02/2021.     Review of Systems  Eyes: Negative for visual disturbance.  Respiratory: Negative for shortness of breath.   Cardiovascular: Positive for leg swelling. Negative for chest pain and palpitations.       Right ankle edema gets worse as day goes on, resolves over night.   Neurological: Negative for dizziness, light-headedness and headaches.     Vitals BP 130/82   Pulse 90   Temp 98.4 F (36.9 C) (Oral)   Wt 263 lb 6.4 oz  (119.5 kg)   SpO2 99%   BMI 36.74 kg/m   Objective:   Physical Exam Vitals and nursing note reviewed.  Cardiovascular:     Rate and Rhythm: Normal rate and regular rhythm.     Heart sounds: Normal heart sounds.  Pulmonary:     Effort: Pulmonary effort is normal.     Breath sounds: Normal breath sounds.  Musculoskeletal:     Comments: Right ankle edema worse as day goes on.   Skin:    General: Skin is warm and dry.  Neurological:     General: No focal deficit present.     Mental Status: He is alert.  Psychiatric:        Behavior: Behavior normal.      Assessment and Plan   1. Essential hypertension - amLODipine (NORVASC) 5 MG tablet; Take 1 tablet (5 mg total) by mouth at bedtime.  Dispense: 90 tablet; Refill: 1   Blood pressure well-controlled. Ankle edema not bothersome and will continue with current medication.    Hypertension Medication compliance: takes daily as prescribed.  Denies chest pain, shortness of breath, lower extremity edema (has right ankle edema), vision changes, headaches.  Pertinent lab work: last labs 03/31/21: reviewed in detail at visit. Kidney normal; ALT 64 (down from prev 96)- has history of fatty liver.  ASCVD 10- year risk score: < 32 years of age Monitoring: every 6 months,  Side effects: slight right ankle  edema (worse as day progresses, then resolves).  Continue current medication regimen: continue current regimen.  Agrees with plan of care discussed today. Understands warning signs to seek further care: chest pain, shortness of breath, any significant change in health.  Understands to follow-up in 6 with labs prior to visit.  Recommend to continue checking blood pressures several times per week, keep a blood pressure log, goal is less than 130/80.  Working on lifestyle modification, healthy diet.  Pecolia Ades, NP 04/02/21

## 2021-04-13 ENCOUNTER — Other Ambulatory Visit: Payer: Self-pay | Admitting: Family Medicine

## 2021-04-13 DIAGNOSIS — M1009 Idiopathic gout, multiple sites: Secondary | ICD-10-CM

## 2021-06-17 NOTE — Telephone Encounter (Signed)
Appt made for tomorrow. Advised pt if pain is severe he should go to ED.

## 2021-06-18 ENCOUNTER — Ambulatory Visit (INDEPENDENT_AMBULATORY_CARE_PROVIDER_SITE_OTHER): Payer: BC Managed Care – PPO | Admitting: Family Medicine

## 2021-06-18 ENCOUNTER — Other Ambulatory Visit: Payer: Self-pay

## 2021-06-18 ENCOUNTER — Encounter: Payer: Self-pay | Admitting: Family Medicine

## 2021-06-18 VITALS — BP 130/85 | HR 71 | Temp 98.4°F | Ht 71.0 in | Wt 260.0 lb

## 2021-06-18 DIAGNOSIS — K429 Umbilical hernia without obstruction or gangrene: Secondary | ICD-10-CM | POA: Diagnosis not present

## 2021-06-18 NOTE — Progress Notes (Signed)
   Subjective:    Patient ID: [name], male    DOB: [birth year], 35 y.o.   MRN: [id number]  HPI Abdominal Hernia. Has had two surgeries to repair and started having pain about 2 months. Would like referral to someone in [place].   Patient surgery with Dr. Constance Haw previously would like to get a second opinion  Review of Systems     Objective:   Physical Exam  Has what appears to be hernia but hard to tell if this is just fat or if it is true omentum versus intestine I doubt intestinal      Assessment & Plan:  Hernia Referral to surgery for second opinion Patient requesting [facility] at [facility] Warning signs regarding strangulated hernia or red flags was discussed

## 2021-06-24 ENCOUNTER — Telehealth: Payer: Self-pay | Admitting: Family Medicine

## 2021-06-24 DIAGNOSIS — K429 Umbilical hernia without obstruction or gangrene: Secondary | ICD-10-CM

## 2021-06-24 NOTE — Telephone Encounter (Signed)
Patient  referral was sent to wrong place he wanting at [facility] in [place].He states didn't have a good experience at [facility] and requesting [facility] this time

## 2021-06-25 NOTE — Telephone Encounter (Signed)
Referral ordered in [facility]. 

## 2021-06-25 NOTE — Telephone Encounter (Signed)
Yes, pls give referral to surgeon he is requesting. Thx. Dr. Lovena Le

## 2021-07-13 DIAGNOSIS — R1084 Generalized abdominal pain: Secondary | ICD-10-CM | POA: Diagnosis not present

## 2021-07-13 DIAGNOSIS — Z6835 Body mass index (BMI) 35.0-35.9, adult: Secondary | ICD-10-CM | POA: Diagnosis not present

## 2021-07-13 DIAGNOSIS — K432 Incisional hernia without obstruction or gangrene: Secondary | ICD-10-CM | POA: Diagnosis not present

## 2021-07-14 ENCOUNTER — Other Ambulatory Visit: Payer: Self-pay

## 2021-07-14 ENCOUNTER — Ambulatory Visit (INDEPENDENT_AMBULATORY_CARE_PROVIDER_SITE_OTHER): Payer: BC Managed Care – PPO | Admitting: Internal Medicine

## 2021-07-14 ENCOUNTER — Encounter (INDEPENDENT_AMBULATORY_CARE_PROVIDER_SITE_OTHER): Payer: Self-pay | Admitting: Internal Medicine

## 2021-07-14 VITALS — BP 142/85 | HR 96 | Temp 98.8°F | Ht 71.0 in | Wt 248.0 lb

## 2021-07-14 DIAGNOSIS — K529 Noninfective gastroenteritis and colitis, unspecified: Secondary | ICD-10-CM

## 2021-07-14 DIAGNOSIS — K7581 Nonalcoholic steatohepatitis (NASH): Secondary | ICD-10-CM

## 2021-07-14 MED ORDER — CIPROFLOXACIN HCL 500 MG PO TABS: 500.0000 mg | ORAL_TABLET | Freq: Two times a day (BID) | ORAL | 0 refills | Status: DC

## 2021-07-14 MED ORDER — ONDANSETRON HCL 4 MG PO TABS: 4.0000 mg | ORAL_TABLET | Freq: Three times a day (TID) | ORAL | 0 refills | Status: DC | PRN

## 2021-07-14 MED ORDER — METRONIDAZOLE 250 MG PO TABS: 250.0000 mg | ORAL_TABLET | Freq: Three times a day (TID) | ORAL | 0 refills | Status: DC

## 2021-07-14 NOTE — Progress Notes (Signed)
Presenting complaint;  Nausea vomiting and diarrhea  Database and subjective:  Patient is 35 year old Caucasian male who has a history of elevated transaminases secondary to fatty liver as well as chronic GERD who was last seen in January 2022. Patient returns with new complaints. He states he was feeling fine on the evening of 07/11/2020.  He and his wife along with 3 children had a meal at a restaurant.  He woke up around 3 AM on 07/12/2020 with nausea vomiting and diarrhea.  He had multiple episodes of vomiting as well as diarrhea.  He states heaving and retching was quite violent.  He did notice small amount of blood.  His stools have been like [facility].  He states he has had approximately 50 stools since his symptoms began.  He has lost 10 pounds in the last 2-1/2 days.  He did not experience fever or rectal bleeding.  He also has been experiencing frequent burping and nausea even with liquids.  No history of recent antibiotic use.  He drinks city water but he has an extra filter placed. He does not do any scheduled physical activity but he states he walks more than tends thousand steps a day. His wife or children did not experience any symptoms. He has not had skin rash or breathing difficulty or joint pains. Patient states he saw Dr. Ladona Horns last week for second opinion regarding recurrent ventral hernia.  He states he has had surgery twice by Dr. Constance Haw and he feels hernia is back.  He had CT at [facility] and she felt the defect is due to mesh and not a recurrent hernia. Patient states he is scheduled for another CT next week.   Current Medications: Outpatient Encounter Medications as of 07/14/2021  Medication Sig   allopurinol (ZYLOPRIM) 300 MG tablet TAKE ONE TABLET BY MOUTH TWICE DAILY   amLODipine (NORVASC) 5 MG tablet Take 1 tablet (5 mg total) by mouth at bedtime.   Colchicine (MITIGARE) 0.6 MG CAPS One tid prn gout till relief   pantoprazole (PROTONIX) 40 MG tablet Take 1 tablet (40 mg  total) by mouth daily.   No facility-administered encounter medications on file as of 07/14/2021.     Objective: Blood pressure (!) 142/85, pulse 96, temperature 98.8 F (37.1 C), temperature source Oral, height _0  (1.803 m), weight 248 lb (112.5 kg). Patient is alert and in no acute distress. Conjunctiva is pink. Sclera is nonicteric Oropharyngeal mucosa is normal. No neck masses or thyromegaly noted. Cardiac exam with regular rhythm normal S1 and S2. No murmur or gallop noted. Lungs are clear to auscultation. Abdomen is full.  He has small supraumbilical scar.  Bowel sounds are normal.  Cough impulse appears to be present.  On palpation abdomen is soft.  He has mild tenderness in epigastric region.  No organomegaly or masses. No LE edema or clubbing noted.  Labs/studies Results:   CBC Latest Ref Rng & Units 09/03/2020 11/06/2019 07/12/2016  WBC 3.4 - 10.8 x10E3/uL 7.8 6.4 5.8  Hemoglobin 13.0 - 17.7 g/dL 15.8 15.5 15.4  Hematocrit 37.5 - 51.0 % 44.0 45.2 45.1  Platelets 150 - 450 x10E3/uL 232 246 220    CMP Latest Ref Rng & Units 03/31/2021 09/03/2020 05/30/2020  Glucose 65 - 99 mg/dL 100(H) 84 -  BUN 6 - 20 mg/dL 13 12 -  Creatinine 0.76 - 1.27 mg/dL 1.16 1.19 1.40(H)  Sodium 134 - 144 mmol/L 140 141 -  Potassium 3.5 - 5.2 mmol/L 4.1 4.0 -  Chloride 96 - 106 mmol/L 102 103 -  CO2 20 - 29 mmol/L 23 26 -  Calcium 8.7 - 10.2 mg/dL 9.4 9.6 -  Total Protein 6.0 - 8.5 g/dL 7.3 7.6 -  Total Bilirubin 0.0 - 1.2 mg/dL 0.6 0.9 -  Alkaline Phos 44 - 121 IU/L 85 84 -  AST 0 - 40 IU/L 38 68(H) -  ALT 0 - 44 IU/L 64(H) 96(H) -    Hepatic Function Latest Ref Rng & Units 03/31/2021 09/03/2020 11/06/2019  Total Protein 6.0 - 8.5 g/dL 7.3 7.6 7.3  Albumin 4.0 - 5.0 g/dL 4.6 4.7 -  AST 0 - 40 IU/L 38 68(H) 34  ALT 0 - 44 IU/L 64(H) 96(H) 69(H)  Alk Phosphatase 44 - 121 IU/L 85 84 -  Total Bilirubin 0.0 - 1.2 mg/dL 0.6 0.9 0.5  Bilirubin, Direct 0.00 - 0.40 mg/dL - - -       Assessment:  #1.  Acute symptom complex consisting of nausea vomiting and nonbloody diarrhea most likely due to an infection.  He has not improved spontaneously.  Therefore will need further work-up.  #2.  Hematemesis.  It appears he had hematemesis secondary to Mallory-Weiss tear as vomiting episodes were quite violent.  EGD in December 2020 for dysphagia and GERD did not reveal peptic ulcer disease or H. pylori infection.  #3.  Elevated transaminases.  He has a history of fatty liver.  He is due for follow-up labs.   Plan:  Full liquid diet for the next 48 hours. Patient advised to drink plenty of fluids. If patient cannot keep liquids down he should go to emergency room for further evaluation. Ondansetron 4 mg p.o. 3 times daily as needed. CBC with differential and comprehensive chemistry panel. Stool sample for GI pathogen panel. Patient will start Cipro and metronidazole as soon as he is provided stool sample to the lab. Cipro dose is  500 mg by mouth twice daily for 1 week. Metronidazole dose is to 50 mg by mouth 3 times a day for 1 week. Office visit in 1 year unless acute symptoms linger on.

## 2021-07-14 NOTE — Patient Instructions (Signed)
Full liquids for the next 48 hours. Please drink plenty of fluids. Begin antibiotic as soon as you provide a stool sample to the lab. Physician will call with results of blood test and stool studies.

## 2021-07-15 ENCOUNTER — Ambulatory Visit (HOSPITAL_COMMUNITY)
Admission: RE | Admit: 2021-07-15 | Discharge: 2021-07-15 | Disposition: A | Discharge status: Home / Self Care | Payer: BC Managed Care – PPO | Source: Ambulatory Visit | Attending: Urology | Admitting: Urology

## 2021-07-15 DIAGNOSIS — N200 Calculus of kidney: Secondary | ICD-10-CM | POA: Diagnosis not present

## 2021-07-15 DIAGNOSIS — K760 Fatty (change of) liver, not elsewhere classified: Secondary | ICD-10-CM | POA: Diagnosis not present

## 2021-07-17 LAB — CBC WITH DIFFERENTIAL/PLATELET
Absolute Monocytes: 822 cells/uL (ref 200–950)
Basophils Absolute: 30 cells/uL (ref 0–200)
Basophils Relative: 0.3 %
Eosinophils Absolute: 287 cells/uL (ref 15–500)
Eosinophils Relative: 2.9 %
HCT: 50.3 % — ABNORMAL HIGH (ref 38.5–50.0)
Hemoglobin: 17.5 g/dL — ABNORMAL HIGH (ref 13.2–17.1)
Lymphs Abs: 1812 cells/uL (ref 850–3900)
MCH: 30.8 pg (ref 27.0–33.0)
MCHC: 34.8 g/dL (ref 32.0–36.0)
MCV: 88.4 fL (ref 80.0–100.0)
MPV: 9 fL (ref 7.5–12.5)
Monocytes Relative: 8.3 %
Neutro Abs: 6950 cells/uL (ref 1500–7800)
Neutrophils Relative %: 70.2 %
Platelets: 308 10*3/uL (ref 140–400)
RBC: 5.69 10*6/uL (ref 4.20–5.80)
RDW: 13.1 % (ref 11.0–15.0)
Total Lymphocyte: 18.3 %
WBC: 9.9 10*3/uL (ref 3.8–10.8)

## 2021-07-17 LAB — COMPREHENSIVE METABOLIC PANEL
AG Ratio: 1.6 (calc) (ref 1.0–2.5)
ALT: 87 U/L — ABNORMAL HIGH (ref 9–46)
AST: 50 U/L — ABNORMAL HIGH (ref 10–40)
Albumin: 5.2 g/dL — ABNORMAL HIGH (ref 3.6–5.1)
Alkaline phosphatase (APISO): 77 U/L (ref 36–130)
BUN/Creatinine Ratio: 12 (calc) (ref 6–22)
BUN: 16 mg/dL (ref 7–25)
CO2: 25 mmol/L (ref 20–32)
Calcium: 10 mg/dL (ref 8.6–10.3)
Chloride: 102 mmol/L (ref 98–110)
Creat: 1.29 mg/dL — ABNORMAL HIGH (ref 0.60–1.26)
Globulin: 3.2 g/dL (calc) (ref 1.9–3.7)
Glucose, Bld: 97 mg/dL (ref 65–99)
Potassium: 3.7 mmol/L (ref 3.5–5.3)
Sodium: 140 mmol/L (ref 135–146)
Total Bilirubin: 1 mg/dL (ref 0.2–1.2)
Total Protein: 8.4 g/dL — ABNORMAL HIGH (ref 6.1–8.1)

## 2021-07-17 LAB — GASTROINTESTINAL PATHOGEN PANEL PCR
C. difficile Tox A/B, PCR: NOT DETECTED
Campylobacter, PCR: NOT DETECTED
Cryptosporidium, PCR: NOT DETECTED
E coli (ETEC) LT/ST PCR: NOT DETECTED
E coli (STEC) stx1/stx2, PCR: NOT DETECTED
E coli 0157, PCR: NOT DETECTED
Giardia lamblia, PCR: NOT DETECTED
Norovirus, PCR: NOT DETECTED
Rotavirus A, PCR: NOT DETECTED
Salmonella, PCR: NOT DETECTED
Shigella, PCR: NOT DETECTED

## 2021-07-21 DIAGNOSIS — K760 Fatty (change of) liver, not elsewhere classified: Secondary | ICD-10-CM | POA: Diagnosis not present

## 2021-07-21 DIAGNOSIS — K432 Incisional hernia without obstruction or gangrene: Secondary | ICD-10-CM | POA: Diagnosis not present

## 2021-07-21 DIAGNOSIS — R1084 Generalized abdominal pain: Secondary | ICD-10-CM | POA: Diagnosis not present

## 2021-07-21 DIAGNOSIS — R109 Unspecified abdominal pain: Secondary | ICD-10-CM | POA: Diagnosis not present

## 2021-07-22 ENCOUNTER — Encounter: Payer: Self-pay | Admitting: Urology

## 2021-07-22 ENCOUNTER — Telehealth (INDEPENDENT_AMBULATORY_CARE_PROVIDER_SITE_OTHER): Payer: BC Managed Care – PPO | Admitting: Urology

## 2021-07-22 ENCOUNTER — Other Ambulatory Visit: Payer: Self-pay

## 2021-07-22 DIAGNOSIS — N200 Calculus of kidney: Secondary | ICD-10-CM | POA: Diagnosis not present

## 2021-07-22 NOTE — Patient Instructions (Signed)
Textbook of Natural Medicine (5th ed., pp. 1518-1527.e3). [place], [place]: Elsevier.">  Dietary Guidelines to Help Prevent Kidney Stones Kidney stones are deposits of minerals and salts that form inside your kidneys. Your risk of developing kidney stones may be greater depending on your diet, your lifestyle, the medicines you take, and whether you have certain medical conditions. Most people can lower their chances of developing kidney stones by following the instructions below. Your dietitian may give you more specific instructions depending on your overall health and the type of kidney stones youtend to develop. What are tips for following this plan? Reading food labels  Choose foods with "no salt added" or "low-salt" labels. Limit your salt (sodium) intake to less than 1,500 mg a day. Choose foods with calcium for each meal and snack. Try to eat about 300 mg of calcium at each meal. Foods that contain 200-500 mg of calcium a serving include: 8 oz (237 mL) of milk, calcium-fortifiednon-dairy milk, and calcium-fortifiedfruit juice. Calcium-fortified means that calcium has been added to these drinks. 8 oz (237 mL) of kefir, yogurt, and soy yogurt. 4 oz (114 g) of tofu. 1 oz (28 g) of cheese. 1 cup (150 g) of dried figs. 1 cup (91 g) of cooked broccoli. One 3 oz (85 g) can of sardines or mackerel. Most people need 1,000-1,500 mg of calcium a day. Talk to your dietitian abouthow much calcium is recommended for you. Shopping Buy plenty of fresh fruits and vegetables. Most people do not need to avoid fruits and vegetables, even if these foods contain nutrients that may contribute to kidney stones. When shopping for convenience foods, choose: Whole pieces of fruit. Pre-made salads with dressing on the side. Low-fat fruit and yogurt smoothies. Avoid buying frozen meals or prepared deli foods. These can be high in sodium. Look for foods with live cultures, such as yogurt and kefir. Choose high-fiber  grains, such as whole-wheat breads, oat bran, and wheat cereals. Cooking Do not add salt to food when cooking. Place a salt shaker on the table and allow each person to add his or her own salt to taste. Use vegetable protein, such as beans, textured vegetable protein (TVP), or tofu, instead of meat in pasta, casseroles, and soups. Meal planning Eat less salt, if told by your dietitian. To do this: Avoid eating processed or pre-made food. Avoid eating fast food. Eat less animal protein, including cheese, meat, poultry, or fish, if told by your dietitian. To do this: Limit the number of times you have meat, poultry, fish, or cheese each week. Eat a diet free of meat at least 2 days a week. Eat only one serving each day of meat, poultry, fish, or seafood. When you prepare animal protein, cut pieces into small portion sizes. For most meat and fish, one serving is about the size of the palm of your hand. Eat at least five servings of fresh fruits and vegetables each day. To do this: Keep fruits and vegetables on hand for snacks. Eat one piece of fruit or a handful of berries with breakfast. Have a salad and fruit at lunch. Have two kinds of vegetables at dinner. Limit foods that are high in a substance called oxalate. These include: Spinach (cooked), rhubarb, beets, sweet potatoes, and Swiss chard. Peanuts. Potato chips, french fries, and baked potatoes with skin on. Nuts and nut products. Chocolate. If you regularly take a diuretic medicine, make sure to eat at least 1 or 2 servings of fruits or vegetables that are   high in potassium each day. These include: Avocado. Banana. Orange, prune, carrot, or tomato juice. Baked potato. Cabbage. Beans and split peas. Lifestyle  Drink enough fluid to keep your urine pale yellow. This is the most important thing you can do. Spread your fluid intake throughout the day. If you drink alcohol: Limit how much you use to: 0-1 drink a day for women who  are not pregnant. 0-2 drinks a day for men. Be aware of how much alcohol is in your drink. In the [place] one drink equals one 12 oz bottle of beer (355 mL), one 5 oz glass of wine (148 mL), or one 1 oz glass of hard liquor (44 mL). Lose weight if told by your health care provider. Work with your dietitian to find an eating plan and weight loss strategies that work best for you.  General information Talk to your health care provider and dietitian about taking daily supplements. You may be told the following depending on your health and the cause of your kidney stones: Not to take supplements with vitamin C. To take a calcium supplement. To take a daily probiotic supplement. To take other supplements such as magnesium, fish oil, or vitamin B6. Take over-the-counter and prescription medicines only as told by your health care provider. These include supplements. What foods should I limit? Limit your intake of the following foods, or eat them as told by your dietitian. Vegetables Spinach. Rhubarb. Beets. Canned vegetables. Pickles. Olives. Baked potatoeswith skin. Grains Wheat bran. Baked goods. Salted crackers. Cereals high in sugar. Meats and other proteins Nuts. Nut butters. Large portions of meat, poultry, or fish. Salted, precooked,or cured meats, such as sausages, meat loaves, and hot dogs. Dairy Cheese. Beverages Regular soft drinks. Regular vegetable juice. Seasonings and condiments Seasoning blends with salt. Salad dressings. Soy sauce. Ketchup. Barbecue sauce. Other foods Canned soups. Canned pasta sauce. Casseroles. Pizza. Lasagna. Frozen meals.Potato chips. French fries. The items listed above may not be a complete list of foods and beverages you should limit. Contact a dietitian for more information. What foods should I avoid? Talk to your dietitian about specific foods you should avoid based on the typeof kidney stones you have and your overall health. Fruits Grapefruit. The  item listed above may not be a complete list of foods and beverages you should avoid. Contact a dietitian for more information. Summary Kidney stones are deposits of minerals and salts that form inside your kidneys. You can lower your risk of kidney stones by making changes to your diet. The most important thing you can do is drink enough fluid. Drink enough fluid to keep your urine pale yellow. Talk to your dietitian about how much calcium you should have each day, and eat less salt and animal protein as told by your dietitian. This information is not intended to replace advice given to you by your health care provider. Make sure you discuss any questions you have with your healthcare provider. Document Revised: 11/22/2019 Document Reviewed: 11/22/2019 Elsevier Patient Education  2022 Elsevier Inc.  

## 2021-07-22 NOTE — Progress Notes (Signed)
07/22/2021 1:27 PM   [name] [birth year] [id number]  Referring provider: Erven Colla, DO [address] [place],   [address]  Patient location: home Physician location: office I connected with  [name] on 07/22/21 by a video enabled telemedicine application and verified that I am speaking with the correct person using two identifiers.   I discussed the limitations of evaluation and management by telemedicine. The patient expressed understanding and agreed to proceed.    Followup nephrolithiasis  HPI: [name] is a 35yo here for followup for nephrolithiasis. He passed a few small grains of sand over the past year. Renal US 8/5 shows no calculi. He denies any worsening LUTS. No hematuria dysuria. He drinks 30oz of water daily. No other complaints today   PMH: Past Medical History:  Diagnosis Date   Acid reflux    Arthritis    Elevated LFTs    mildly elevated transaminases in past, now normalized    GERD (gastroesophageal reflux disease)    Gout    History of kidney stones    Hypertension     Surgical History: Past Surgical History:  Procedure Laterality Date   APPENDECTOMY  01/27/2015   BIOPSY  12/10/2019   Procedure: BIOPSY;  Surgeon: Rogene Houston, MD;  [facility]: [facility];  Service: Endoscopy;;  esophagus gastric   CHOLECYSTECTOMY     COLONOSCOPY WITH ESOPHAGOGASTRODUODENOSCOPY (EGD) N/A 02/25/2014   NL TI, 2 SIMPLE ADENOMAS(1:>1 CM), LGE IH-FG POLYPS, PROMINENT AMPULLA. Needs Colonoscopy surveillance in 2018   ESOPHAGEAL DILATION N/A 08/30/2018   Procedure: ESOPHAGEAL DILATION;  Surgeon: Rogene Houston, MD;  Location: [facility];  Service: Endoscopy;  Laterality: N/A;   ESOPHAGEAL DILATION N/A 12/10/2019   Procedure: ESOPHAGEAL DILATION;  Surgeon: Rogene Houston, MD;  [facility]: [facility];  Service: Endoscopy;  Laterality: N/A;   ESOPHAGOGASTRODUODENOSCOPY N/A 04/29/2015   Dr. Oneida Alar: 1. mild non-erosive gastritis  (inflammation) was found in the gastric antrum. 2. Prominent Ampullla 31mx 671m benign path with pyloric metaplasia   ESOPHAGOGASTRODUODENOSCOPY N/A 07/12/2016   Procedure: ESOPHAGOGASTRODUODENOSCOPY (EGD);  Surgeon: SaDanie BinderMD;  [facility]: [facility];  Service: Endoscopy;  Laterality: N/A;  830   ESOPHAGOGASTRODUODENOSCOPY N/A 08/30/2018   Procedure: ESOPHAGOGASTRODUODENOSCOPY (EGD);  Surgeon: ReRogene HoustonMD;  [facility];  Service: Endoscopy;  Laterality: N/A;  2:00   ESOPHAGOGASTRODUODENOSCOPY (EGD) WITH PROPOFOL N/A 12/10/2019   Procedure: ESOPHAGOGASTRODUODENOSCOPY (EGD) WITH PROPOFOL;  Surgeon: ReRogene HoustonMD;  [facility]: [facility];  Service: Endoscopy;  Laterality: N/A;  12:55   EUS N/A 03/20/2014   Dr. OuPaulita Fujitaprominent major papilla s/p biopsy, minor papilla without clear adenomatous or mass-like appearance, chronic duodenitis on path.    INCISIONAL HERNIA REPAIR N/A 05/02/2019   Procedure: HERNIA REPAIR OPEN INCISIONAL WITH MESH;  Surgeon: BrVirl CageyMD;  Location: [facility];  Service: General;  Laterality: N/A;   KIDNEY STONE SURGERY  age 89nd age 35 lithotripsy, stent   POLYPECTOMY  12/10/2019   Procedure: POLYPECTOMY;  Surgeon: ReRogene HoustonMD;  [facility];  Service: Endoscopy;;  gastric   SAVORY DILATION N/A 07/12/2016   Procedure: SAVORY DILATION;  Surgeon: SaDanie BinderMD;  [facility];  Service: Endoscopy;  Laterality: N/A;   VENTRAL HERNIA REPAIR N/A 11/28/2019   Procedure: LAPAROSCOPIC VENTRAL HERNIA WITH MESH;  Surgeon: BrVirl CageyMD;  Location: [facility];  Service: General;  Laterality: N/A;  WISDOM TOOTH EXTRACTION      Home Medications:  Allergies as of 07/22/2021       Reactions   Phenergan [promethazine Hcl] Other (See Comments)   arn swelled and hurt for 3 days, pt would rather not have        Medication List        Accurate as of July 22, 2021  1:27 PM. If you have any  questions, ask your nurse or doctor.          allopurinol 300 MG tablet Commonly known as: ZYLOPRIM TAKE ONE TABLET BY MOUTH TWICE DAILY   amLODipine 5 MG tablet Commonly known as: NORVASC Take 1 tablet (5 mg total) by mouth at bedtime.   ciprofloxacin 500 MG tablet Commonly known as: CIPRO Take 1 tablet (500 mg total) by mouth 2 (two) times daily.   Colchicine 0.6 MG Caps Commonly known as: Mitigare One tid prn gout till relief   metroNIDAZOLE 250 MG tablet Commonly known as: FLAGYL Take 1 tablet (250 mg total) by mouth 3 (three) times daily.   ondansetron 4 MG tablet Commonly known as: ZOFRAN Take 1 tablet (4 mg total) by mouth every 8 (eight) hours as needed for nausea or vomiting.   pantoprazole 40 MG tablet Commonly known as: PROTONIX Take 1 tablet (40 mg total) by mouth daily.        Allergies:  Allergies  Allergen Reactions   Phenergan [Promethazine Hcl] Other (See Comments)    arn swelled and hurt for 3 days, pt would rather not have    Family History: Family History  Problem Relation Age of Onset   Diabetes Father    COPD Father    Colon cancer Neg Hx        does not know paternal side    Social History:  reports that he has never smoked. He has never used smokeless tobacco. He reports that he does not drink alcohol and does not use drugs.  ROS: All other review of systems were reviewed and are negative except what is noted above in HPI   Laboratory Data: Lab Results  Component Value Date   WBC 9.9 07/14/2021   HGB 17.5 (H) 07/14/2021   HCT 50.3 (H) 07/14/2021   MCV 88.4 07/14/2021   PLT 308 07/14/2021    Lab Results  Component Value Date   CREATININE 1.29 (H) 07/14/2021    No results found for: PSA  No results found for: TESTOSTERONE  No results found for: HGBA1C  Urinalysis    Component Value Date/Time   COLORURINE YELLOW 02/24/2009 0912   APPEARANCEUR Clear 07/23/2020 1532   LABSPEC >1.030 (H) 02/24/2009 0912   PHURINE  6.0 02/24/2009 0912   GLUCOSEU Negative 07/23/2020 1532   HGBUR LARGE (A) 02/24/2009 0912   BILIRUBINUR Negative 07/23/2020 1532   KETONESUR NEGATIVE 02/24/2009 0912   PROTEINUR Negative 07/23/2020 1532   PROTEINUR 30 (A) 02/24/2009 0912   UROBILINOGEN negative (A) 01/30/2020 1421   UROBILINOGEN 0.2 02/24/2009 0912   NITRITE Negative 07/23/2020 1532   NITRITE NEGATIVE 02/24/2009 0912   LEUKOCYTESUR Negative 07/23/2020 1532    Lab Results  Component Value Date   LABMICR Comment 07/23/2020    Pertinent Imaging: Renal US 07/17/2021: Images reviewed and discussed with the patient Results for orders placed during the hospital encounter of 06/25/05  DG Abd 1 View  Narrative Clinical Data: Kidney stone. ABDOMEN - ONE VIEW: Findings: Approximate 2 x 3 mm calcific density projects over the region of  the upper pole of the right kidney. This is also noted on 04/16/05 plain film and is compatible with a small calculus. No definite calcifications along the distribution of the ureters.  Impression Small right renal calculus.  Provider: Briscoe Burns  No results found for this or any previous visit.  No results found for this or any previous visit.  No results found for this or any previous visit.  Results for orders placed during the hospital encounter of 07/15/21  Ultrasound renal complete  Narrative CLINICAL DATA:  Nephrolithiasis.  EXAM: RENAL / URINARY TRACT ULTRASOUND COMPLETE  COMPARISON:  CT 05/30/2020  FINDINGS: Right Kidney:  Renal measurements: 11.1 x 5.4 x 5.6 cm = volume: 177 mL. Echogenicity within normal limits. No mass or hydronephrosis visualized. No intrarenal calculi identified.  Left Kidney:  Renal measurements: 13.2 x 5.6 x 4.7 cm = volume: 180 mL. Echogenicity within normal limits. No mass or hydronephrosis visualized. No intrarenal calculi identified.  Bladder:  Appears normal for degree of bladder distention. Bilateral ureteral jets are  noted.  Other:  At least moderate hepatic steatosis incidentally noted.  IMPRESSION: Normal renal sonogram.  Moderate hepatic steatosis.   Electronically Signed By: Fidela Salisbury MD On: 07/17/2021 02:58  No results found for this or any previous visit.  No results found for this or any previous visit.  No results found for this or any previous visit.   Assessment & Plan:    1. Kidney stones -Increase water intake to over 60oz daily -RTC 1 year with renal US   No follow-ups on file.  Nicolette Bang, MD  [facility] [place]

## 2021-07-27 DIAGNOSIS — K432 Incisional hernia without obstruction or gangrene: Secondary | ICD-10-CM | POA: Diagnosis not present

## 2021-07-27 DIAGNOSIS — Z6836 Body mass index (BMI) 36.0-36.9, adult: Secondary | ICD-10-CM | POA: Diagnosis not present

## 2021-07-29 ENCOUNTER — Other Ambulatory Visit (INDEPENDENT_AMBULATORY_CARE_PROVIDER_SITE_OTHER): Payer: Self-pay

## 2021-07-29 DIAGNOSIS — R7989 Other specified abnormal findings of blood chemistry: Secondary | ICD-10-CM

## 2021-07-30 DIAGNOSIS — R7989 Other specified abnormal findings of blood chemistry: Secondary | ICD-10-CM | POA: Diagnosis not present

## 2021-07-30 LAB — COMPREHENSIVE METABOLIC PANEL
AG Ratio: 1.7 (calc) (ref 1.0–2.5)
ALT: 56 U/L — ABNORMAL HIGH (ref 9–46)
AST: 33 U/L (ref 10–40)
Albumin: 4.6 g/dL (ref 3.6–5.1)
Alkaline phosphatase (APISO): 75 U/L (ref 36–130)
BUN: 13 mg/dL (ref 7–25)
CO2: 30 mmol/L (ref 20–32)
Calcium: 9.5 mg/dL (ref 8.6–10.3)
Chloride: 103 mmol/L (ref 98–110)
Creat: 1.08 mg/dL (ref 0.60–1.26)
Globulin: 2.7 g/dL (calc) (ref 1.9–3.7)
Glucose, Bld: 95 mg/dL (ref 65–99)
Potassium: 4 mmol/L (ref 3.5–5.3)
Sodium: 140 mmol/L (ref 135–146)
Total Bilirubin: 0.6 mg/dL (ref 0.2–1.2)
Total Protein: 7.3 g/dL (ref 6.1–8.1)

## 2021-08-10 NOTE — Progress Notes (Signed)
Results sent via my chart 

## 2021-08-17 ENCOUNTER — Encounter: Payer: Self-pay | Admitting: Urology

## 2021-08-18 NOTE — Telephone Encounter (Signed)
Please advise 

## 2021-08-21 ENCOUNTER — Ambulatory Visit (INDEPENDENT_AMBULATORY_CARE_PROVIDER_SITE_OTHER): Payer: BC Managed Care – PPO | Admitting: Urology

## 2021-08-21 ENCOUNTER — Other Ambulatory Visit: Payer: Self-pay

## 2021-08-21 ENCOUNTER — Encounter: Payer: Self-pay | Admitting: Urology

## 2021-08-21 VITALS — BP 126/74 | HR 71

## 2021-08-21 DIAGNOSIS — N50819 Testicular pain, unspecified: Secondary | ICD-10-CM | POA: Diagnosis not present

## 2021-08-21 LAB — URINALYSIS, ROUTINE W REFLEX MICROSCOPIC
Bilirubin, UA: NEGATIVE
Glucose, UA: NEGATIVE
Ketones, UA: NEGATIVE
Leukocytes,UA: NEGATIVE
Nitrite, UA: NEGATIVE
Protein,UA: NEGATIVE
RBC, UA: NEGATIVE
Specific Gravity, UA: 1.025 (ref 1.005–1.030)
Urobilinogen, Ur: 0.2 mg/dL (ref 0.2–1.0)
pH, UA: 5.5 (ref 5.0–7.5)

## 2021-08-21 MED ORDER — DOXYCYCLINE HYCLATE 100 MG PO CAPS: 100.0000 mg | ORAL_CAPSULE | Freq: Two times a day (BID) | ORAL | 0 refills | Status: DC

## 2021-08-21 NOTE — Progress Notes (Signed)
08/21/2021 12:52 PM   [name] 01/25/1986 [id number]  Referring provider: Erven Colla, DO [place],  [place] [address]  Left testicular pain   HPI: Mr [name] is a 35yo here with new left testicular pain. Starting 5 days ago he noted dull left testicular pain that was constant and then in the last 3 days became sharp and the pain worsened. The pain is worse with activity and worse with sitting. No alleviating events. He denies any LUTS. No other associated symptoms. No other complaints today    PMH: Past Medical History:  Diagnosis Date   Acid reflux    Arthritis    Elevated LFTs    mildly elevated transaminases in past, now normalized    GERD (gastroesophageal reflux disease)    Gout    History of kidney stones    Hypertension     Surgical History: Past Surgical History:  Procedure Laterality Date   APPENDECTOMY  01/27/2015   BIOPSY  12/10/2019   Procedure: BIOPSY;  Surgeon: Rogene Houston, MD;  [facility];  Service: Endoscopy;;  esophagus gastric   CHOLECYSTECTOMY     COLONOSCOPY WITH ESOPHAGOGASTRODUODENOSCOPY (EGD) N/A 02/25/2014   NL TI, 2 SIMPLE ADENOMAS(1:>1 CM), LGE IH-FG POLYPS, PROMINENT AMPULLA. Needs Colonoscopy surveillance in 2018   ESOPHAGEAL DILATION N/A 08/30/2018   Procedure: ESOPHAGEAL DILATION;  Surgeon: Rogene Houston, MD;  [facility];  Service: Endoscopy;  Laterality: N/A;   ESOPHAGEAL DILATION N/A 12/10/2019   Procedure: ESOPHAGEAL DILATION;  Surgeon: Rogene Houston, MD;  [facility];  Service: Endoscopy;  Laterality: N/A;   ESOPHAGOGASTRODUODENOSCOPY N/A 04/29/2015   Dr. Oneida Alar: 1. mild non-erosive gastritis (inflammation) was found in the gastric antrum. 2. Prominent Ampullla 88mx 668m benign path with pyloric metaplasia   ESOPHAGOGASTRODUODENOSCOPY N/A 07/12/2016   Procedure: ESOPHAGOGASTRODUODENOSCOPY (EGD);  Surgeon: SaDanie BinderMD;  [facility];  Service:  Endoscopy;  Laterality: N/A;  830   ESOPHAGOGASTRODUODENOSCOPY N/A 08/30/2018   Procedure: ESOPHAGOGASTRODUODENOSCOPY (EGD);  Surgeon: ReRogene HoustonMD;  [facility];  Service: Endoscopy;  Laterality: N/A;  2:00   ESOPHAGOGASTRODUODENOSCOPY (EGD) WITH PROPOFOL N/A 12/10/2019   Procedure: ESOPHAGOGASTRODUODENOSCOPY (EGD) WITH PROPOFOL;  Surgeon: ReRogene HoustonMD;  [facility];  Service: Endoscopy;  Laterality: N/A;  12:55   EUS N/A 03/20/2014   Dr. OuPaulita Fujitaprominent major papilla s/p biopsy, minor papilla without clear adenomatous or mass-like appearance, chronic duodenitis on path.    INCISIONAL HERNIA REPAIR N/A 05/02/2019   Procedure: HERNIA REPAIR OPEN INCISIONAL WITH MESH;  Surgeon: BrVirl CageyMD;  Location: [facility];  Service: General;  Laterality: N/A;   KIDNEY STONE SURGERY  age [age] age 89 lithotripsy, stent   POLYPECTOMY  12/10/2019   Procedure: POLYPECTOMY;  Surgeon: ReRogene HoustonMD;  [facility];  Service: Endoscopy;;  gastric   SAVORY DILATION N/A 07/12/2016   Procedure: SAVORY DILATION;  Surgeon: SaDanie BinderMD;  [facility];  Service: Endoscopy;  Laterality: N/A;   VENTRAL HERNIA REPAIR N/A 11/28/2019   Procedure: LAPAROSCOPIC VENTRAL HERNIA WITH MESH;  Surgeon: BrVirl CageyMD;  Location: [facility];  Service: General;  Laterality: N/A;   WISDOM TOOTH EXTRACTION      Home Medications:  Allergies as of 08/21/2021       Reactions   Phenergan [promethazine Hcl] Other (See Comments)   arn swelled and hurt for  3 days, pt would rather not have        Medication List        Accurate as of August 21, 2021 12:52 PM. If you have any questions, ask your nurse or doctor.          STOP taking these medications    ciprofloxacin 500 MG tablet Commonly known as: CIPRO Stopped by: Nicolette Bang, MD   Colchicine 0.6 MG Caps Commonly known as: Mitigare Stopped by: Nicolette Bang, MD    metroNIDAZOLE 250 MG tablet Commonly known as: FLAGYL Stopped by: Nicolette Bang, MD   ondansetron 4 MG tablet Commonly known as: ZOFRAN Stopped by: Nicolette Bang, MD       TAKE these medications    allopurinol 300 MG tablet Commonly known as: ZYLOPRIM TAKE ONE TABLET BY MOUTH TWICE DAILY   amLODipine 5 MG tablet Commonly known as: NORVASC Take 1 tablet (5 mg total) by mouth at bedtime.   doxycycline 100 MG capsule Commonly known as: VIBRAMYCIN Take 1 capsule (100 mg total) by mouth every 12 (twelve) hours. Started by: Nicolette Bang, MD   pantoprazole 40 MG tablet Commonly known as: PROTONIX Take 1 tablet (40 mg total) by mouth daily.        Allergies:  Allergies  Allergen Reactions   Phenergan [Promethazine Hcl] Other (See Comments)    arn swelled and hurt for 3 days, pt would rather not have    Family History: Family History  Problem Relation Age of Onset   Diabetes Father    COPD Father    Colon cancer Neg Hx        does not know paternal side    Social History:  reports that he has never smoked. He has never used smokeless tobacco. He reports that he does not drink alcohol and does not use drugs.  ROS: All other review of systems were reviewed and are negative except what is noted above in HPI  Physical Exam: BP 126/74   Pulse 71   Constitutional:  Alert and oriented, No acute distress. HEENT: [place] AT, moist mucus membranes.  Trachea midline, no masses. Cardiovascular: No clubbing, cyanosis, or edema. Respiratory: Normal respiratory effort, no increased work of breathing. GI: Abdomen is soft, nontender, nondistended, no abdominal masses GU: No CVA tenderness. Circumcised phallus. No masses/lesions on penis, testis, scrotum. Pain in left epididymis, induration Lymph: No cervical or inguinal lymphadenopathy. Skin: No rashes, bruises or suspicious lesions. Neurologic: Grossly intact, no focal deficits, moving all 4 extremities. Psychiatric:  Normal mood and affect.  Laboratory Data: Lab Results  Component Value Date   WBC 9.9 07/14/2021   HGB 17.5 (H) 07/14/2021   HCT 50.3 (H) 07/14/2021   MCV 88.4 07/14/2021   PLT 308 07/14/2021    Lab Results  Component Value Date   CREATININE 1.08 07/30/2021    No results found for: PSA  No results found for: TESTOSTERONE  No results found for: HGBA1C  Urinalysis    Component Value Date/Time   COLORURINE YELLOW 02/24/2009 0912   APPEARANCEUR Clear 07/23/2020 1532   LABSPEC >1.030 (H) 02/24/2009 0912   PHURINE 6.0 02/24/2009 0912   GLUCOSEU Negative 07/23/2020 1532   HGBUR LARGE (A) 02/24/2009 0912   BILIRUBINUR Negative 07/23/2020 1532   KETONESUR NEGATIVE 02/24/2009 0912   PROTEINUR Negative 07/23/2020 1532   PROTEINUR 30 (A) 02/24/2009 0912   UROBILINOGEN negative (A) 01/30/2020 1421   UROBILINOGEN 0.2 02/24/2009 0912   NITRITE Negative 07/23/2020 1532   NITRITE NEGATIVE  02/24/2009 0912   LEUKOCYTESUR Negative 07/23/2020 1532    Lab Results  Component Value Date   LABMICR Comment 07/23/2020    Pertinent Imaging:  Results for orders placed during the hospital encounter of 06/25/05  DG Abd 1 View  Narrative Clinical Data: Kidney stone. ABDOMEN - ONE VIEW: Findings: Approximate 2 x 3 mm calcific density projects over the region of the upper pole of the right kidney. This is also noted on 04/16/05 plain film and is compatible with a small calculus. No definite calcifications along the distribution of the ureters.  Impression Small right renal calculus.  Provider: Briscoe Burns  No results found for this or any previous visit.  No results found for this or any previous visit.  No results found for this or any previous visit.  Results for orders placed during the hospital encounter of 07/15/21  Ultrasound renal complete  Narrative CLINICAL DATA:  Nephrolithiasis.  EXAM: RENAL / URINARY TRACT ULTRASOUND COMPLETE  COMPARISON:  CT  05/30/2020  FINDINGS: Right Kidney:  Renal measurements: 11.1 x 5.4 x 5.6 cm = volume: 177 mL. Echogenicity within normal limits. No mass or hydronephrosis visualized. No intrarenal calculi identified.  Left Kidney:  Renal measurements: 13.2 x 5.6 x 4.7 cm = volume: 180 mL. Echogenicity within normal limits. No mass or hydronephrosis visualized. No intrarenal calculi identified.  Bladder:  Appears normal for degree of bladder distention. Bilateral ureteral jets are noted.  Other:  At least moderate hepatic steatosis incidentally noted.  IMPRESSION: Normal renal sonogram.  Moderate hepatic steatosis.   Electronically Signed By: Fidela Salisbury MD On: 07/17/2021 02:58  No results found for this or any previous visit.  No results found for this or any previous visit.  No results found for this or any previous visit.   Assessment & Plan:    1. Left epididymitis -doxycycline '100mg'$  BID for 14 days - Urinalysis, Routine w reflex microscopic   No follow-ups on file.  Nicolette Bang, MD  [facility] [place]

## 2021-08-21 NOTE — Patient Instructions (Signed)
Epididymitis Epididymitis is swelling (inflammation) or infection of the epididymis. The epididymis is a cord-like structure that is located along the top and back part of the testicle. It collects and stores sperm from the testicle. This condition can also cause pain and swelling of the testicle and scrotum. Symptoms usually start suddenly (acute epididymitis). Sometimes epididymitis starts gradually and lasts for a while (chronic epididymitis). This type may be harder to treat. What are the causes? In men ages 50-40, this condition is usually caused by a bacterial infection or a sexually transmitted disease (STD), such as: Gonorrhea. Chlamydia. In men 56 and older who do not have anal sex, this condition is usually caused by bacteria from a blockage or from abnormalities in the urinary system. These can result from: Having a tube placed into the bladder (urinary catheter). Having an enlarged or inflamed prostate gland. Having recently had urinary tract surgery. Having a problem with a backward flow of urine (retrograde). In men who have a condition that weakens the body's defense system (immune system), such as HIV, this condition can be caused by: Other bacteria, including tuberculosis and syphilis. Viruses. Fungi. Sometimes this condition occurs without infection. This may happen because of trauma or repetitive activities such as sports. What increases the risk? You are more likely to develop this condition if you have: Unprotected sex with more than one partner. Anal sex. Recently had surgery. A urinary catheter. Urinary problems. A suppressed immune system. What are the signs or symptoms? This condition usually begins suddenly with chills, fever, and pain behind the scrotum and in the testicle. Other symptoms include: Swelling of the scrotum, testicle, or both. Pain when ejaculating or urinating. Pain in the back or abdomen. Nausea. Itching and discharge from the penis. A  frequent need to pass urine. Redness, increased warmth, and tenderness of the scrotum. How is this diagnosed? Your health care provider can diagnose this condition based on your symptoms and medical history. Your health care provider will also do a physical exam to ask about your symptoms and check your scrotum and testicle for swelling, pain, and redness. You may also have other tests, including: Examination of discharge from the penis. Urine tests for infections, such as STDs. Ultrasound test for blood flow and inflammation. Your health care provider may test you for other STDs, including HIV. How is this treated? Treatment for this condition depends on the cause. If your condition is caused by a bacterial infection, oral antibiotic medicine may be prescribed. If the bacterial infection has spread to your blood, you may need to receive IV antibiotics. For both bacterial and nonbacterial epididymitis, you may be treated with: Rest. Elevation of the scrotum. Pain medicines. Anti-inflammatory medicines. Surgery may be needed to treat: Bacterial epididymitis that causes pus to build up in the scrotum (abscess). Chronic epididymitis that has not responded to other treatments. Follow these instructions at home: Medicines Take over-the-counter and prescription medicines only as told by your health care provider. If you were prescribed an antibiotic medicine, take it as told by your health care provider. Do not stop taking the antibiotic even if your condition improves. Sexual activity If your epididymitis was caused by an STD, avoid sexual activity until your treatment is complete. Inform your sexual partner or partners if you test positive for an STD. They may need to be treated. Do not engage in sexual activity with your partner or partners until their treatment is completed. Managing pain and swelling  If directed, elevate your scrotum and apply  ice. Put ice in a plastic bag. Place a small  towel or pillow between your legs. Rest your scrotum on the pillow or towel. Place another towel between your skin and the plastic bag. Leave the ice on for 20 minutes, 2-3 times a day. Try taking a sitz bath to help with discomfort. This is a warm water bath that is taken while you are sitting down. The water should only come up to your hips and should cover your buttocks. Do this 3-4 times per day or as told by your health care provider. Keep your scrotum elevated and supported while resting. Ask your health care provider if you should wear a scrotal support, such as a jockstrap. Wear it as told by your health care provider. General instructions Return to your normal activities as told by your health care provider. Ask your health care provider what activities are safe for you. Drink enough fluid to keep your urine pale yellow. Keep all follow-up visits as told by your health care provider. This is important. Contact a health care provider if: You have a fever. Your pain medicine is not helping. Your pain is getting worse. Your symptoms do not improve within 3 days. Summary Epididymitis is swelling (inflammation) or infection of the epididymis. This condition can also cause pain and swelling of the testicle and scrotum. Treatment for this condition depends on the cause. If your condition is caused by a bacterial infection, oral antibiotic medicine may be prescribed. Inform your sexual partner or partners if you test positive for an STD. They may need to be treated. Do not engage in sexual activity with your partner or partners until their treatment is completed. Contact a health care provider if your symptoms do not improve within 3 days. This information is not intended to replace advice given to you by your health care provider. Make sure you discuss any questions you have with your health care provider. Document Revised: 02/11/2021 Document Reviewed: 10/03/2018 Elsevier Patient Education   2022 [employer].

## 2021-08-21 NOTE — Progress Notes (Signed)
Urological Symptom Review  Patient is experiencing the following symptoms: Frequent urination Get up at night to urinate Leakage of urine Kidney stones   Review of Systems  Gastrointestinal (upper)  : Indigestion/heartburn  Gastrointestinal (lower) : Diarrhea  Constitutional : Night Sweats  Skin: Negative for skin symptoms  Eyes: Negative for eye symptoms  Ear/Nose/Throat : Negative for Ear/Nose/Throat symptoms  Hematologic/Lymphatic: Negative for Hematologic/Lymphatic symptoms  Cardiovascular : Leg swelling  Respiratory : Negative for respiratory symptoms  Endocrine: Negative for endocrine symptoms  Musculoskeletal: Negative for musculoskeletal symptoms  Neurological: Negative for neurological symptoms  Psychologic: Negative for psychiatric symptoms

## 2021-09-02 ENCOUNTER — Encounter (INDEPENDENT_AMBULATORY_CARE_PROVIDER_SITE_OTHER): Payer: Self-pay

## 2021-09-02 NOTE — Telephone Encounter (Signed)
Pt seen 07/14/21 and he states that was for food poisoning and then about one week after being seen for that he started having bright red blood about everytime he has a bowel movement. Blood in stool and when wiping and some blood in the water of toilet also. States he has an ache at times in lower mid abdomen. States he has had this before and had to have some polyps banded. Last colonoscopy 12/27/16

## 2021-09-03 NOTE — Telephone Encounter (Signed)
Per dr Laural Golden - pt needs diagnotic colonoscopy with mac. I called and let pt know and he said he could do any day. I told him he would get a call back after its set up.

## 2021-09-07 ENCOUNTER — Encounter (INDEPENDENT_AMBULATORY_CARE_PROVIDER_SITE_OTHER): Payer: Self-pay

## 2021-09-07 ENCOUNTER — Telehealth (INDEPENDENT_AMBULATORY_CARE_PROVIDER_SITE_OTHER): Payer: Self-pay | Admitting: *Deleted

## 2021-09-07 ENCOUNTER — Telehealth (INDEPENDENT_AMBULATORY_CARE_PROVIDER_SITE_OTHER): Payer: Self-pay

## 2021-09-07 ENCOUNTER — Other Ambulatory Visit (INDEPENDENT_AMBULATORY_CARE_PROVIDER_SITE_OTHER): Payer: Self-pay

## 2021-09-07 DIAGNOSIS — K921 Melena: Secondary | ICD-10-CM

## 2021-09-07 MED ORDER — SUTAB 1479-225-188 MG PO TABS: 1.0000 | ORAL_TABLET | Freq: Once | ORAL | 0 refills | Status: AC

## 2021-09-07 NOTE — Telephone Encounter (Signed)
Pt called for update on colonoscopy. I let pt know that it is being worked on and he should hear back soon from office.

## 2021-09-07 NOTE — Telephone Encounter (Signed)
[name], CMA  

## 2021-09-09 NOTE — Patient Instructions (Signed)
[name]  09/09/2021     @PREFPERIOPPHARMACY @   Your procedure is scheduled on  09/11/2021.   Report to [facility] at  [email or web address].   Call this number if you have problems the morning of surgery:  [phone]   Remember:  Follow the diet and prep instructions given to you by the office.    Take these medicines the morning of surgery with A SIP OF WATER                                 None    Do not wear jewelry, make-up or nail polish.  Do not wear lotions, powders, or perfumes, or deodorant.  Do not shave 48 hours prior to surgery.  Men may shave face and neck.  Do not bring valuables to the hospital.  [facility] is not responsible for any belongings or valuables.  Contacts, dentures or bridgework may not be worn into surgery.  Leave your suitcase in the car.  After surgery it may be brought to your room.  For patients admitted to the hospital, discharge time will be determined by your treatment team.  Patients discharged the day of surgery will not be allowed to drive home and must have someone with them for 24 hours.    Special instructions:   DO NOT smoke tobacco or vape for 24 hours before your procedure.  Please read over the following fact sheets that you were given. Anesthesia Post-op Instructions and Care and Recovery After Surgery      Colonoscopy, Adult, Care After This sheet gives you information about how to care for yourself after your procedure. Your health care provider may also give you more specific instructions. If you have problems or questions, contact your health care provider. What can I expect after the procedure? After the procedure, it is common to have: A small amount of blood in your stool for 24 hours after the procedure. Some gas. Mild cramping or bloating of your abdomen. Follow these instructions at home: Eating and drinking  Drink enough fluid to keep your urine pale yellow. Follow instructions from your health care  provider about eating or drinking restrictions. Resume your normal diet as instructed by your health care provider. Avoid heavy or fried foods that are hard to digest. Activity Rest as told by your health care provider. Avoid sitting for a long time without moving. Get up to take short walks every 1-2 hours. This is important to improve blood flow and breathing. Ask for help if you feel weak or unsteady. Return to your normal activities as told by your health care provider. Ask your health care provider what activities are safe for you. Managing cramping and bloating  Try walking around when you have cramps or feel bloated. Apply heat to your abdomen as told by your health care provider. Use the heat source that your health care provider recommends, such as a moist heat pack or a heating pad. Place a towel between your skin and the heat source. Leave the heat on for 20-30 minutes. Remove the heat if your skin turns bright red. This is especially important if you are unable to feel pain, heat, or cold. You may have a greater risk of getting burned. General instructions If you were given a sedative during the procedure, it can affect you for several hours. Do not drive or operate machinery  until your health care provider says that it is safe. For the first 24 hours after the procedure: Do not sign important documents. Do not drink alcohol. Do your regular daily activities at a slower pace than normal. Eat soft foods that are easy to digest. Take over-the-counter and prescription medicines only as told by your health care provider. Keep all follow-up visits as told by your health care provider. This is important. Contact a health care provider if: You have blood in your stool 2-3 days after the procedure. Get help right away if you have: More than a small spotting of blood in your stool. Large blood clots in your stool. Swelling of your abdomen. Nausea or vomiting. A fever. Increasing pain  in your abdomen that is not relieved with medicine. Summary After the procedure, it is common to have a small amount of blood in your stool. You may also have mild cramping and bloating of your abdomen. If you were given a sedative during the procedure, it can affect you for several hours. Do not drive or operate machinery until your health care provider says that it is safe. Get help right away if you have a lot of blood in your stool, nausea or vomiting, a fever, or increased pain in your abdomen. This information is not intended to replace advice given to you by your health care provider. Make sure you discuss any questions you have with your health care provider. Document Revised: 11/23/2019 Document Reviewed: 06/25/2019 Elsevier Patient Education  [place] After This sheet gives you information about how to care for yourself after your procedure. Your health care provider may also give you more specific instructions. If you have problems or questions, contact your health care provider. What can I expect after the procedure? After the procedure, it is common to have: Tiredness. Forgetfulness about what happened after the procedure. Impaired judgment for important decisions. Nausea or vomiting. Some difficulty with balance. Follow these instructions at home: For the time period you were told by your health care provider:   Rest as needed. Do not participate in activities where you could fall or become injured. Do not drive or use machinery. Do not drink alcohol. Do not take sleeping pills or medicines that cause drowsiness. Do not make important decisions or sign legal documents. Do not take care of children on your own. Eating and drinking Follow the diet that is recommended by your health care provider. Drink enough fluid to keep your urine pale yellow. If you vomit: Drink water, juice, or soup when you can drink without vomiting. Make  sure you have little or no nausea before eating solid foods. General instructions Have a responsible adult stay with you for the time you are told. It is important to have someone help care for you until you are awake and alert. Take over-the-counter and prescription medicines only as told by your health care provider. If you have sleep apnea, surgery and certain medicines can increase your risk for breathing problems. Follow instructions from your health care provider about wearing your sleep device: Anytime you are sleeping, including during daytime naps. While taking prescription pain medicines, sleeping medicines, or medicines that make you drowsy. Avoid smoking. Keep all follow-up visits as told by your health care provider. This is important. Contact a health care provider if: You keep feeling nauseous or you keep vomiting. You feel light-headed. You are still sleepy or having trouble with balance after 24 hours. You develop a  rash. You have a fever. You have redness or swelling around the IV site. Get help right away if: You have trouble breathing. You have new-onset confusion at home. Summary For several hours after your procedure, you may feel tired. You may also be forgetful and have poor judgment. Have a responsible adult stay with you for the time you are told. It is important to have someone help care for you until you are awake and alert. Rest as told. Do not drive or operate machinery. Do not drink alcohol or take sleeping pills. Get help right away if you have trouble breathing, or if you suddenly become confused. This information is not intended to replace advice given to you by your health care provider. Make sure you discuss any questions you have with your health care provider. Document Revised: 08/14/2020 Document Reviewed: 11/01/2019 Elsevier Patient Education  2022 [employer].

## 2021-09-10 ENCOUNTER — Encounter (HOSPITAL_COMMUNITY): Payer: Self-pay

## 2021-09-10 ENCOUNTER — Other Ambulatory Visit: Payer: Self-pay

## 2021-09-10 ENCOUNTER — Encounter (HOSPITAL_COMMUNITY)
Admission: RE | Admit: 2021-09-10 | Discharge: 2021-09-10 | Disposition: A | Discharge status: Home / Self Care | Payer: BC Managed Care – PPO | Source: Ambulatory Visit | Attending: Gastroenterology | Admitting: Gastroenterology

## 2021-09-10 DIAGNOSIS — Z87442 Personal history of urinary calculi: Secondary | ICD-10-CM | POA: Diagnosis not present

## 2021-09-10 DIAGNOSIS — Z01810 Encounter for preprocedural cardiovascular examination: Secondary | ICD-10-CM | POA: Insufficient documentation

## 2021-09-10 DIAGNOSIS — K7581 Nonalcoholic steatohepatitis (NASH): Secondary | ICD-10-CM | POA: Diagnosis not present

## 2021-09-10 DIAGNOSIS — D123 Benign neoplasm of transverse colon: Secondary | ICD-10-CM | POA: Diagnosis not present

## 2021-09-10 DIAGNOSIS — K649 Unspecified hemorrhoids: Secondary | ICD-10-CM | POA: Diagnosis not present

## 2021-09-10 DIAGNOSIS — Z888 Allergy status to other drugs, medicaments and biological substances status: Secondary | ICD-10-CM | POA: Diagnosis not present

## 2021-09-10 DIAGNOSIS — Z9049 Acquired absence of other specified parts of digestive tract: Secondary | ICD-10-CM | POA: Diagnosis not present

## 2021-09-10 DIAGNOSIS — I10 Essential (primary) hypertension: Secondary | ICD-10-CM | POA: Diagnosis not present

## 2021-09-10 DIAGNOSIS — K625 Hemorrhage of anus and rectum: Secondary | ICD-10-CM | POA: Diagnosis not present

## 2021-09-10 DIAGNOSIS — K219 Gastro-esophageal reflux disease without esophagitis: Secondary | ICD-10-CM | POA: Diagnosis not present

## 2021-09-10 DIAGNOSIS — D125 Benign neoplasm of sigmoid colon: Secondary | ICD-10-CM | POA: Diagnosis not present

## 2021-09-10 DIAGNOSIS — Z79899 Other long term (current) drug therapy: Secondary | ICD-10-CM | POA: Diagnosis not present

## 2021-09-10 HISTORY — DX: Other complications of anesthesia, initial encounter: T88.59XA

## 2021-09-11 ENCOUNTER — Ambulatory Visit (HOSPITAL_COMMUNITY)
Admission: RE | Admit: 2021-09-11 | Discharge: 2021-09-11 | Disposition: A | Discharge status: Home / Self Care | Payer: BC Managed Care – PPO | Attending: Gastroenterology | Admitting: Gastroenterology

## 2021-09-11 ENCOUNTER — Encounter (HOSPITAL_COMMUNITY): Payer: Self-pay | Admitting: Gastroenterology

## 2021-09-11 ENCOUNTER — Ambulatory Visit (HOSPITAL_COMMUNITY): Payer: BC Managed Care – PPO | Admitting: Anesthesiology

## 2021-09-11 ENCOUNTER — Encounter (HOSPITAL_COMMUNITY)
Admission: RE | Disposition: A | Discharge status: Home / Self Care | Payer: Self-pay | Source: Home / Self Care | Attending: Gastroenterology

## 2021-09-11 DIAGNOSIS — I10 Essential (primary) hypertension: Secondary | ICD-10-CM | POA: Insufficient documentation

## 2021-09-11 DIAGNOSIS — Z87442 Personal history of urinary calculi: Secondary | ICD-10-CM | POA: Insufficient documentation

## 2021-09-11 DIAGNOSIS — K921 Melena: Secondary | ICD-10-CM

## 2021-09-11 DIAGNOSIS — K7581 Nonalcoholic steatohepatitis (NASH): Secondary | ICD-10-CM | POA: Insufficient documentation

## 2021-09-11 DIAGNOSIS — D124 Benign neoplasm of descending colon: Secondary | ICD-10-CM | POA: Diagnosis not present

## 2021-09-11 DIAGNOSIS — D123 Benign neoplasm of transverse colon: Secondary | ICD-10-CM | POA: Diagnosis not present

## 2021-09-11 DIAGNOSIS — K649 Unspecified hemorrhoids: Secondary | ICD-10-CM

## 2021-09-11 DIAGNOSIS — Z9049 Acquired absence of other specified parts of digestive tract: Secondary | ICD-10-CM | POA: Insufficient documentation

## 2021-09-11 DIAGNOSIS — Z79899 Other long term (current) drug therapy: Secondary | ICD-10-CM | POA: Diagnosis not present

## 2021-09-11 DIAGNOSIS — Z888 Allergy status to other drugs, medicaments and biological substances status: Secondary | ICD-10-CM | POA: Diagnosis not present

## 2021-09-11 DIAGNOSIS — K635 Polyp of colon: Secondary | ICD-10-CM | POA: Diagnosis not present

## 2021-09-11 DIAGNOSIS — D125 Benign neoplasm of sigmoid colon: Secondary | ICD-10-CM | POA: Diagnosis not present

## 2021-09-11 DIAGNOSIS — K219 Gastro-esophageal reflux disease without esophagitis: Secondary | ICD-10-CM | POA: Diagnosis not present

## 2021-09-11 DIAGNOSIS — K625 Hemorrhage of anus and rectum: Secondary | ICD-10-CM

## 2021-09-11 HISTORY — PX: COLONOSCOPY WITH PROPOFOL: SHX5780

## 2021-09-11 HISTORY — PX: POLYPECTOMY: SHX149

## 2021-09-11 LAB — HM COLONOSCOPY

## 2021-09-11 SURGERY — COLONOSCOPY WITH PROPOFOL
Anesthesia: General

## 2021-09-11 MED ORDER — LACTATED RINGERS IV SOLN
INTRAVENOUS | Status: DC
  Administered 2021-09-11: 1000 mL via INTRAVENOUS

## 2021-09-11 MED ORDER — LIDOCAINE 2% (20 MG/ML) 5 ML SYRINGE
INTRAMUSCULAR | Status: DC | PRN
  Administered 2021-09-11: 100 mg via INTRAVENOUS

## 2021-09-11 MED ORDER — PROPOFOL 500 MG/50ML IV EMUL
INTRAVENOUS | Status: DC | PRN
  Administered 2021-09-11: 200 ug/kg/min via INTRAVENOUS

## 2021-09-11 MED ORDER — STERILE WATER FOR IRRIGATION IR SOLN
Status: DC | PRN
  Administered 2021-09-11: 200 mL

## 2021-09-11 NOTE — Anesthesia Preprocedure Evaluation (Signed)
Anesthesia Evaluation  Patient identified by MRN, date of birth, ID band Patient awake    Reviewed: Allergy & Precautions, NPO status , Patient's Chart, lab work & pertinent test results  History of Anesthesia Complications (+) PROLONGED EMERGENCE and history of anesthetic complications  Airway Mallampati: II  TM Distance: >3 FB Neck ROM: Full    Dental  (+) Dental Advisory Given, Chipped,    Pulmonary neg pulmonary ROS,    Pulmonary exam normal breath sounds clear to auscultation       Cardiovascular hypertension, Pt. on medications Normal cardiovascular exam Rhythm:Regular Rate:Normal     Neuro/Psych negative neurological ROS  negative psych ROS   GI/Hepatic GERD  Medicated,(+) Hepatitis -  Endo/Other    Renal/GU Renal disease  negative genitourinary   Musculoskeletal  (+) Arthritis ,   Abdominal   Peds negative pediatric ROS (+)  Hematology   Anesthesia Other Findings   Reproductive/Obstetrics                             Anesthesia Physical Anesthesia Plan  ASA: 2  Anesthesia Plan: General   Post-op Pain Management:    Induction: Intravenous  PONV Risk Score and Plan: Propofol infusion  Airway Management Planned: Nasal Cannula and Natural Airway  Additional Equipment:   Intra-op Plan:   Post-operative Plan:   Informed Consent: I have reviewed the patients History and Physical, chart, labs and discussed the procedure including the risks, benefits and alternatives for the proposed anesthesia with the patient or authorized representative who has indicated his/her understanding and acceptance.     Dental advisory given  Plan Discussed with: CRNA and Surgeon  Anesthesia Plan Comments:         Anesthesia Quick Evaluation

## 2021-09-11 NOTE — H&P (Signed)
[name] is an 35 y.o. male.   Chief Complaint:   Rectal bleeding HPI:  35 year old male with past medical history of Karlene Lineman, GERD, hypertension, history of kidney stones,  who came to the hospital for evaluation of rectal bleeding.    Patient presented an episode of food poisoning in early August and was given a a treatment course with ciprofloxacin and metronidazole.  However, he reported that for the last month he has presented recurrent episodes of rectal bleeding described as fresh blood without presence of any melena or clots.  States that these happen almost with every bowel movement but denies having any lightheadedness or dizziness.  States having some epigastric pain intermittently.  Also reports that he has presented episodes of chronic diarrhea between 5-6 bowel movements every day.  Past Medical History:  Diagnosis Date   Acid reflux    Arthritis    Complication of anesthesia    hard to wake up after anesthesia   Elevated LFTs    mildly elevated transaminases in past, now normalized    GERD (gastroesophageal reflux disease)    Gout    History of kidney stones    Hypertension     Past Surgical History:  Procedure Laterality Date   APPENDECTOMY  01/27/2015   BIOPSY  12/10/2019   Procedure: BIOPSY;  Surgeon: Rogene Houston, MD;  Location: [facility];  Service: Endoscopy;;  esophagus gastric   CHOLECYSTECTOMY     COLONOSCOPY WITH ESOPHAGOGASTRODUODENOSCOPY (EGD) N/A 02/25/2014   NL TI, 2 SIMPLE ADENOMAS(1:>1 CM), LGE IH-FG POLYPS, PROMINENT AMPULLA. Needs Colonoscopy surveillance in 2018   ESOPHAGEAL DILATION N/A 08/30/2018   Procedure: ESOPHAGEAL DILATION;  Surgeon: Rogene Houston, MD;  [facility]: [facility];  Service: Endoscopy;  Laterality: N/A;   ESOPHAGEAL DILATION N/A 12/10/2019   Procedure: ESOPHAGEAL DILATION;  Surgeon: Rogene Houston, MD;  [facility]: [facility];  Service: Endoscopy;  Laterality: N/A;   ESOPHAGOGASTRODUODENOSCOPY N/A 04/29/2015   Dr.  Oneida Alar: 1. mild non-erosive gastritis (inflammation) was found in the gastric antrum. 2. Prominent Ampullla 50mmx 41mm. benign path with pyloric metaplasia   ESOPHAGOGASTRODUODENOSCOPY N/A 07/12/2016   Procedure: ESOPHAGOGASTRODUODENOSCOPY (EGD);  Surgeon: Danie Binder, MD;  [facility]: [facility];  Service: Endoscopy;  Laterality: N/A;  830   ESOPHAGOGASTRODUODENOSCOPY N/A 08/30/2018   Procedure: ESOPHAGOGASTRODUODENOSCOPY (EGD);  Surgeon: Rogene Houston, MD;  [facility]: [facility];  Service: Endoscopy;  Laterality: N/A;  2:00   ESOPHAGOGASTRODUODENOSCOPY (EGD) WITH PROPOFOL N/A 12/10/2019   Procedure: ESOPHAGOGASTRODUODENOSCOPY (EGD) WITH PROPOFOL;  Surgeon: Rogene Houston, MD;  [facility]: [facility];  Service: Endoscopy;  Laterality: N/A;  12:55   EUS N/A 03/20/2014   Dr. Paulita Fujita: prominent major papilla s/p biopsy, minor papilla without clear adenomatous or mass-like appearance, chronic duodenitis on path.    INCISIONAL HERNIA REPAIR N/A 05/02/2019   Procedure: HERNIA REPAIR OPEN INCISIONAL WITH MESH;  Surgeon: Virl Cagey, MD;  Location: [facility];  Service: General;  Laterality: N/A;   KIDNEY STONE SURGERY  age 89 and age 26   lithotripsy, stent   POLYPECTOMY  12/10/2019   Procedure: POLYPECTOMY;  Surgeon: Rogene Houston, MD;  [facility]: [facility];  Service: Endoscopy;;  gastric   SAVORY DILATION N/A 07/12/2016   Procedure: SAVORY DILATION;  Surgeon: Danie Binder, MD;  [facility]: [facility];  Service: Endoscopy;  Laterality: N/A;   VENTRAL HERNIA REPAIR N/A 11/28/2019   Procedure: LAPAROSCOPIC VENTRAL HERNIA WITH MESH;  Surgeon: Curlene Labrum  C, MD;  Location: [facility];  Service: General;  Laterality: N/A;   WISDOM TOOTH EXTRACTION      Family History  Problem Relation Age of Onset   Diabetes Father    COPD Father    Colon cancer Neg Hx        does not know paternal side   Social History:  reports that he has never smoked. He has never used smokeless tobacco. He  reports that he does not drink alcohol and does not use drugs.  Allergies:  Allergies  Allergen Reactions   Phenergan [Promethazine Hcl] Swelling    arn swelled and hurt for 3 days, pt would rather not have    Medications Prior to Admission  Medication Sig Dispense Refill   allopurinol (ZYLOPRIM) 300 MG tablet TAKE ONE TABLET BY MOUTH TWICE DAILY (Patient taking differently: Take 300 mg by mouth at bedtime.) 60 tablet 5   amLODipine (NORVASC) 5 MG tablet Take 1 tablet (5 mg total) by mouth at bedtime. 90 tablet 1   pantoprazole (PROTONIX) 40 MG tablet Take 1 tablet (40 mg total) by mouth daily. (Patient taking differently: Take 40 mg by mouth at bedtime.) 90 tablet 3   doxycycline (VIBRAMYCIN) 100 MG capsule Take 1 capsule (100 mg total) by mouth every 12 (twelve) hours. (Patient not taking: No sig reported) 28 capsule 0    No results found for this or any previous visit (from the past 48 hour(s)). No results found.  Review of Systems  Constitutional: Negative.   HENT: Negative.    Eyes: Negative.   Respiratory: Negative.    Cardiovascular: Negative.   Gastrointestinal:  Positive for blood in stool.  Endocrine: Negative.   Genitourinary: Negative.   Musculoskeletal: Negative.   Skin: Negative.   Allergic/Immunologic: Negative.   Neurological: Negative.   Hematological: Negative.   Psychiatric/Behavioral: Negative.     Blood pressure 129/89, pulse 69, temperature 98.4 F (36.9 C), temperature source Oral, resp. rate 14, SpO2 99 %. Physical Exam  GENERAL: The patient is AO x3, in no acute distress. HEENT: Head is normocephalic and atraumatic. EOMI are intact. Mouth is well hydrated and without lesions. NECK: Supple. No masses LUNGS: Clear to auscultation. No presence of rhonchi/wheezing/rales. Adequate chest expansion HEART: RRR, normal s1 and s2. ABDOMEN: Soft, nontender, no guarding, no peritoneal signs, and nondistended. BS +. No masses. EXTREMITIES: Without any  cyanosis, clubbing, rash, lesions or edema. NEUROLOGIC: AOx3, no focal motor deficit. SKIN: no jaundice, no rashes  Assessment/Plan 35 year old male with past medical history of Karlene Lineman, GERD, hypertension, history of kidney stones,  who came to the hospital for evaluation of rectal bleeding.  We will proceed with colonoscopy today.  Harvel Quale, MD 09/11/2021, 10:09 AM

## 2021-09-11 NOTE — Discharge Instructions (Addendum)
Resume previous diet. Await pathology results. Repeat colonoscopy in 3 years for surveillance. Increase intake of fiber and start Miralax one capful every day

## 2021-09-11 NOTE — Op Note (Signed)
[facility] Patient Name: [name] Procedure Date: 09/11/2021 9:53 AM MRN: [id number] Date of Birth: 1986/02/25 Attending MD: Maylon Peppers ,  CSN: [id number] Age: 35 Admit Type: Outpatient Procedure:                Colonoscopy Indications:              Rectal bleeding Providers:                Maylon Peppers, [place] Page, [place] Risa Grill, Technician Referring MD:              Medicines:                Monitored Anesthesia Care Complications:            No immediate complications. Estimated Blood Loss:     Estimated blood loss: none. Procedure:                Pre-Anesthesia Assessment:                           - Prior to the procedure, a History and Physical                            was performed, and patient medications, allergies                            and sensitivities were reviewed. The patient's                            tolerance of previous anesthesia was reviewed.                           - The risks and benefits of the procedure and the                            sedation options and risks were discussed with the                            patient. All questions were answered and informed                            consent was obtained.                           - ASA Grade Assessment: II - A patient with mild                            systemic disease.                           After obtaining informed consent, the colonoscope                            was passed under direct vision. Throughout the  procedure, the patient's blood pressure, pulse, and                            oxygen saturations were monitored continuously. The                            [id number] ([id number]) was introduced through the                            anus and advanced to the the terminal ileum. The                            colonoscopy was performed without difficulty. The                            patient  tolerated the procedure well. The quality                            of the bowel preparation was good. Scope In: 10:17:50 AM Scope Out: 10:44:57 AM Scope Withdrawal Time: 0 hours 20 minutes 14 seconds  Total Procedure Duration: 0 hours 27 minutes 7 seconds  Findings:      External hemorrhoids were found on perianal exam.      The terminal ileum appeared normal.      Three sessile polyps were found in the descending colon and transverse       colon. The polyps were 4 to 5 mm in size. These polyps were removed with       a cold snare. Resection and retrieval were complete.      A tattoo was seen in the distal sigmoid colon. The tattoo site appeared       normal.      The retroflexed view of the distal rectum and anal verge was normal and       showed no anal or rectal abnormalities. Impression:               - Hemorrhoids found on perianal exam.                           - The examined portion of the ileum was normal.                           - Three 4 to 5 mm polyps in the descending colon                            and in the transverse colon, removed with a cold                            snare. Resected and retrieved.                           - A tattoo was seen in the distal sigmoid colon.                            The tattoo site appeared normal.                           -  The distal rectum and anal verge are normal on                            retroflexion view. Moderate Sedation:      Per Anesthesia Care Recommendation:           - Discharge patient to home (ambulatory).                           - Resume previous diet.                           - Await pathology results.                           - Repeat colonoscopy in 3 years for surveillance.                           - increase intake of fiber and start Miralax one                            capful every day. Procedure Code(s):        --- Professional ---                           [phone], Colonoscopy, flexible; with  removal of                            tumor(s), polyp(s), or other lesion(s) by snare                            technique Diagnosis Code(s):        --- Professional ---                           K64.9, Unspecified hemorrhoids                           K63.5, Polyp of colon                           K62.5, Hemorrhage of anus and rectum CPT copyright 2019 American Medical Association. All rights reserved. The codes documented in this report are preliminary and upon coder review may  be revised to meet current compliance requirements. Maylon Peppers, MD Maylon Peppers,  09/11/2021 10:56:03 AM This report has been signed electronically. Number of Addenda: 0

## 2021-09-11 NOTE — Anesthesia Postprocedure Evaluation (Signed)
Anesthesia Post Note  Patient: [name]  Procedure(s) Performed: COLONOSCOPY WITH PROPOFOL POLYPECTOMY INTESTINAL  Patient location during evaluation: Phase II Anesthesia Type: General Level of consciousness: awake and alert and oriented Pain management: pain level controlled Vital Signs Assessment: post-procedure vital signs reviewed and stable Respiratory status: spontaneous breathing and respiratory function stable Cardiovascular status: blood pressure returned to baseline and stable Postop Assessment: no apparent nausea or vomiting Anesthetic complications: no   No notable events documented.   Last Vitals:  Vitals:   09/11/21 0933 09/11/21 1048  BP: 129/89 102/62  Pulse: 69   Resp: 14 (!) 22  Temp: 36.9 C 37.1 C  SpO2: 99% 96%    Last Pain:  Vitals:   09/11/21 1058  TempSrc:   PainSc: 0-No pain                 Ariyannah Pauling C Benetta Maclaren

## 2021-09-11 NOTE — Transfer of Care (Signed)
Immediate Anesthesia Transfer of Care Note  Patient: [name]  Procedure(s) Performed: COLONOSCOPY WITH PROPOFOL POLYPECTOMY INTESTINAL  Patient Location: Endoscopy Unit  Anesthesia Type:MAC  Level of Consciousness: sedated and patient cooperative  Airway & Oxygen Therapy: Patient Spontanous Breathing and Patient connected to nasal cannula oxygen  Post-op Assessment: Report given to RN, Post -op Vital signs reviewed and stable and Patient moving all extremities  Post vital signs: Reviewed and stable  Last Vitals:  Vitals Value Taken Time  BP 102/62 09/11/21 1048  Temp 37.1 C 09/11/21 1048  Pulse    Resp 22 09/11/21 1048  SpO2 96 % 09/11/21 1048    Last Pain:  Vitals:   09/11/21 1048  TempSrc: Axillary  PainSc:       Patients Stated Pain Goal: 8 (52/84/13 2440)  Complications: No notable events documented.

## 2021-09-14 LAB — SURGICAL PATHOLOGY

## 2021-09-15 ENCOUNTER — Encounter (INDEPENDENT_AMBULATORY_CARE_PROVIDER_SITE_OTHER): Payer: Self-pay | Admitting: *Deleted

## 2021-09-17 ENCOUNTER — Encounter (HOSPITAL_COMMUNITY): Payer: Self-pay | Admitting: Gastroenterology

## 2021-09-22 ENCOUNTER — Other Ambulatory Visit: Payer: Self-pay | Admitting: Family Medicine

## 2021-09-22 DIAGNOSIS — I10 Essential (primary) hypertension: Secondary | ICD-10-CM

## 2021-10-13 ENCOUNTER — Other Ambulatory Visit: Payer: Self-pay | Admitting: Family Medicine

## 2021-10-13 DIAGNOSIS — I10 Essential (primary) hypertension: Secondary | ICD-10-CM

## 2021-10-14 DIAGNOSIS — M791 Myalgia, unspecified site: Secondary | ICD-10-CM | POA: Diagnosis not present

## 2021-10-14 DIAGNOSIS — Z20822 Contact with and (suspected) exposure to covid-19: Secondary | ICD-10-CM | POA: Diagnosis not present

## 2021-10-14 DIAGNOSIS — J101 Influenza due to other identified influenza virus with other respiratory manifestations: Secondary | ICD-10-CM | POA: Diagnosis not present

## 2021-10-28 DIAGNOSIS — M9902 Segmental and somatic dysfunction of thoracic region: Secondary | ICD-10-CM | POA: Diagnosis not present

## 2021-10-28 DIAGNOSIS — M546 Pain in thoracic spine: Secondary | ICD-10-CM | POA: Diagnosis not present

## 2021-10-28 DIAGNOSIS — M542 Cervicalgia: Secondary | ICD-10-CM | POA: Diagnosis not present

## 2021-10-28 DIAGNOSIS — M9901 Segmental and somatic dysfunction of cervical region: Secondary | ICD-10-CM | POA: Diagnosis not present

## 2021-11-04 DIAGNOSIS — M542 Cervicalgia: Secondary | ICD-10-CM | POA: Diagnosis not present

## 2021-11-04 DIAGNOSIS — M546 Pain in thoracic spine: Secondary | ICD-10-CM | POA: Diagnosis not present

## 2021-11-04 DIAGNOSIS — M9902 Segmental and somatic dysfunction of thoracic region: Secondary | ICD-10-CM | POA: Diagnosis not present

## 2021-11-04 DIAGNOSIS — M9901 Segmental and somatic dysfunction of cervical region: Secondary | ICD-10-CM | POA: Diagnosis not present

## 2021-11-11 DIAGNOSIS — M542 Cervicalgia: Secondary | ICD-10-CM | POA: Diagnosis not present

## 2021-11-11 DIAGNOSIS — M546 Pain in thoracic spine: Secondary | ICD-10-CM | POA: Diagnosis not present

## 2021-11-11 DIAGNOSIS — M9901 Segmental and somatic dysfunction of cervical region: Secondary | ICD-10-CM | POA: Diagnosis not present

## 2021-11-11 DIAGNOSIS — M9902 Segmental and somatic dysfunction of thoracic region: Secondary | ICD-10-CM | POA: Diagnosis not present

## 2021-11-16 ENCOUNTER — Ambulatory Visit (INDEPENDENT_AMBULATORY_CARE_PROVIDER_SITE_OTHER): Payer: BC Managed Care – PPO | Admitting: Family Medicine

## 2021-11-16 ENCOUNTER — Other Ambulatory Visit: Payer: Self-pay

## 2021-11-16 DIAGNOSIS — M1009 Idiopathic gout, multiple sites: Secondary | ICD-10-CM | POA: Diagnosis not present

## 2021-11-16 DIAGNOSIS — K7581 Nonalcoholic steatohepatitis (NASH): Secondary | ICD-10-CM | POA: Diagnosis not present

## 2021-11-16 DIAGNOSIS — I10 Essential (primary) hypertension: Secondary | ICD-10-CM | POA: Insufficient documentation

## 2021-11-16 DIAGNOSIS — K219 Gastro-esophageal reflux disease without esophagitis: Secondary | ICD-10-CM

## 2021-11-16 MED ORDER — AMLODIPINE BESYLATE 5 MG PO TABS: 5.0000 mg | ORAL_TABLET | Freq: Every day | ORAL | 3 refills | Status: DC

## 2021-11-16 MED ORDER — ALLOPURINOL 300 MG PO TABS: 300.0000 mg | ORAL_TABLET | Freq: Every day | ORAL | 3 refills | Status: DC

## 2021-11-16 MED ORDER — PANTOPRAZOLE SODIUM 40 MG PO TBEC: 40.0000 mg | DELAYED_RELEASE_TABLET | Freq: Every day | ORAL | 3 refills | Status: DC

## 2021-11-16 NOTE — Assessment & Plan Note (Signed)
Will discuss with GI regarding how often ultrasound needs to be repeated.  Advised weight loss.

## 2021-11-16 NOTE — Patient Instructions (Signed)
I have refilled your meds.  Healthy diet and exercise.  Follow up in 6 months.  Take care  Dr. Lacinda Axon

## 2021-11-16 NOTE — Progress Notes (Signed)
Subjective:  Patient ID: [name], male    DOB: 07-05-1986  Age: 35 y.o. MRN: [id number]  CC: Chief Complaint  Patient presents with   Hypertension    Follow up Establish Care- refills of medications No problems or concerns    HPI:  35 year old male presents for follow-up and to establish care with me.  Gout Patient reports recent gout flare approximate 1 month ago.  Resolved with colchicine.  He is currently on allopurinol 300 mg daily. Last uric acid level was at goal.  Hypertension BP stable on amlodipine.  Needs refill. No reported adverse side effects.  GERD Stable on Protonix.  Needs refill.  NASH Sees GI. LFTs continue to be elevated. Last ultrasound was in 2017. Needs weight loss.  Will discuss with the patient today.  Patient Active Problem List   Diagnosis Date Noted   Essential hypertension 11/16/2021   NASH (nonalcoholic steatohepatitis) 12/25/2020   Hyperlipidemia 09/06/2020   Kidney stones 01/30/2020   History of colonic polyps 03/08/2016   GERD (gastroesophageal reflux disease) 01/24/2014   Elevated LFTs 10/18/2013   Gout 05/05/2013    Social Hx   Social History   Socioeconomic History   Marital status: Married    Spouse name: Not on file   Number of children: 3   Years of education: Not on file   Highest education level: Not on file  Occupational History   Occupation: Agricultural consultant: [employer]  Tobacco Use   Smoking status: Never   Smokeless tobacco: Never  Vaping Use   Vaping Use: Never used  Substance and Sexual Activity   Alcohol use: No   Drug use: No   Sexual activity: Not on file  Other Topics Concern   Not on file  Social History Narrative   Not on file   Social Determinants of Health   Financial Resource Strain: Not on file  Food Insecurity: Not on file  Transportation Needs: Not on file  Physical Activity: Not on file  Stress: Not on file  Social Connections: Not on file   Review of  Systems  Constitutional: Negative.   Musculoskeletal: Negative.    Objective:  BP 128/82   Pulse 84   Temp 97.8 F (36.6 C) (Oral)   Ht 5\' 11"  (1.803 m)   Wt 256 lb 12.8 oz (116.5 kg)   SpO2 100%   BMI 35.82 kg/m   BP/Weight 11/16/2021 09/11/2021 2/87/8676  Systolic BP 720 947 096  Diastolic BP 82 62 82  Wt. (Lbs) 256.8 - 255  BMI 35.82 - 35.57    Physical Exam Vitals and nursing note reviewed.  Constitutional:      General: He is not in acute distress.    Appearance: Normal appearance. He is not ill-appearing.  HENT:     Head: Normocephalic and atraumatic.  Eyes:     General:        Right eye: No discharge.        Left eye: No discharge.     Conjunctiva/sclera: Conjunctivae normal.  Cardiovascular:     Rate and Rhythm: Normal rate and regular rhythm.     Heart sounds: No murmur heard. Pulmonary:     Effort: Pulmonary effort is normal.     Breath sounds: Normal breath sounds. No wheezing, rhonchi or rales.  Neurological:     Mental Status: He is alert.  Psychiatric:        Mood and Affect: Mood normal.  Behavior: Behavior normal.    Lab Results  Component Value Date   WBC 9.9 07/14/2021   HGB 17.5 (H) 07/14/2021   HCT 50.3 (H) 07/14/2021   PLT 308 07/14/2021   GLUCOSE 95 07/30/2021   CHOL 178 03/31/2021   TRIG 144 03/31/2021   HDL 34 (L) 03/31/2021   LDLCALC 118 (H) 03/31/2021   ALT 56 (H) 07/30/2021   AST 33 07/30/2021   NA 140 07/30/2021   K 4.0 07/30/2021   CL 103 07/30/2021   CREATININE 1.08 07/30/2021   BUN 13 07/30/2021   CO2 30 07/30/2021     Assessment & Plan:   Problem List Items Addressed This Visit       Cardiovascular and Mediastinum   Essential hypertension    Stable.  Continue amlodipine.  Refilled today.      Relevant Medications   amLODipine (NORVASC) 5 MG tablet     Digestive   GERD (gastroesophageal reflux disease)    Stable.  Continue Protonix.      Relevant Medications   pantoprazole (PROTONIX) 40 MG  tablet   NASH (nonalcoholic steatohepatitis)    Will discuss with GI regarding how often ultrasound needs to be repeated.  Advised weight loss.        Other   Gout    Stable at this time.  Continue allopurinol.  Refilled today.      Relevant Medications   allopurinol (ZYLOPRIM) 300 MG tablet    Meds ordered this encounter  Medications   allopurinol (ZYLOPRIM) 300 MG tablet    Sig: Take 1 tablet (300 mg total) by mouth at bedtime.    Dispense:  90 tablet    Refill:  3    This prescription was filled on 04/13/2021. Any refills authorized will be placed on file.   amLODipine (NORVASC) 5 MG tablet    Sig: Take 1 tablet (5 mg total) by mouth at bedtime.    Dispense:  90 tablet    Refill:  3    Please schedule follow up   pantoprazole (PROTONIX) 40 MG tablet    Sig: Take 1 tablet (40 mg total) by mouth daily.    Dispense:  90 tablet    Refill:  3    Follow-up:  Return in about 6 months (around 05/17/2022) for Follow up Chronic medical issues.  [place]

## 2021-11-16 NOTE — Assessment & Plan Note (Signed)
Stable.  Continue amlodipine.  Refilled today.

## 2021-11-16 NOTE — Assessment & Plan Note (Signed)
Stable at this time.  Continue allopurinol.  Refilled today.

## 2021-11-16 NOTE — Assessment & Plan Note (Signed)
Stable ?Continue Protonix ?

## 2021-11-18 DIAGNOSIS — M546 Pain in thoracic spine: Secondary | ICD-10-CM | POA: Diagnosis not present

## 2021-11-18 DIAGNOSIS — M542 Cervicalgia: Secondary | ICD-10-CM | POA: Diagnosis not present

## 2021-11-18 DIAGNOSIS — M9902 Segmental and somatic dysfunction of thoracic region: Secondary | ICD-10-CM | POA: Diagnosis not present

## 2021-11-18 DIAGNOSIS — M9901 Segmental and somatic dysfunction of cervical region: Secondary | ICD-10-CM | POA: Diagnosis not present

## 2022-01-25 ENCOUNTER — Encounter (INDEPENDENT_AMBULATORY_CARE_PROVIDER_SITE_OTHER): Payer: Self-pay

## 2022-01-25 ENCOUNTER — Other Ambulatory Visit (INDEPENDENT_AMBULATORY_CARE_PROVIDER_SITE_OTHER): Payer: Self-pay

## 2022-01-25 DIAGNOSIS — R7989 Other specified abnormal findings of blood chemistry: Secondary | ICD-10-CM

## 2022-04-07 DIAGNOSIS — J029 Acute pharyngitis, unspecified: Secondary | ICD-10-CM | POA: Diagnosis not present

## 2022-04-07 DIAGNOSIS — J039 Acute tonsillitis, unspecified: Secondary | ICD-10-CM | POA: Diagnosis not present

## 2022-04-07 DIAGNOSIS — K432 Incisional hernia without obstruction or gangrene: Secondary | ICD-10-CM | POA: Diagnosis not present

## 2022-04-07 DIAGNOSIS — K219 Gastro-esophageal reflux disease without esophagitis: Secondary | ICD-10-CM | POA: Diagnosis not present

## 2022-04-07 DIAGNOSIS — I10 Essential (primary) hypertension: Secondary | ICD-10-CM | POA: Diagnosis not present

## 2022-04-07 DIAGNOSIS — Z79899 Other long term (current) drug therapy: Secondary | ICD-10-CM | POA: Diagnosis not present

## 2022-04-07 DIAGNOSIS — M109 Gout, unspecified: Secondary | ICD-10-CM | POA: Diagnosis not present

## 2022-04-20 ENCOUNTER — Ambulatory Visit (INDEPENDENT_AMBULATORY_CARE_PROVIDER_SITE_OTHER): Payer: BC Managed Care – PPO | Admitting: Family Medicine

## 2022-04-20 DIAGNOSIS — J039 Acute tonsillitis, unspecified: Secondary | ICD-10-CM | POA: Diagnosis not present

## 2022-04-20 MED ORDER — CEFDINIR 300 MG PO CAPS: 300.0000 mg | ORAL_CAPSULE | Freq: Two times a day (BID) | ORAL | 0 refills | Status: DC

## 2022-04-20 NOTE — Patient Instructions (Signed)
Medication as prescribed. ? ?Referral placed to ENT. They should call you to schedule. ? ?Take care ? ?Dr. Lacinda Axon  ?

## 2022-04-21 DIAGNOSIS — J039 Acute tonsillitis, unspecified: Secondary | ICD-10-CM | POA: Insufficient documentation

## 2022-04-21 NOTE — Progress Notes (Signed)
? ?Subjective:  ?Patient ID: [name], male    DOB: 1986/09/11  Age: 36 y.o. MRN: [id number] ? ?CC: ?Chief Complaint  ?Patient presents with  ? Follow-up  ?  Patient stated he was seen in ER for tonsillitis and was told he would need to follow up to get referral to ENT   ? ? ?HPI: ? ?36 year old male presents for evaluation of the above. ? ?Patient recently seen in the ER after experiencing sore throat for 3 weeks.  He was diagnosed with tonsillitis and treated with amoxicillin.  Patient states that he completed the course of antibiotics and now is having recurrent symptoms.  Reports sore throat and some pain with swallowing.  No fever.  Patient is interested in a referral to ENT.  No other associated symptoms.  No other complaints or concerns at this time. ? ?Patient Active Problem List  ? Diagnosis Date Noted  ? Tonsillitis 04/21/2022  ? Essential hypertension 11/16/2021  ? NASH (nonalcoholic steatohepatitis) 12/25/2020  ? Hyperlipidemia 09/06/2020  ? Kidney stones 01/30/2020  ? History of colonic polyps 03/08/2016  ? GERD (gastroesophageal reflux disease) 01/24/2014  ? Elevated LFTs 10/18/2013  ? Gout 05/05/2013  ? ? ?Social Hx   ?Social History  ? ?Socioeconomic History  ? Marital status: Married  ?  Spouse name: Not on file  ? Number of children: 3  ? Years of education: Not on file  ? Highest education level: Not on file  ?Occupational History  ? Occupation: Architect  ?  Employer: [employer]  ?Tobacco Use  ? Smoking status: Never  ? Smokeless tobacco: Never  ?Vaping Use  ? Vaping Use: Never used  ?Substance and Sexual Activity  ? Alcohol use: No  ? Drug use: No  ? Sexual activity: Not on file  ?Other Topics Concern  ? Not on file  ?Social History Narrative  ? Not on file  ? ?Social Determinants of Health  ? ?Financial Resource Strain: Not on file  ?Food Insecurity: Not on file  ?Transportation Needs: Not on file  ?Physical Activity: Not on file  ?Stress: Not on file  ?Social Connections: Not  on file  ? ? ?Review of Systems ?Per HPI ? ?Objective:  ?BP 122/84   Temp 98.2 ?F (36.8 ?C) (Oral)   Ht '5\' 11"'$  (1.803 m)   Wt 256 lb (116.1 kg)   BMI 35.70 kg/m?  ? ? ?  04/20/2022  ?  4:06 PM 11/16/2021  ?  2:03 PM 09/11/2021  ? 10:48 AM  ?BP/Weight  ?Systolic BP 202 542 706  ?Diastolic BP 84 82 62  ?Wt. (Lbs) 256 256.8   ?BMI 35.7 kg/m2 35.82 kg/m2   ? ? ?Physical Exam ?Vitals and nursing note reviewed.  ?Constitutional:   ?   General: He is not in acute distress. ?   Appearance: Normal appearance. He is obese.  ?HENT:  ?   Head: Normocephalic and atraumatic.  ?   Right Ear: Tympanic membrane normal.  ?   Left Ear: Tympanic membrane normal.  ?   Mouth/Throat:  ?   Pharynx: Posterior oropharyngeal erythema present. No oropharyngeal exudate.  ?Cardiovascular:  ?   Rate and Rhythm: Normal rate and regular rhythm.  ?   Heart sounds: No murmur heard. ?Pulmonary:  ?   Effort: Pulmonary effort is normal.  ?   Breath sounds: Normal breath sounds. No wheezing, rhonchi or rales.  ?Neurological:  ?   Mental Status: He is alert.  ? ? ?Lab Results  ?  Component Value Date  ? WBC 9.9 07/14/2021  ? HGB 17.5 (H) 07/14/2021  ? HCT 50.3 (H) 07/14/2021  ? PLT 308 07/14/2021  ? GLUCOSE 95 07/30/2021  ? CHOL 178 03/31/2021  ? TRIG 144 03/31/2021  ? HDL 34 (L) 03/31/2021  ? LDLCALC 118 (H) 03/31/2021  ? ALT 56 (H) 07/30/2021  ? AST 33 07/30/2021  ? NA 140 07/30/2021  ? K 4.0 07/30/2021  ? CL 103 07/30/2021  ? CREATININE 1.08 07/30/2021  ? BUN 13 07/30/2021  ? CO2 30 07/30/2021  ? ? ? ?Assessment & Plan:  ? ?Problem List Items Addressed This Visit   ? ?  ? Respiratory  ? Tonsillitis  ?  Referring to ENT.  Covering with Omnicef. ? ?  ?  ? Relevant Medications  ? cefdinir (OMNICEF) 300 MG capsule  ? Other Relevant Orders  ? Ambulatory referral to ENT  ? ? ?Meds ordered this encounter  ?Medications  ? cefdinir (OMNICEF) 300 MG capsule  ?  Sig: Take 1 capsule (300 mg total) by mouth 2 (two) times daily.  ?  Dispense:  20 capsule  ?  Refill:   0  ? ?Thersa Salt DO ?[place] ? ?

## 2022-04-21 NOTE — Assessment & Plan Note (Signed)
Referring to ENT.  Covering with Omnicef. ?

## 2022-05-17 ENCOUNTER — Encounter: Payer: Self-pay | Admitting: Family Medicine

## 2022-05-17 ENCOUNTER — Ambulatory Visit: Payer: BC Managed Care – PPO | Admitting: Family Medicine

## 2022-07-19 ENCOUNTER — Other Ambulatory Visit (HOSPITAL_COMMUNITY): Payer: BC Managed Care – PPO

## 2022-07-26 ENCOUNTER — Ambulatory Visit: Payer: BC Managed Care – PPO | Admitting: Urology

## 2022-08-26 ENCOUNTER — Other Ambulatory Visit: Payer: Self-pay

## 2022-08-26 DIAGNOSIS — N200 Calculus of kidney: Secondary | ICD-10-CM

## 2022-08-27 ENCOUNTER — Ambulatory Visit: Payer: BC Managed Care – PPO | Admitting: Urology

## 2022-08-30 ENCOUNTER — Ambulatory Visit (HOSPITAL_COMMUNITY)
Admission: RE | Admit: 2022-08-30 | Discharge: 2022-08-30 | Disposition: A | Discharge status: Home / Self Care | Payer: BC Managed Care – PPO | Source: Ambulatory Visit | Attending: Urology | Admitting: Urology

## 2022-08-30 DIAGNOSIS — N281 Cyst of kidney, acquired: Secondary | ICD-10-CM | POA: Diagnosis not present

## 2022-08-30 DIAGNOSIS — Z87442 Personal history of urinary calculi: Secondary | ICD-10-CM | POA: Diagnosis not present

## 2022-08-30 DIAGNOSIS — N200 Calculus of kidney: Secondary | ICD-10-CM | POA: Diagnosis not present

## 2022-08-30 DIAGNOSIS — K760 Fatty (change of) liver, not elsewhere classified: Secondary | ICD-10-CM | POA: Diagnosis not present

## 2022-09-06 ENCOUNTER — Ambulatory Visit: Payer: BC Managed Care – PPO | Admitting: Urology

## 2022-09-06 ENCOUNTER — Encounter: Payer: Self-pay | Admitting: Urology

## 2022-09-06 VITALS — BP 124/74 | HR 69

## 2022-09-06 DIAGNOSIS — Z87442 Personal history of urinary calculi: Secondary | ICD-10-CM | POA: Diagnosis not present

## 2022-09-06 DIAGNOSIS — M1009 Idiopathic gout, multiple sites: Secondary | ICD-10-CM | POA: Diagnosis not present

## 2022-09-06 DIAGNOSIS — N200 Calculus of kidney: Secondary | ICD-10-CM

## 2022-09-06 MED ORDER — POTASSIUM CITRATE ER 15 MEQ (1620 MG) PO TBCR: 1.0000 | EXTENDED_RELEASE_TABLET | Freq: Two times a day (BID) | ORAL | 11 refills | Status: DC

## 2022-09-06 MED ORDER — ALLOPURINOL 300 MG PO TABS: 300.0000 mg | ORAL_TABLET | Freq: Every day | ORAL | 3 refills | Status: DC

## 2022-09-06 NOTE — Patient Instructions (Signed)

## 2022-09-06 NOTE — Progress Notes (Unsigned)
09/06/2022 3:19 PM   [name] 02-26-1986 [id number]  Referring provider: Erven Colla, DO [place],  [place] [address]  Followup nephrolithiasis   HPI: Mr Stfort is a 36yo here for followup for nephrolithiasis. He has passed 4 stones in the past year. Renal US 9/18 shows no calculi. No flank pain currently. He is currently on allopurinol. Urine pH is 5.0. no flank pain currently   PMH: Past Medical History:  Diagnosis Date   Acid reflux    Arthritis    Complication of anesthesia    hard to wake up after anesthesia   Elevated LFTs    mildly elevated transaminases in past, now normalized    GERD (gastroesophageal reflux disease)    Gout    History of kidney stones    Hypertension     Surgical History: Past Surgical History:  Procedure Laterality Date   APPENDECTOMY  01/27/2015   BIOPSY  12/10/2019   Procedure: BIOPSY;  Surgeon: Rogene Houston, MD;  [facility];  Service: Endoscopy;;  esophagus gastric   CHOLECYSTECTOMY     COLONOSCOPY WITH ESOPHAGOGASTRODUODENOSCOPY (EGD) N/A 02/25/2014   NL TI, 2 SIMPLE ADENOMAS(1:>1 CM), LGE IH-FG POLYPS, PROMINENT AMPULLA. Needs Colonoscopy surveillance in 2018   COLONOSCOPY WITH PROPOFOL N/A 09/11/2021   Procedure: COLONOSCOPY WITH PROPOFOL;  Surgeon: Harvel Quale, MD;  [facility];  Service: Gastroenterology;  Laterality: N/A;  10:35   ESOPHAGEAL DILATION N/A 08/30/2018   Procedure: ESOPHAGEAL DILATION;  Surgeon: Rogene Houston, MD;  [facility];  Service: Endoscopy;  Laterality: N/A;   ESOPHAGEAL DILATION N/A 12/10/2019   Procedure: ESOPHAGEAL DILATION;  Surgeon: Rogene Houston, MD;  [facility];  Service: Endoscopy;  Laterality: N/A;   ESOPHAGOGASTRODUODENOSCOPY N/A 04/29/2015   Dr. Oneida Alar: 1. mild non-erosive gastritis (inflammation) was found in the gastric antrum. 2. Prominent Ampullla 70mx 627m benign path with pyloric metaplasia    ESOPHAGOGASTRODUODENOSCOPY N/A 07/12/2016   Procedure: ESOPHAGOGASTRODUODENOSCOPY (EGD);  Surgeon: SaDanie BinderMD;  [facility];  Service: Endoscopy;  Laterality: N/A;  830   ESOPHAGOGASTRODUODENOSCOPY N/A 08/30/2018   Procedure: ESOPHAGOGASTRODUODENOSCOPY (EGD);  Surgeon: ReRogene HoustonMD;  [facility];  Service: Endoscopy;  Laterality: N/A;  2:00   ESOPHAGOGASTRODUODENOSCOPY (EGD) WITH PROPOFOL N/A 12/10/2019   Procedure: ESOPHAGOGASTRODUODENOSCOPY (EGD) WITH PROPOFOL;  Surgeon: ReRogene HoustonMD;  [facility];  Service: Endoscopy;  Laterality: N/A;  12:55   EUS N/A 03/20/2014   Dr. OuPaulita Fujitaprominent major papilla s/p biopsy, minor papilla without clear adenomatous or mass-like appearance, chronic duodenitis on path.    INCISIONAL HERNIA REPAIR N/A 05/02/2019   Procedure: HERNIA REPAIR OPEN INCISIONAL WITH MESH;  Surgeon: BrVirl CageyMD;  [facility]: [facility];  Service: General;  Laterality: N/A;   KIDNEY STONE SURGERY  age [age] age 89 lithotripsy, stent   POLYPECTOMY  12/10/2019   Procedure: POLYPECTOMY;  Surgeon: ReRogene HoustonMD;  [facility];  Service: Endoscopy;;  gastric   POLYPECTOMY  09/11/2021   Procedure: POLYPECTOMY INTESTINAL;  Surgeon: CaHarvel QualeMD;  [facility];  Service: Gastroenterology;;   SAAzzie AlmasILATION N/A 07/12/2016   Procedure: SAAzzie AlmasILATION;  Surgeon: SaDanie BinderMD;  [facility];  Service: Endoscopy;  Laterality: N/A;   VENTRAL HERNIA REPAIR N/A 11/28/2019   Procedure: LAPAROSCOPIC VENTRAL HERNIA WITH MESH;  Surgeon: BrVirl CageyMD;  [facility];  Service: General;  Laterality: N/A;   WISDOM TOOTH EXTRACTION      Home Medications:  Allergies as of 09/06/2022       Reactions   Phenergan [promethazine Hcl] Swelling   arn swelled and hurt for 3 days, pt would rather not have        Medication List        Accurate as of September 06, 2022  3:19 PM. If you have any questions, ask your nurse or doctor.          allopurinol 300 MG tablet Commonly known as: ZYLOPRIM Take 1 tablet (300 mg total) by mouth at bedtime.   amLODipine 5 MG tablet Commonly known as: NORVASC Take 1 tablet (5 mg total) by mouth at bedtime.   cefdinir 300 MG capsule Commonly known as: OMNICEF Take 1 capsule (300 mg total) by mouth 2 (two) times daily.   pantoprazole 40 MG tablet Commonly known as: PROTONIX Take 1 tablet (40 mg total) by mouth daily.        Allergies:  Allergies  Allergen Reactions   Phenergan [Promethazine Hcl] Swelling    arn swelled and hurt for 3 days, pt would rather not have    Family History: Family History  Problem Relation Age of Onset   Diabetes Father    COPD Father    Colon cancer Neg Hx        does not know paternal side    Social History:  reports that he has never smoked. He has never used smokeless tobacco. He reports that he does not drink alcohol and does not use drugs.  ROS: All other review of systems were reviewed and are negative except what is noted above in HPI  Physical Exam: BP 124/74   Pulse 69   Constitutional:  Alert and oriented, No acute distress. HEENT: [place] AT, moist mucus membranes.  Trachea midline, no masses. Cardiovascular: No clubbing, cyanosis, or edema. Respiratory: Normal respiratory effort, no increased work of breathing. GI: Abdomen is soft, nontender, nondistended, no abdominal masses GU: No CVA tenderness.  Lymph: No cervical or inguinal lymphadenopathy. Skin: No rashes, bruises or suspicious lesions. Neurologic: Grossly intact, no focal deficits, moving all 4 extremities. Psychiatric: Normal mood and affect.  Laboratory Data: Lab Results  Component Value Date   WBC 9.9 07/14/2021   HGB 17.5 (H) 07/14/2021   HCT 50.3 (H) 07/14/2021   MCV 88.4 07/14/2021   PLT 308 07/14/2021    Lab Results  Component Value Date   CREATININE 1.08 07/30/2021    No  results found for: "PSA"  No results found for: "TESTOSTERONE"  No results found for: "HGBA1C"  Urinalysis    Component Value Date/Time   COLORURINE YELLOW 02/24/2009 0912   APPEARANCEUR Clear 08/21/2021 1458   LABSPEC >1.030 (H) 02/24/2009 0912   PHURINE 6.0 02/24/2009 0912   GLUCOSEU Negative 08/21/2021 1458   HGBUR LARGE (A) 02/24/2009 0912   BILIRUBINUR Negative 08/21/2021 [place] 02/24/2009 0912   PROTEINUR Negative 08/21/2021 1458   PROTEINUR 30 (A) 02/24/2009 0912   UROBILINOGEN negative (A) 01/30/2020 1421   UROBILINOGEN 0.2 02/24/2009 0912   NITRITE Negative 08/21/2021 1458   NITRITE NEGATIVE 02/24/2009 0912   LEUKOCYTESUR Negative 08/21/2021 1458    Lab Results  Component Value Date   LABMICR Comment 08/21/2021    Pertinent Imaging: Renal US 08/30/2022: Images reviewed and discussed with the patient Results for orders placed during the hospital encounter of  06/25/05  DG Abd 1 View  Narrative Clinical Data: Kidney stone. ABDOMEN - ONE VIEW: Findings: Approximate 2 x 3 mm calcific density projects over the region of the upper pole of the right kidney. This is also noted on 04/16/05 plain film and is compatible with a small calculus. No definite calcifications along the distribution of the ureters.  Impression Small right renal calculus.  Provider: Briscoe Burns  No results found for this or any previous visit.  No results found for this or any previous visit.  No results found for this or any previous visit.  Results for orders placed in visit on 08/26/22  US RENAL  Narrative CLINICAL DATA:  kidney stone  EXAM: RENAL / URINARY TRACT ULTRASOUND COMPLETE  COMPARISON:  July 15, 2021  FINDINGS: Right Kidney:  Renal measurements: 12.3 x 5.8 x 5.9 cm = volume: 222 mL. Echogenicity within normal limits. No mass or hydronephrosis visualized.  Left Kidney:  Renal measurements: 13.2 x 5.5 x 5.3 cm = volume: 200 mL. Echogenicity  within normal limits. No suspicious mass or hydronephrosis visualized. There is incidental note of a 12 mm benign exophytic cyst (for which no dedicated imaging follow-up is recommended).  Bladder:  Appears normal for degree of bladder distention.  Other:  Increased hepatic echogenicity.  IMPRESSION: 1. No hydronephrosis. 2. Hepatic steatosis.   Electronically Signed By: Valentino Saxon M.D. On: 08/30/2022 17:06  No valid procedures specified. No results found for this or any previous visit.  No results found for this or any previous visit.   Assessment & Plan:    1. Kidney stones -Refill allopurinol -Start potassium citrate 46mq BID -RTC 2 weeks for BMP -RTC 6 months with renal [place]- Urinalysis, Routine w reflex microscopic   No follow-ups on file.  PNicolette Bang MD  [facility] [place]

## 2022-09-07 LAB — URINALYSIS, ROUTINE W REFLEX MICROSCOPIC
Bilirubin, UA: NEGATIVE
Glucose, UA: NEGATIVE
Ketones, UA: NEGATIVE
Leukocytes,UA: NEGATIVE
Nitrite, UA: NEGATIVE
Protein,UA: NEGATIVE
RBC, UA: NEGATIVE
Specific Gravity, UA: 1.025 (ref 1.005–1.030)
Urobilinogen, Ur: 0.2 mg/dL (ref 0.2–1.0)
pH, UA: 5 (ref 5.0–7.5)

## 2022-09-20 ENCOUNTER — Other Ambulatory Visit: Payer: BC Managed Care – PPO

## 2022-09-20 DIAGNOSIS — N200 Calculus of kidney: Secondary | ICD-10-CM | POA: Diagnosis not present

## 2022-09-21 LAB — BASIC METABOLIC PANEL
BUN/Creatinine Ratio: 11 (ref 9–20)
BUN: 12 mg/dL (ref 6–20)
CO2: 19 mmol/L — ABNORMAL LOW (ref 20–29)
Calcium: 9.7 mg/dL (ref 8.7–10.2)
Chloride: 103 mmol/L (ref 96–106)
Creatinine, Ser: 1.1 mg/dL (ref 0.76–1.27)
Glucose: 89 mg/dL (ref 70–99)
Potassium: 4.2 mmol/L (ref 3.5–5.2)
Sodium: 142 mmol/L (ref 134–144)
eGFR: 90 mL/min/{1.73_m2} (ref >59)

## 2022-09-27 ENCOUNTER — Telehealth: Payer: Self-pay

## 2022-09-27 NOTE — Telephone Encounter (Signed)
Patient left a voice message: Needing recent lab results.  Please advise.  Return call [phone]  Thanks, [name]

## 2022-09-27 NOTE — Telephone Encounter (Signed)
Patient states that since starting the medication he has been having muscle cramps in his legs and arms.  Do you have any recommendations? He is also requesting an update on his lab work he had done on 10/09.  Please advise.

## 2022-10-04 NOTE — Telephone Encounter (Signed)
Please see message below and advise.

## 2022-10-08 NOTE — Telephone Encounter (Signed)
Patient advised via mychart.

## 2022-11-01 ENCOUNTER — Encounter (INDEPENDENT_AMBULATORY_CARE_PROVIDER_SITE_OTHER): Payer: Self-pay | Admitting: Gastroenterology

## 2022-11-29 ENCOUNTER — Other Ambulatory Visit: Payer: Self-pay

## 2022-11-29 DIAGNOSIS — K219 Gastro-esophageal reflux disease without esophagitis: Secondary | ICD-10-CM

## 2022-11-29 DIAGNOSIS — I10 Essential (primary) hypertension: Secondary | ICD-10-CM

## 2022-11-29 MED ORDER — AMLODIPINE BESYLATE 5 MG PO TABS: 5.0000 mg | ORAL_TABLET | Freq: Every day | ORAL | 0 refills | Status: DC

## 2022-11-29 MED ORDER — PANTOPRAZOLE SODIUM 40 MG PO TBEC: 40.0000 mg | DELAYED_RELEASE_TABLET | Freq: Every day | ORAL | 0 refills | Status: DC

## 2022-12-23 ENCOUNTER — Ambulatory Visit (INDEPENDENT_AMBULATORY_CARE_PROVIDER_SITE_OTHER): Payer: BC Managed Care – PPO | Admitting: Gastroenterology

## 2022-12-23 ENCOUNTER — Encounter (INDEPENDENT_AMBULATORY_CARE_PROVIDER_SITE_OTHER): Payer: Self-pay | Admitting: Gastroenterology

## 2022-12-23 VITALS — BP 132/78 | HR 108 | Temp 98.9°F | Ht 71.0 in | Wt 256.5 lb

## 2022-12-23 DIAGNOSIS — K921 Melena: Secondary | ICD-10-CM | POA: Diagnosis not present

## 2022-12-23 DIAGNOSIS — R1033 Periumbilical pain: Secondary | ICD-10-CM

## 2022-12-23 DIAGNOSIS — R197 Diarrhea, unspecified: Secondary | ICD-10-CM

## 2022-12-23 NOTE — Patient Instructions (Signed)
Let's check some stool studies to rule out infection  Continue to stay well hydrated, can try doing the BRAT diet as well to help with diarrhea Avoid imodium for now until infection is ruled out  I will be in touch regarding further recommendations after stool testing has resulted

## 2022-12-23 NOTE — Progress Notes (Addendum)
Referring Provider: Coral Spikes, DO Primary Care Physician:  Coral Spikes, DO Primary GI Physician: previously rehman   Chief Complaint  Patient presents with   Abdominal Pain    Patient here today due to abdominal pain, and diarrhea after visiting [place] over Christmas. No fevers, has seen bright red blood in stools. He says he does have a history of polyps and of hemorrhoids.    HPI:   [name] is a 37 y.o. male with past medical history of acid reflux, arthritis, GED, gout, kidney stones, HTN  Patient presenting today for abdominal pain and diarrhea.   Patient states that he went to [place] over [place], the day after he began having abdominal pain, diarrhea, some nausea. Stools are watery, worse after eating. He reports upwards of 6-8 episodes of diarrhea per day. No vomiting. He reports previous history of some rectal bleeding, started noticing this again when acute illness began with blood in the toilet. Noone else in his family has been sick. He is not taking anything for abdominal discomfortdiarrhea, he is having more abdominal cramping on occasion when he eats now. Denies any black colored stools. No fevers or chills. Denies any medication changes or abx therapy prior to symptoms beginning. He reports they bought bottled water and even brushed their teeth with it, family ate the same stuff that he did.   Reports that BMs were regular prior to becoming ill in December.   Last Colonoscopy:08/2021  - Hemorrhoids found on perianal exam. - Three 4 to 5 mm polyps in the descending colon-2 TAs   - A tattoo was seen in the distal sigmoid colon.  The tattoo site appeared normal. - The distal rectum and anal verge are normal on retroflexion view. Last Endoscopy:12/20 - Normal esophagus. Biopsied.  - Z-line irregular, 38 cm from the incisors. - 2 cm hiatal hernia.  - No endoscopic esophageal abnormality to explain patient's dysphagia. Esophagus dilated. Biopsied. - Multiple  gastric polyps. Biopsied.(negative for H. pylori infection. Gastric polyps are fundic gland polyps.Esophageal biopsy shows changes of GERD and no EoE.)  - Nodular mucosa in the prepyloric region of the stomach. Biopsied. - Normal duodenal bulb and second portion of the duodenum.  Recommendations:  Repeat colonoscopy in 2023  Past Medical History:  Diagnosis Date   Acid reflux    Arthritis    Complication of anesthesia    hard to wake up after anesthesia   Elevated LFTs    mildly elevated transaminases in past, now normalized    GERD (gastroesophageal reflux disease)    Gout    History of kidney stones    Hypertension     Past Surgical History:  Procedure Laterality Date   APPENDECTOMY  01/27/2015   BIOPSY  12/10/2019   Procedure: BIOPSY;  Surgeon: Rogene Houston, MD;  [facility];  Service: Endoscopy;;  esophagus gastric   CHOLECYSTECTOMY     COLONOSCOPY WITH ESOPHAGOGASTRODUODENOSCOPY (EGD) N/A 02/25/2014   NL TI, 2 SIMPLE ADENOMAS(1:>1 CM), LGE IH-FG POLYPS, PROMINENT AMPULLA. Needs Colonoscopy surveillance in 2018   COLONOSCOPY WITH PROPOFOL N/A 09/11/2021   Procedure: COLONOSCOPY WITH PROPOFOL;  Surgeon: Harvel Quale, MD;  [facility];  Service: Gastroenterology;  Laterality: N/A;  10:35   ESOPHAGEAL DILATION N/A 08/30/2018   Procedure: ESOPHAGEAL DILATION;  Surgeon: Rogene Houston, MD;  [facility];  Service: Endoscopy;  Laterality: N/A;   ESOPHAGEAL DILATION N/A 12/10/2019   Procedure: ESOPHAGEAL DILATION;  Surgeon:  Rogene Houston, MD;  [facility];  Service: Endoscopy;  Laterality: N/A;   ESOPHAGOGASTRODUODENOSCOPY N/A 04/29/2015   Dr. Oneida Alar: 1. mild non-erosive gastritis (inflammation) was found in the gastric antrum. 2. Prominent Ampullla 45mx 622m benign path with pyloric metaplasia   ESOPHAGOGASTRODUODENOSCOPY N/A 07/12/2016   Procedure: ESOPHAGOGASTRODUODENOSCOPY (EGD);  Surgeon: SaDanie BinderMD;   [facility];  Service: Endoscopy;  Laterality: N/A;  830   ESOPHAGOGASTRODUODENOSCOPY N/A 08/30/2018   Procedure: ESOPHAGOGASTRODUODENOSCOPY (EGD);  Surgeon: ReRogene HoustonMD;  [facility];  Service: Endoscopy;  Laterality: N/A;  2:00   ESOPHAGOGASTRODUODENOSCOPY (EGD) WITH PROPOFOL N/A 12/10/2019   Procedure: ESOPHAGOGASTRODUODENOSCOPY (EGD) WITH PROPOFOL;  Surgeon: ReRogene HoustonMD;  [facility];  Service: Endoscopy;  Laterality: N/A;  12:55   EUS N/A 03/20/2014   Dr. OuPaulita Fujitaprominent major papilla s/p biopsy, minor papilla without clear adenomatous or mass-like appearance, chronic duodenitis on path.    INCISIONAL HERNIA REPAIR N/A 05/02/2019   Procedure: HERNIA REPAIR OPEN INCISIONAL WITH MESH;  Surgeon: BrVirl CageyMD;  [facility];  Service: General;  Laterality: N/A;   KIDNEY STONE SURGERY  age [age] age 37 lithotripsy, stent   POLYPECTOMY  12/10/2019   Procedure: POLYPECTOMY;  Surgeon: ReRogene HoustonMD;  [facility];  Service: Endoscopy;;  gastric   POLYPECTOMY  09/11/2021   Procedure: POLYPECTOMY INTESTINAL;  Surgeon: CaHarvel QualeMD;  [facility];  Service: Gastroenterology;;   SAAzzie AlmasILATION N/A 07/12/2016   Procedure: SAAzzie AlmasILATION;  Surgeon: SaDanie BinderMD;  [facility];  Service: Endoscopy;  Laterality: N/A;   VENTRAL HERNIA REPAIR N/A 11/28/2019   Procedure: LAPAROSCOPIC VENTRAL HERNIA WITH MESH;  Surgeon: BrVirl CageyMD;  [facility];  Service: General;  Laterality: N/A;   WISDOM TOOTH EXTRACTION      Current Outpatient Medications  Medication Sig Dispense Refill   allopurinol (ZYLOPRIM) 300 MG tablet Take 1 tablet (300 mg total) by mouth at bedtime. 90 tablet 3   amLODipine (NORVASC) 5 MG tablet Take 1 tablet (5 mg total) by mouth at bedtime. 90 tablet 0   pantoprazole (PROTONIX) 40 MG tablet Take 1 tablet (40 mg total) by mouth daily. 90 tablet 0    Potassium Citrate (UROCIT-K 15) 15 MEQ (1620 MG) TBCR Take 1 tablet by mouth in the morning and at bedtime. 60 tablet 11   No current facility-administered medications for this visit.    Allergies as of 12/23/2022 - Review Complete 12/23/2022  Allergen Reaction Noted   Phenergan [promethazine hcl] Swelling 03/13/2014    Family History  Problem Relation Age of Onset   Diabetes Father    COPD Father    Colon cancer Neg Hx        does not know paternal side    Social History   Socioeconomic History   Marital status: Married    Spouse name: Not on file   Number of children: 3   Years of education: Not on file   Highest education level: Not on file  Occupational History   Occupation: CoAgricultural consultant[employer]  Tobacco Use   Smoking status: Never   Smokeless tobacco: Never  Vaping Use   Vaping Use: Never used  Substance and Sexual Activity   Alcohol use: No   Drug use: No   Sexual activity: Not on file  Other Topics Concern  Not on file  Social History Narrative   Not on file   Social Determinants of Health   Financial Resource Strain: Not on file  Food Insecurity: Not on file  Transportation Needs: Not on file  Physical Activity: Not on file  Stress: Not on file  Social Connections: Not on file   Review of systems General: negative for malaise, night sweats, fever, chills, weight loss Neck: Negative for lumps, goiter, pain and significant neck swelling Resp: Negative for cough, wheezing, dyspnea at rest CV: Negative for chest pain, leg swelling, palpitations, orthopnea GI: denies melena, vomiting, constipation, dysphagia, odyonophagia, early satiety. +diarrhea +abdominal cramping +nausea +rectal bleeding MSK: Negative for joint pain or swelling, back pain, and muscle pain. Derm: Negative for itching or rash Psych: Denies depression, anxiety, memory loss, confusion. No homicidal or suicidal ideation.  Heme: Negative for prolonged bleeding,  bruising easily, and swollen nodes. Endocrine: Negative for cold or heat intolerance, polyuria, polydipsia and goiter. Neuro: negative for tremor, gait imbalance, syncope and seizures. The remainder of the review of systems is noncontributory.  Physical Exam: BP 132/78 (BP Location: Left Arm, Patient Position: Sitting, Cuff Size: Large)   Pulse (!) 108   Temp 98.9 F (37.2 C) (Temporal)   Ht '5\' 11"'$  (1.803 m)   Wt 256 lb 8 oz (116.3 kg)   BMI 35.77 kg/m  General:   Alert and oriented. No distress noted. Pleasant and cooperative.  Head:  Normocephalic and atraumatic. Eyes:  Conjuctiva clear without scleral icterus. Mouth:  Oral mucosa pink and moist. Good dentition. No lesions. Heart: Normal rate and rhythm, s1 and s2 heart sounds present.  Lungs: Clear lung sounds in all lobes. Respirations equal and unlabored. Abdomen:  +BS, soft, non-tender and non-distended. No rebound or guarding. No HSM or masses noted. Derm: No palmar erythema or jaundice Msk:  Symmetrical without gross deformities. Normal posture. Extremities:  Without edema. Neurologic:  Alert and  oriented x4 Psych:  Alert and cooperative. Normal mood and affect.  Invalid input(s): "6 MONTHS"   ASSESSMENT: [name] is a 37 y.o. male presenting today for diarrhea, rectal bleeding and abdominal cramping  New onset postprandial diarrhea, rectal bleeding, abdominal cramping following trip to [place] at the end of December.  Also with some nausea.  History of hemorrhoids in the past most recent colonoscopy was 2022.  Abdominal exam today is unremarkable.  He is not taking anything for abdominal cramping or diarrhea.  No one else on the trip with him was sick and he reports avoiding consuming any local water while he was there. Having multiple episodes of watery stools per day, rectal bleeding possibly secondary to known hemorrhoids in setting of multiple stools. Will check stool studies to rule out most obvious likely cause  of symptoms as infection/parasite. May consider colonoscopy if stool studies are negative and symptoms persist. For now he should stay well hydrated, avoid imodium and can do the [employer].    PLAN:  GI pathogen panel, O&P  2. Consider colonoscopy if stool studies negative/symptoms persist  3. Avoid imodium 4. Stay well hydrated, BRAT diet   All questions were answered, patient verbalized understanding and is in agreement with plan as outlined above.    Follow Up: TBD  Tamyra Fojtik L. Alver Sorrow, MSN, APRN, AGNP-C Adult-Gerontology Nurse Practitioner [facility]  I have reviewed the note and agree with the APP's assessment as described in this progress note  Maylon Peppers, MD Gastroenterology and Hepatology [facility]astroenterology

## 2023-01-10 DIAGNOSIS — Z20822 Contact with and (suspected) exposure to covid-19: Secondary | ICD-10-CM | POA: Diagnosis not present

## 2023-01-10 DIAGNOSIS — J111 Influenza due to unidentified influenza virus with other respiratory manifestations: Secondary | ICD-10-CM | POA: Diagnosis not present

## 2023-01-10 DIAGNOSIS — R509 Fever, unspecified: Secondary | ICD-10-CM | POA: Diagnosis not present

## 2023-02-28 ENCOUNTER — Ambulatory Visit (HOSPITAL_COMMUNITY): Payer: BC Managed Care – PPO

## 2023-03-01 ENCOUNTER — Other Ambulatory Visit: Payer: Self-pay | Admitting: Family Medicine

## 2023-03-01 DIAGNOSIS — I10 Essential (primary) hypertension: Secondary | ICD-10-CM

## 2023-03-01 DIAGNOSIS — K219 Gastro-esophageal reflux disease without esophagitis: Secondary | ICD-10-CM

## 2023-03-01 DIAGNOSIS — M7712 Lateral epicondylitis, left elbow: Secondary | ICD-10-CM | POA: Diagnosis not present

## 2023-03-01 DIAGNOSIS — M25522 Pain in left elbow: Secondary | ICD-10-CM | POA: Diagnosis not present

## 2023-03-07 ENCOUNTER — Ambulatory Visit: Payer: BC Managed Care – PPO | Admitting: Urology

## 2023-03-17 ENCOUNTER — Ambulatory Visit (HOSPITAL_COMMUNITY)
Admission: RE | Admit: 2023-03-17 | Discharge: 2023-03-17 | Disposition: A | Discharge status: Home / Self Care | Payer: BC Managed Care – PPO | Source: Ambulatory Visit | Attending: Urology | Admitting: Urology

## 2023-03-17 DIAGNOSIS — N200 Calculus of kidney: Secondary | ICD-10-CM | POA: Diagnosis not present

## 2023-03-25 ENCOUNTER — Ambulatory Visit: Payer: BC Managed Care – PPO | Admitting: Urology

## 2023-03-25 VITALS — BP 120/76 | HR 64

## 2023-03-25 DIAGNOSIS — Z09 Encounter for follow-up examination after completed treatment for conditions other than malignant neoplasm: Secondary | ICD-10-CM | POA: Diagnosis not present

## 2023-03-25 DIAGNOSIS — Z87442 Personal history of urinary calculi: Secondary | ICD-10-CM

## 2023-03-25 DIAGNOSIS — N200 Calculus of kidney: Secondary | ICD-10-CM

## 2023-03-25 DIAGNOSIS — I10 Essential (primary) hypertension: Secondary | ICD-10-CM | POA: Diagnosis not present

## 2023-03-25 DIAGNOSIS — Z6841 Body Mass Index (BMI) 40.0 and over, adult: Secondary | ICD-10-CM | POA: Diagnosis not present

## 2023-03-25 DIAGNOSIS — J0391 Acute recurrent tonsillitis, unspecified: Secondary | ICD-10-CM | POA: Insufficient documentation

## 2023-03-25 DIAGNOSIS — M1009 Idiopathic gout, multiple sites: Secondary | ICD-10-CM

## 2023-03-25 DIAGNOSIS — J358 Other chronic diseases of tonsils and adenoids: Secondary | ICD-10-CM | POA: Insufficient documentation

## 2023-03-25 LAB — URINALYSIS, ROUTINE W REFLEX MICROSCOPIC
Bilirubin, UA: NEGATIVE
Glucose, UA: NEGATIVE
Ketones, UA: NEGATIVE
Nitrite, UA: NEGATIVE
Protein,UA: NEGATIVE
RBC, UA: NEGATIVE
Specific Gravity, UA: 1.02 (ref 1.005–1.030)
Urobilinogen, Ur: 0.2 mg/dL (ref 0.2–1.0)
pH, UA: 5 (ref 5.0–7.5)

## 2023-03-25 LAB — MICROSCOPIC EXAMINATION: Bacteria, UA: NONE SEEN

## 2023-03-25 MED ORDER — ALLOPURINOL 300 MG PO TABS: 300.0000 mg | ORAL_TABLET | Freq: Every day | ORAL | 3 refills | Status: DC

## 2023-03-25 MED ORDER — POTASSIUM CITRATE ER 15 MEQ (1620 MG) PO TBCR: 1.0000 | EXTENDED_RELEASE_TABLET | Freq: Two times a day (BID) | ORAL | 11 refills | Status: DC

## 2023-03-25 NOTE — Progress Notes (Unsigned)
03/25/2023 12:33 PM   [name] 01/22/1986 [id number]  Referring provider: Tommie Sams, DO [address] [place],  [place] [address]  No chief complaint on file.   HPI: He drinks 4-5 mountain dews daily. He passed 3 small calculi since last visit. Renal US shows no calculi.   PMH: Past Medical History:  Diagnosis Date   Acid reflux    Arthritis    Complication of anesthesia    hard to wake up after anesthesia   Elevated LFTs    mildly elevated transaminases in past, now normalized    GERD (gastroesophageal reflux disease)    Gout    History of kidney stones    Hypertension     Surgical History: Past Surgical History:  Procedure Laterality Date   APPENDECTOMY  01/27/2015   BIOPSY  12/10/2019   Procedure: BIOPSY;  Surgeon: Malissa Hippo, MD;  [facility];  Service: Endoscopy;;  esophagus gastric   CHOLECYSTECTOMY     COLONOSCOPY WITH ESOPHAGOGASTRODUODENOSCOPY (EGD) N/A 02/25/2014   NL TI, 2 SIMPLE ADENOMAS(1:>1 CM), LGE IH-FG POLYPS, PROMINENT AMPULLA. Needs Colonoscopy surveillance in 2018   COLONOSCOPY WITH PROPOFOL N/A 09/11/2021   Procedure: COLONOSCOPY WITH PROPOFOL;  Surgeon: Dolores Frame, MD;  [facility];  Service: Gastroenterology;  Laterality: N/A;  10:35   ESOPHAGEAL DILATION N/A 08/30/2018   Procedure: ESOPHAGEAL DILATION;  Surgeon: Malissa Hippo, MD;  [facility];  Service: Endoscopy;  Laterality: N/A;   ESOPHAGEAL DILATION N/A 12/10/2019   Procedure: ESOPHAGEAL DILATION;  Surgeon: Malissa Hippo, MD;  [facility];  Service: Endoscopy;  Laterality: N/A;   ESOPHAGOGASTRODUODENOSCOPY N/A 04/29/2015   Dr. Darrick Penna: 1. mild non-erosive gastritis (inflammation) was found in the gastric antrum. 2. Prominent Ampullla 6mm. benign path with pyloric metaplasia   ESOPHAGOGASTRODUODENOSCOPY N/A 07/12/2016   Procedure: ESOPHAGOGASTRODUODENOSCOPY (EGD);  Surgeon: West Bali, MD;   [facility];  Service: Endoscopy;  Laterality: N/A;  830   ESOPHAGOGASTRODUODENOSCOPY N/A 08/30/2018   Procedure: ESOPHAGOGASTRODUODENOSCOPY (EGD);  Surgeon: Malissa Hippo, MD;  [facility];  Service: Endoscopy;  Laterality: N/A;  2:00   ESOPHAGOGASTRODUODENOSCOPY (EGD) WITH PROPOFOL N/A 12/10/2019   Procedure: ESOPHAGOGASTRODUODENOSCOPY (EGD) WITH PROPOFOL;  Surgeon: Malissa Hippo, MD;  [facility];  Service: Endoscopy;  Laterality: N/A;  12:55   EUS N/A 03/20/2014   Dr. Dulce Sellar: prominent major papilla s/p biopsy, minor papilla without clear adenomatous or mass-like appearance, chronic duodenitis on path.    INCISIONAL HERNIA REPAIR N/A 05/02/2019   Procedure: HERNIA REPAIR OPEN INCISIONAL WITH MESH;  Surgeon: Lucretia Roers, MD;  Location: [facility];  Service: General;  Laterality: N/A;   KIDNEY STONE SURGERY  age 37 and age 11   lithotripsy, stent   POLYPECTOMY  12/10/2019   Procedure: POLYPECTOMY;  Surgeon: Malissa Hippo, MD;  [facility];  Service: Endoscopy;;  gastric   POLYPECTOMY  09/11/2021   Procedure: POLYPECTOMY INTESTINAL;  Surgeon: Dolores Frame, MD;  [facility];  Service: Gastroenterology;;   Gaspar Bidding DILATION N/A 07/12/2016   Procedure: Gaspar Bidding DILATION;  Surgeon: West Bali, MD;  [facility];  Service: Endoscopy;  Laterality: N/A;   VENTRAL HERNIA REPAIR N/A 11/28/2019   Procedure: LAPAROSCOPIC VENTRAL HERNIA WITH MESH;  Surgeon: Lucretia Roers, MD;  Location: [facility];  Service: General;  Laterality: N/A;   WISDOM TOOTH EXTRACTION  Home Medications:  Allergies as of 03/25/2023       Reactions   Phenergan [promethazine Hcl] Swelling   arn swelled and hurt for 3 days, pt would rather not have        Medication List        Accurate as of March 25, 2023 12:33 PM. If you have any questions, ask your nurse or doctor.          allopurinol 300 MG tablet Commonly known  as: ZYLOPRIM Take 1 tablet (300 mg total) by mouth at bedtime.   amLODipine 5 MG tablet Commonly known as: NORVASC TAKE ONE TABLET BY MOUTH AT BEDTIME   pantoprazole 40 MG tablet Commonly known as: PROTONIX TAKE ONE TABLET BY MOUTH ONCE DAILY   Potassium Citrate 15 MEQ (1620 MG) Tbcr Commonly known as: Urocit-K 15 Take 1 tablet by mouth in the morning and at bedtime.        Allergies:  Allergies  Allergen Reactions   Phenergan [Promethazine Hcl] Swelling    arn swelled and hurt for 3 days, pt would rather not have    Family History: Family History  Problem Relation Age of Onset   Diabetes Father    COPD Father    Colon cancer Neg Hx        does not know paternal side    Social History:  reports that he has never smoked. He has never used smokeless tobacco. He reports that he does not drink alcohol and does not use drugs.  ROS: All other review of systems were reviewed and are negative except what is noted above in HPI  Physical Exam: BP 120/76   Pulse 64   Constitutional:  Alert and oriented, No acute distress. HEENT: [place] AT, moist mucus membranes.  Trachea midline, no masses. Cardiovascular: No clubbing, cyanosis, or edema. Respiratory: Normal respiratory effort, no increased work of breathing. GI: Abdomen is soft, nontender, nondistended, no abdominal masses GU: No CVA tenderness.  Lymph: No cervical or inguinal lymphadenopathy. Skin: No rashes, bruises or suspicious lesions. Neurologic: Grossly intact, no focal deficits, moving all 4 extremities. Psychiatric: Normal mood and affect.  Laboratory Data: Lab Results  Component Value Date   WBC 9.9 07/14/2021   HGB 17.5 (H) 07/14/2021   HCT 50.3 (H) 07/14/2021   MCV 88.4 07/14/2021   PLT 308 07/14/2021    Lab Results  Component Value Date   CREATININE 1.10 09/20/2022    No results found for: "PSA"  No results found for: "TESTOSTERONE"  No results found for: "HGBA1C"  Urinalysis    Component  Value Date/Time   COLORURINE YELLOW 02/24/2009 0912   APPEARANCEUR Hazy (A) 09/06/2022 1521   LABSPEC >1.030 (H) 02/24/2009 0912   PHURINE 6.0 02/24/2009 0912   GLUCOSEU Negative 09/06/2022 1521   HGBUR LARGE (A) 02/24/2009 0912   BILIRUBINUR Negative 09/06/2022 1521   KETONESUR NEGATIVE 02/24/2009 0912   PROTEINUR Negative 09/06/2022 1521   PROTEINUR 30 (A) 02/24/2009 0912   UROBILINOGEN negative (A) 01/30/2020 1421   UROBILINOGEN 0.2 02/24/2009 0912   NITRITE Negative 09/06/2022 1521   NITRITE NEGATIVE 02/24/2009 0912   LEUKOCYTESUR Negative 09/06/2022 1521    Lab Results  Component Value Date   LABMICR Comment 09/06/2022    Pertinent Imaging: *** Results for orders placed during the hospital encounter of 06/25/05  DG Abd 1 View  Narrative Clinical Data: Kidney stone. ABDOMEN - ONE VIEW: Findings: Approximate 2 x 3 mm calcific density projects over the region of the  upper pole of the right kidney. This is also noted on 04/16/05 plain film and is compatible with a small calculus. No definite calcifications along the distribution of the ureters.  Impression Small right renal calculus.  Provider: Kyra Searles  No results found for this or any previous visit.  No results found for this or any previous visit.  No results found for this or any previous visit.  Results for orders placed during the hospital encounter of 03/17/23  Ultrasound renal complete  Narrative CLINICAL DATA:  nephrolithiasis  EXAM: RENAL / URINARY TRACT ULTRASOUND COMPLETE  COMPARISON:  August 30, 2022, September 29, 2020  FINDINGS: Right Kidney:  Renal measurements: 12.0 x 6.2 x 6.1 cm = volume: 234 mL. Echogenicity within normal limits. No mass or hydronephrosis visualized.  Left Kidney:  Renal measurements: 12.8 x 5.2 x 4.7 cm = volume: 164 mL. Echogenicity within normal limits. No suspicious mass or hydronephrosis visualized. Revisualization of a previously characterized cyst  of the LEFT kidney measuring up to 15 mm; on today's exam, it appears more hypoechoic and may contain hemorrhagic or proteinaceous component.  Bladder:  Appears normal for degree of bladder distention.  Other:  Increased hepatic echogenicity.  IMPRESSION: 1. No hydronephrosis. 2. Revisualization of a previously characterized cyst of the LEFT kidney measuring up to 15 mm; on today's exam, it appears more hypoechoic and may contain hemorrhagic or proteinaceous component. Recommend attention on future studies. 3. Hepatic steatosis.   Electronically Signed By: Meda Klinefelter M.D. On: 03/18/2023 17:17  No valid procedures specified. No results found for this or any previous visit.  No results found for this or any previous visit.   Assessment & Plan:    1. Kidney stones -continue urocitK BID -Continue allopurinol   - Urinalysis, Routine w reflex microscopic  2. Idiopathic gout of multiple sites, unspecified chronicity ***   No follow-ups on file.  Wilkie Aye, MD  [facility] [place]

## 2023-03-25 NOTE — Patient Instructions (Signed)

## 2023-03-29 ENCOUNTER — Encounter: Payer: Self-pay | Admitting: Urology

## 2023-05-02 DIAGNOSIS — M546 Pain in thoracic spine: Secondary | ICD-10-CM | POA: Diagnosis not present

## 2023-05-02 DIAGNOSIS — M9903 Segmental and somatic dysfunction of lumbar region: Secondary | ICD-10-CM | POA: Diagnosis not present

## 2023-05-02 DIAGNOSIS — M9902 Segmental and somatic dysfunction of thoracic region: Secondary | ICD-10-CM | POA: Diagnosis not present

## 2023-05-02 DIAGNOSIS — M62830 Muscle spasm of back: Secondary | ICD-10-CM | POA: Diagnosis not present

## 2023-05-12 DIAGNOSIS — M9902 Segmental and somatic dysfunction of thoracic region: Secondary | ICD-10-CM | POA: Diagnosis not present

## 2023-05-12 DIAGNOSIS — M62830 Muscle spasm of back: Secondary | ICD-10-CM | POA: Diagnosis not present

## 2023-05-12 DIAGNOSIS — M9903 Segmental and somatic dysfunction of lumbar region: Secondary | ICD-10-CM | POA: Diagnosis not present

## 2023-05-12 DIAGNOSIS — M546 Pain in thoracic spine: Secondary | ICD-10-CM | POA: Diagnosis not present

## 2023-06-01 ENCOUNTER — Other Ambulatory Visit: Payer: Self-pay | Admitting: Family Medicine

## 2023-06-01 DIAGNOSIS — K219 Gastro-esophageal reflux disease without esophagitis: Secondary | ICD-10-CM

## 2023-06-06 DIAGNOSIS — M62830 Muscle spasm of back: Secondary | ICD-10-CM | POA: Diagnosis not present

## 2023-06-06 DIAGNOSIS — M9903 Segmental and somatic dysfunction of lumbar region: Secondary | ICD-10-CM | POA: Diagnosis not present

## 2023-06-06 DIAGNOSIS — M9902 Segmental and somatic dysfunction of thoracic region: Secondary | ICD-10-CM | POA: Diagnosis not present

## 2023-06-06 DIAGNOSIS — M546 Pain in thoracic spine: Secondary | ICD-10-CM | POA: Diagnosis not present

## 2023-06-07 ENCOUNTER — Other Ambulatory Visit: Payer: Self-pay | Admitting: Family Medicine

## 2023-06-07 DIAGNOSIS — I10 Essential (primary) hypertension: Secondary | ICD-10-CM

## 2023-06-13 DIAGNOSIS — M62830 Muscle spasm of back: Secondary | ICD-10-CM | POA: Diagnosis not present

## 2023-06-13 DIAGNOSIS — M9903 Segmental and somatic dysfunction of lumbar region: Secondary | ICD-10-CM | POA: Diagnosis not present

## 2023-06-13 DIAGNOSIS — M9902 Segmental and somatic dysfunction of thoracic region: Secondary | ICD-10-CM | POA: Diagnosis not present

## 2023-06-13 DIAGNOSIS — M546 Pain in thoracic spine: Secondary | ICD-10-CM | POA: Diagnosis not present

## 2023-06-28 DIAGNOSIS — M545 Low back pain, unspecified: Secondary | ICD-10-CM | POA: Diagnosis not present

## 2023-07-07 DIAGNOSIS — I10 Essential (primary) hypertension: Secondary | ICD-10-CM | POA: Diagnosis not present

## 2023-07-07 DIAGNOSIS — R079 Chest pain, unspecified: Secondary | ICD-10-CM | POA: Diagnosis not present

## 2023-07-07 DIAGNOSIS — R001 Bradycardia, unspecified: Secondary | ICD-10-CM | POA: Diagnosis not present

## 2023-07-07 DIAGNOSIS — R0989 Other specified symptoms and signs involving the circulatory and respiratory systems: Secondary | ICD-10-CM | POA: Diagnosis not present

## 2023-07-07 DIAGNOSIS — E876 Hypokalemia: Secondary | ICD-10-CM | POA: Diagnosis not present

## 2023-07-07 DIAGNOSIS — R072 Precordial pain: Secondary | ICD-10-CM | POA: Diagnosis not present

## 2023-07-07 DIAGNOSIS — M109 Gout, unspecified: Secondary | ICD-10-CM | POA: Diagnosis not present

## 2023-07-07 DIAGNOSIS — R319 Hematuria, unspecified: Secondary | ICD-10-CM | POA: Diagnosis not present

## 2023-07-07 DIAGNOSIS — R9431 Abnormal electrocardiogram [ECG] [EKG]: Secondary | ICD-10-CM | POA: Diagnosis not present

## 2023-07-08 DIAGNOSIS — K219 Gastro-esophageal reflux disease without esophagitis: Secondary | ICD-10-CM | POA: Diagnosis not present

## 2023-07-08 DIAGNOSIS — N132 Hydronephrosis with renal and ureteral calculous obstruction: Secondary | ICD-10-CM | POA: Diagnosis not present

## 2023-07-08 DIAGNOSIS — R109 Unspecified abdominal pain: Secondary | ICD-10-CM | POA: Diagnosis not present

## 2023-07-08 DIAGNOSIS — I10 Essential (primary) hypertension: Secondary | ICD-10-CM | POA: Diagnosis not present

## 2023-07-08 DIAGNOSIS — K760 Fatty (change of) liver, not elsewhere classified: Secondary | ICD-10-CM | POA: Diagnosis not present

## 2023-07-08 DIAGNOSIS — M109 Gout, unspecified: Secondary | ICD-10-CM | POA: Diagnosis not present

## 2023-07-08 DIAGNOSIS — N200 Calculus of kidney: Secondary | ICD-10-CM | POA: Diagnosis not present

## 2023-07-08 DIAGNOSIS — N400 Enlarged prostate without lower urinary tract symptoms: Secondary | ICD-10-CM | POA: Diagnosis not present

## 2023-07-09 ENCOUNTER — Encounter (HOSPITAL_COMMUNITY): Payer: Self-pay

## 2023-07-09 ENCOUNTER — Emergency Department (HOSPITAL_COMMUNITY)
Admission: EM | Admit: 2023-07-09 | Discharge: 2023-07-09 | Disposition: A | Discharge status: Home / Self Care | Payer: BC Managed Care – PPO | Source: Home / Self Care | Attending: Emergency Medicine | Admitting: Emergency Medicine

## 2023-07-09 ENCOUNTER — Other Ambulatory Visit: Payer: Self-pay

## 2023-07-09 DIAGNOSIS — N201 Calculus of ureter: Secondary | ICD-10-CM | POA: Insufficient documentation

## 2023-07-09 DIAGNOSIS — R109 Unspecified abdominal pain: Secondary | ICD-10-CM | POA: Diagnosis not present

## 2023-07-09 DIAGNOSIS — Z79899 Other long term (current) drug therapy: Secondary | ICD-10-CM | POA: Diagnosis not present

## 2023-07-09 DIAGNOSIS — R112 Nausea with vomiting, unspecified: Secondary | ICD-10-CM | POA: Diagnosis not present

## 2023-07-09 DIAGNOSIS — I10 Essential (primary) hypertension: Secondary | ICD-10-CM | POA: Diagnosis not present

## 2023-07-09 DIAGNOSIS — N200 Calculus of kidney: Secondary | ICD-10-CM | POA: Diagnosis not present

## 2023-07-09 LAB — URINALYSIS, ROUTINE W REFLEX MICROSCOPIC
Bacteria, UA: NONE SEEN
Bilirubin Urine: NEGATIVE
Glucose, UA: NEGATIVE mg/dL
Ketones, ur: NEGATIVE mg/dL
Leukocytes,Ua: NEGATIVE
Nitrite: NEGATIVE
Protein, ur: NEGATIVE mg/dL
Specific Gravity, Urine: 1.017 (ref 1.005–1.030)
pH: 6 (ref 5.0–8.0)

## 2023-07-09 LAB — CBC
HCT: 49.1 % (ref 39.0–52.0)
Hemoglobin: 17.1 g/dL — ABNORMAL HIGH (ref 13.0–17.0)
MCH: 31.3 pg (ref 26.0–34.0)
MCHC: 34.8 g/dL (ref 30.0–36.0)
MCV: 89.9 fL (ref 80.0–100.0)
Platelets: 185 10*3/uL (ref 150–400)
RBC: 5.46 MIL/uL (ref 4.22–5.81)
RDW: 13.2 % (ref 11.5–15.5)
WBC: 12.5 10*3/uL — ABNORMAL HIGH (ref 4.0–10.5)
nRBC: 0 % (ref 0.0–0.2)

## 2023-07-09 LAB — BASIC METABOLIC PANEL
Anion gap: 8 (ref 5–15)
BUN: 23 mg/dL — ABNORMAL HIGH (ref 6–20)
CO2: 26 mmol/L (ref 22–32)
Calcium: 8.6 mg/dL — ABNORMAL LOW (ref 8.9–10.3)
Chloride: 100 mmol/L (ref 98–111)
Creatinine, Ser: 1.52 mg/dL — ABNORMAL HIGH (ref 0.61–1.24)
GFR, Estimated: >60 mL/min (ref >60)
Glucose, Bld: 112 mg/dL — ABNORMAL HIGH (ref 70–99)
Potassium: 3.5 mmol/L (ref 3.5–5.1)
Sodium: 134 mmol/L — ABNORMAL LOW (ref 135–145)

## 2023-07-09 MED ORDER — ONDANSETRON HCL 4 MG/2ML IJ SOLN
4.0000 mg | Freq: Once | INTRAMUSCULAR | Status: AC
  Administered 2023-07-09: 4 mg via INTRAVENOUS
  Filled 2023-07-09: qty 2

## 2023-07-09 MED ORDER — KETOROLAC TROMETHAMINE 15 MG/ML IJ SOLN
15.0000 mg | Freq: Once | INTRAMUSCULAR | Status: AC
  Administered 2023-07-09: 15 mg via INTRAVENOUS
  Filled 2023-07-09: qty 1

## 2023-07-09 MED ORDER — OXYCODONE-ACETAMINOPHEN 5-325 MG PO TABS: 1.0000 | ORAL_TABLET | Freq: Four times a day (QID) | ORAL | 0 refills | Status: DC | PRN

## 2023-07-09 MED ORDER — SODIUM CHLORIDE 0.9 % IV BOLUS
1000.0000 mL | Freq: Once | INTRAVENOUS | Status: AC
  Administered 2023-07-09: 1000 mL via INTRAVENOUS

## 2023-07-09 MED ORDER — ONDANSETRON 4 MG PO TBDP: 4.0000 mg | ORAL_TABLET | Freq: Three times a day (TID) | ORAL | 0 refills | Status: DC | PRN

## 2023-07-09 NOTE — ED Triage Notes (Signed)
Pt seen @ [facility] Thursday for chest pain/anxiety where blood in urine found. Discharged then yesterday woke up in pain. States a CT was done and showed a 6mm stone. Pain is now continuous so he came in today.

## 2023-07-09 NOTE — ED Provider Notes (Signed)
[place] EMERGENCY DEPARTMENT AT [facility] Provider Note   CSN: [id number] Arrival date & time: 07/09/23  8295     History  Chief Complaint  Patient presents with   Back Pain   Flank Pain    [name] is a 37 y.o. male.  Patient with history of kidney stones, hypertension, hyperlipidemia, NASH presents today with complaints of right flank pain. He was seen for same at [facility] yesterday and was diagnosed with a 6 mm kidney stone on the right side, discharged with tramadol and flomax. However, given it was after 7 pm when he was discharged, the pharmacy was closed and he was unable to pick up his prescriptions. He states that he woke up this morning and his pain was back, tried to get to the pharmacy to pick up his prescriptions but on the way started vomiting and therefore decided to come here instead. He states that he sees Dr. Ronne Binning for his kidney stones and has had 15 stents placed previously. He denies any fevers, chills, diarrhea, abdominal pain, or dysuria.   The history is provided by the patient. No language interpreter was used.  Back Pain Flank Pain       Home Medications Prior to Admission medications   Medication Sig Start Date End Date Taking? Authorizing Provider  allopurinol (ZYLOPRIM) 300 MG tablet Take 1 tablet (300 mg total) by mouth at bedtime. 03/25/23   McKenzie, Mardene Celeste, MD  amLODipine (NORVASC) 5 MG tablet TAKE 1 TABLET BY MOUTH AT BEDTIME 06/07/23   Everlene Other G, DO  pantoprazole (PROTONIX) 40 MG tablet Take 1 tablet by mouth once daily 06/01/23   Tommie Sams, DO  Potassium Citrate (UROCIT-K 15) 15 MEQ (1620 MG) TBCR Take 1 tablet by mouth in the morning and at bedtime. 03/25/23   McKenzie, Mardene Celeste, MD      Allergies    Phenergan [promethazine hcl]    Review of Systems   Review of Systems  Genitourinary:  Positive for flank pain.  All other systems reviewed and are negative.   Physical Exam Updated Vital Signs BP  (!) 143/98 (BP Location: Left Arm)   Pulse 78   Temp 98.2 F (36.8 C) (Oral)   Resp 20   Ht 5\' 10"  (1.778 m)   Wt 115.7 kg   SpO2 98%   BMI 36.59 kg/m  Physical Exam Vitals and nursing note reviewed.  Constitutional:      General: He is not in acute distress.    Appearance: Normal appearance. He is normal weight. He is not ill-appearing, toxic-appearing or diaphoretic.  HENT:     Head: Normocephalic and atraumatic.  Cardiovascular:     Rate and Rhythm: Normal rate.  Pulmonary:     Effort: Pulmonary effort is normal. No respiratory distress.  Abdominal:     General: Abdomen is flat.     Palpations: Abdomen is soft.     Tenderness: There is right CVA tenderness.  Musculoskeletal:        General: Normal range of motion.     Cervical back: Normal range of motion.  Skin:    General: Skin is warm and dry.  Neurological:     General: No focal deficit present.     Mental Status: He is alert.  Psychiatric:        Mood and Affect: Mood normal.        Behavior: Behavior normal.     ED Results / Procedures / Treatments  Labs (all labs ordered are listed, but only abnormal results are displayed) Labs Reviewed  URINALYSIS, ROUTINE W REFLEX MICROSCOPIC - Abnormal; Notable for the following components:      Result Value   Hgb urine dipstick SMALL (*)    All other components within normal limits  CBC - Abnormal; Notable for the following components:   WBC 12.5 (*)    Hemoglobin 17.1 (*)    All other components within normal limits  BASIC METABOLIC PANEL - Abnormal; Notable for the following components:   Sodium 134 (*)    Glucose, Bld 112 (*)    BUN 23 (*)    Creatinine, Ser 1.52 (*)    Calcium 8.6 (*)    All other components within normal limits    EKG None  Radiology No results found.  Procedures Procedures    Medications Ordered in ED Medications  sodium chloride 0.9 % bolus 1,000 mL (has no administration in time range)  ketorolac (TORADOL) 15 MG/ML  injection 15 mg (has no administration in time range)  ondansetron (ZOFRAN) injection 4 mg (has no administration in time range)    ED Course/ Medical Decision Making/ A&P                             Medical Decision Making Amount and/or Complexity of Data Reviewed Labs: ordered.  Risk Prescription drug management.   This patient is a 37 y.o. male who presents to the ED for concern of right flank pain, this involves an extensive number of treatment options, and is a complaint that carries with it a high risk of complications and morbidity. The emergent differential diagnosis prior to evaluation includes, but is not limited to,  AAA, renal artery/vein embolism/thrombosis, mesenteric ischemia, pyelonephritis, nephrolithiasis, cystitis, biliary colic, pancreatitis, perforated peptic ulcer, appendicitis, diverticulitis, bowel obstruction   This is not an exhaustive differential.   Past Medical History / Co-morbidities / Social History: history of kidney stones, hypertension, hyperlipidemia, NASH   Additional history: Chart reviewed. Pertinent results include: Seen at [facility] yesterday, had CT renal that showed Right hydronephrosis and ureterectasis down to a 6 mm mid ureteral calculus. Given tramadol and flomax and d/ced with recs for close outpatient follow-up. Follows with urology Dr. Ronne Binning  Physical Exam: Physical exam performed. The pertinent findings include: right CVA tenderness  Lab Tests: I ordered, and personally interpreted labs.  The pertinent results include:  UA noninfectious, WBC 12.5, hgb 17.1. Creatinine 1.52 up from 1.31 yesterday.    Medications: I ordered medication including toradol, zofran, fluids for pain, nausea/vomiting, dehydration. Reevaluation of the patient after these medicines showed that the patient resolved. I have reviewed the patients home medicines and have made adjustments as needed.   Disposition: After consideration of the diagnostic  results and the patients response to treatment, I feel that emergency department workup does not suggest an emergent condition requiring admission or immediate intervention beyond what has been performed at this time. The plan is: discharge with close outpatient follow-up.  After medications, patient's pain is improved.  He is now able to eat and drink without any residual episodes of nausea or vomiting.  His kidney function is somewhat elevated, however it is likely exacerbated by dehydration from nausea and vomiting.  Patient should be able to rehydrate himself orally.  Emphasized importance of filling the medications prescribed to him by providers at [facility].  He is concerned because in the past tramadol has not  been sufficient to manage his pain and is requesting something stronger.  Will send for a few doses of Percocet as well.  PDMP reviewed.  Patient advised not to drive or operate heavy machinery while taking this medication.  Will also send for Zofran for any residual nausea/vomiting.  The patient is safe for discharge and has been instructed to return immediately for worsening symptoms, change in symptoms or any other concerns.   Final Clinical Impression(s) / ED Diagnoses Final diagnoses:  Ureterolithiasis  Nausea and vomiting, unspecified vomiting type  Right flank pain    Rx / DC Orders ED Discharge Orders          Ordered    oxyCODONE-acetaminophen (PERCOCET/ROXICET) 5-325 MG tablet  Every 6 hours PRN        07/09/23 1039    ondansetron (ZOFRAN-ODT) 4 MG disintegrating tablet  Every 8 hours PRN        07/09/23 1039          An After Visit Summary was printed and given to the patient.     Vear Clock 07/09/23 1041    Terrilee Files, MD 07/09/23 [phone]

## 2023-07-09 NOTE — Discharge Instructions (Addendum)
As we discussed, your workup in the ER today was reassuring for acute findings.  Laboratory evaluation did not reveal any emergent concerns.  Please fill the medication that the previous provider gave you and take it as prescribed.  I have also given you a prescription for some Zofran for you to take for any residual nausea/vomiting.  I have also given you Percocet which is a narcotic pain medication for you to take as prescribed as needed for severe pain only.  Do not drive or operate heavy machinery while taking this medication as it can be sedating.  Please call your urologist at your earliest convenience to schedule an appointment for follow-up.  Return if development of any new or worsening symptoms.

## 2023-07-09 NOTE — ED Triage Notes (Signed)
Pt was given toradol at hospital, tried TENS unit at home and Tylenol with no relief.

## 2023-07-11 ENCOUNTER — Other Ambulatory Visit: Payer: Self-pay | Admitting: Urology

## 2023-07-11 ENCOUNTER — Other Ambulatory Visit: Payer: Self-pay

## 2023-07-11 ENCOUNTER — Encounter: Payer: Self-pay | Admitting: Urology

## 2023-07-11 DIAGNOSIS — N200 Calculus of kidney: Secondary | ICD-10-CM

## 2023-07-11 NOTE — Telephone Encounter (Signed)
Per Dr. Ronne Binning patient can have lithotripsy on 07/12/23. Patient called and made aware. Lithotripsy packet went over with patient and he voiced understanding that he will need to be NPO after midnight and be at [facility] short stay @ 10:30am

## 2023-07-11 NOTE — Telephone Encounter (Signed)
Hi Mr. Linthicum. Sorry for your wait. Per our phone conversation a message has been sent to Dr. Ronne Binning to see if you can be added for litho tomorrow. If he is unable to treat your stone in a timely manner I will follow back up with you later today on his recommendations.

## 2023-07-12 ENCOUNTER — Other Ambulatory Visit: Payer: Self-pay | Admitting: Family Medicine

## 2023-07-12 ENCOUNTER — Ambulatory Visit (HOSPITAL_COMMUNITY): Payer: BC Managed Care – PPO

## 2023-07-12 ENCOUNTER — Encounter (HOSPITAL_COMMUNITY)
Admission: RE | Disposition: A | Discharge status: Home / Self Care | Payer: Self-pay | Source: Home / Self Care | Attending: Urology

## 2023-07-12 ENCOUNTER — Ambulatory Visit (HOSPITAL_COMMUNITY)
Admission: RE | Admit: 2023-07-12 | Discharge: 2023-07-12 | Disposition: A | Discharge status: Home / Self Care | Payer: BC Managed Care – PPO | Attending: Urology | Admitting: Urology

## 2023-07-12 DIAGNOSIS — E669 Obesity, unspecified: Secondary | ICD-10-CM | POA: Diagnosis not present

## 2023-07-12 DIAGNOSIS — I878 Other specified disorders of veins: Secondary | ICD-10-CM | POA: Diagnosis not present

## 2023-07-12 DIAGNOSIS — N200 Calculus of kidney: Secondary | ICD-10-CM

## 2023-07-12 DIAGNOSIS — N201 Calculus of ureter: Secondary | ICD-10-CM | POA: Diagnosis not present

## 2023-07-12 DIAGNOSIS — I10 Essential (primary) hypertension: Secondary | ICD-10-CM | POA: Insufficient documentation

## 2023-07-12 DIAGNOSIS — K219 Gastro-esophageal reflux disease without esophagitis: Secondary | ICD-10-CM

## 2023-07-12 HISTORY — PX: EXTRACORPOREAL SHOCK WAVE LITHOTRIPSY: SHX1557

## 2023-07-12 SURGERY — LITHOTRIPSY, ESWL
Anesthesia: LOCAL | Laterality: Right

## 2023-07-12 MED ORDER — DIPHENHYDRAMINE HCL 25 MG PO CAPS
25.0000 mg | ORAL_CAPSULE | ORAL | Status: AC
  Administered 2023-07-12: 25 mg via ORAL
  Filled 2023-07-12: qty 1

## 2023-07-12 MED ORDER — ONDANSETRON HCL 4 MG PO TABS: 4.0000 mg | ORAL_TABLET | Freq: Every day | ORAL | 1 refills | Status: DC | PRN

## 2023-07-12 MED ORDER — OXYCODONE-ACETAMINOPHEN 5-325 MG PO TABS: 1.0000 | ORAL_TABLET | Freq: Four times a day (QID) | ORAL | 0 refills | Status: DC | PRN

## 2023-07-12 MED ORDER — TAMSULOSIN HCL 0.4 MG PO CAPS: 0.4000 mg | ORAL_CAPSULE | Freq: Every day | ORAL | 0 refills | Status: DC

## 2023-07-12 MED ORDER — SODIUM CHLORIDE 0.9 % IV SOLN: Freq: Once | INTRAVENOUS | Status: AC

## 2023-07-12 MED ORDER — DIAZEPAM 5 MG PO TABS
10.0000 mg | ORAL_TABLET | Freq: Once | ORAL | Status: AC
  Administered 2023-07-12: 10 mg via ORAL
  Filled 2023-07-12: qty 2

## 2023-07-12 NOTE — H&P (Signed)
HPI: [name] is a 36yo here for rigth ESWL. He was diagnosed with a right ureteral calculus 4 days ago. He drinks 4-5 mountain dews daily. He passed 3 small calculi since last visit. No flank pain currently. No denies any significant LUTS. Renal US shows no calculi. He is on allopurinol and urocit K.     PMH:     Past Medical History:  Diagnosis Date   Acid reflux     Arthritis     Complication of anesthesia      hard to wake up after anesthesia   Elevated LFTs      mildly elevated transaminases in past, now normalized    GERD (gastroesophageal reflux disease)     Gout     History of kidney stones     Hypertension            Surgical History:      Past Surgical History:  Procedure Laterality Date   APPENDECTOMY   01/27/2015   BIOPSY   12/10/2019    Procedure: BIOPSY;  Surgeon: Malissa Hippo, MD;  Location: [facility];  Service: Endoscopy;;  esophagus gastric   CHOLECYSTECTOMY       COLONOSCOPY WITH ESOPHAGOGASTRODUODENOSCOPY (EGD) N/A 02/25/2014    NL TI, 2 SIMPLE ADENOMAS(1:>1 CM), LGE IH-FG POLYPS, PROMINENT AMPULLA. Needs Colonoscopy surveillance in 2018   COLONOSCOPY WITH PROPOFOL N/A 09/11/2021    Procedure: COLONOSCOPY WITH PROPOFOL;  Surgeon: Dolores Frame, MD;  Location: [facility];  Service: Gastroenterology;  Laterality: N/A;  10:35   ESOPHAGEAL DILATION N/A 08/30/2018    Procedure: ESOPHAGEAL DILATION;  Surgeon: Malissa Hippo, MD;  [facility]: [facility];  Service: Endoscopy;  Laterality: N/A;   ESOPHAGEAL DILATION N/A 12/10/2019    Procedure: ESOPHAGEAL DILATION;  Surgeon: Malissa Hippo, MD;  [facility]: [facility];  Service: Endoscopy;  Laterality: N/A;   ESOPHAGOGASTRODUODENOSCOPY N/A 04/29/2015    Dr. Darrick Penna: 1. mild non-erosive gastritis (inflammation) was found in the gastric antrum. 2. Prominent Ampullla 6mm. benign path with pyloric metaplasia   ESOPHAGOGASTRODUODENOSCOPY N/A 07/12/2016    Procedure:  ESOPHAGOGASTRODUODENOSCOPY (EGD);  Surgeon: West Bali, MD;  [facility]: [facility];  Service: Endoscopy;  Laterality: N/A;  830   ESOPHAGOGASTRODUODENOSCOPY N/A 08/30/2018    Procedure: ESOPHAGOGASTRODUODENOSCOPY (EGD);  Surgeon: Malissa Hippo, MD;  [facility]: [facility];  Service: Endoscopy;  Laterality: N/A;  2:00   ESOPHAGOGASTRODUODENOSCOPY (EGD) WITH PROPOFOL N/A 12/10/2019    Procedure: ESOPHAGOGASTRODUODENOSCOPY (EGD) WITH PROPOFOL;  Surgeon: Malissa Hippo, MD;  [facility]: [facility];  Service: Endoscopy;  Laterality: N/A;  12:55   EUS N/A 03/20/2014    Dr. Dulce Sellar: prominent major papilla s/p biopsy, minor papilla without clear adenomatous or mass-like appearance, chronic duodenitis on path.    INCISIONAL HERNIA REPAIR N/A 05/02/2019    Procedure: HERNIA REPAIR OPEN INCISIONAL WITH MESH;  Surgeon: Lucretia Roers, MD;  Location: [facility];  Service: General;  Laterality: N/A;   KIDNEY STONE SURGERY   age 46 and age 5    lithotripsy, stent   POLYPECTOMY   12/10/2019    Procedure: POLYPECTOMY;  Surgeon: Malissa Hippo, MD;  [facility]: [facility];  Service: Endoscopy;;  gastric   POLYPECTOMY   09/11/2021    Procedure: POLYPECTOMY INTESTINAL;  Surgeon: Dolores Frame, MD;  [facility]: [facility];  Service: Gastroenterology;;   Gaspar Bidding DILATION N/A 07/12/2016    Procedure: Gaspar Bidding DILATION;  Surgeon: West Bali, MD;  Location: [facility];  Service: Endoscopy;  Laterality: N/A;   VENTRAL HERNIA REPAIR N/A 11/28/2019    Procedure: LAPAROSCOPIC VENTRAL HERNIA WITH MESH;  Surgeon: Lucretia Roers, MD;  Location: [facility];  Service: General;  Laterality: N/A;   WISDOM TOOTH EXTRACTION              Home Medications:  Allergies as of 03/25/2023         Reactions    Phenergan [promethazine Hcl] Swelling    arn swelled and hurt for 3 days, pt would rather not have            Medication List           Accurate as of March 25, 2023 12:33 PM. If you have  any questions, ask your nurse or doctor.              allopurinol 300 MG tablet Commonly known as: ZYLOPRIM Take 1 tablet (300 mg total) by mouth at bedtime.    amLODipine 5 MG tablet Commonly known as: NORVASC TAKE ONE TABLET BY MOUTH AT BEDTIME    pantoprazole 40 MG tablet Commonly known as: PROTONIX TAKE ONE TABLET BY MOUTH ONCE DAILY    Potassium Citrate 15 MEQ (1620 MG) Tbcr Commonly known as: Urocit-K 15 Take 1 tablet by mouth in the morning and at bedtime.             Allergies:  Allergies       Allergies  Allergen Reactions   Phenergan [Promethazine Hcl] Swelling      arn swelled and hurt for 3 days, pt would rather not have        Family History:      Family History  Problem Relation Age of Onset   Diabetes Father     COPD Father     Colon cancer Neg Hx          does not know paternal side          Social History:  reports that he has never smoked. He has never used smokeless tobacco. He reports that he does not drink alcohol and does not use drugs.   ROS: All other review of systems were reviewed and are negative except what is noted above in HPI   Physical Exam: BP 120/76   Pulse 64   Constitutional:  Alert and oriented, No acute distress. HEENT: [place] AT, moist mucus membranes.  Trachea midline, no masses. Cardiovascular: No clubbing, cyanosis, or edema. Respiratory: Normal respiratory effort, no increased work of breathing. GI: Abdomen is soft, nontender, nondistended, no abdominal masses GU: No CVA tenderness.  Lymph: No cervical or inguinal lymphadenopathy. Skin: No rashes, bruises or suspicious lesions. Neurologic: Grossly intact, no focal deficits, moving all 4 extremities. Psychiatric: Normal mood and affect.   Laboratory Data: Recent Labs       Lab Results  Component Value Date    WBC 9.9 07/14/2021    HGB 17.5 (H) 07/14/2021    HCT 50.3 (H) 07/14/2021    MCV 88.4 07/14/2021    PLT 308 07/14/2021        Recent Labs        Lab Results  Component Value Date    CREATININE 1.10 09/20/2022        Recent Labs  No results found for: "PSA"     Recent Labs  No results found for: "TESTOSTERONE"     Recent Labs  No results found for: "HGBA1C"  Urinalysis Labs (Brief)          Component Value Date/Time    COLORURINE YELLOW 02/24/2009 0912    APPEARANCEUR Hazy (A) 09/06/2022 1521    LABSPEC >1.030 (H) 02/24/2009 0912    PHURINE 6.0 02/24/2009 0912    GLUCOSEU Negative 09/06/2022 1521    HGBUR LARGE (A) 02/24/2009 0912    BILIRUBINUR Negative 09/06/2022 1521    KETONESUR NEGATIVE 02/24/2009 0912    PROTEINUR Negative 09/06/2022 1521    PROTEINUR 30 (A) 02/24/2009 0912    UROBILINOGEN negative (A) 01/30/2020 1421    UROBILINOGEN 0.2 02/24/2009 0912    NITRITE Negative 09/06/2022 1521    NITRITE NEGATIVE 02/24/2009 0912    LEUKOCYTESUR Negative 09/06/2022 1521        Recent Labs       Lab Results  Component Value Date    LABMICR Comment 09/06/2022        Pertinent Imaging: Renal US 03/17/2023: Images reviewed and discussed with the patient  Results for orders placed during the hospital encounter of 06/25/05   DG Abd 1 View   Narrative Clinical Data: Kidney stone. ABDOMEN - ONE VIEW: Findings: Approximate 2 x 3 mm calcific density projects over the region of the upper pole of the right kidney. This is also noted on 04/16/05 plain film and is compatible with a small calculus. No definite calcifications along the distribution of the ureters.   Impression Small right renal calculus.   Provider: Kyra Searles   No results found for this or any previous visit.   No results found for this or any previous visit.   No results found for this or any previous visit.   Results for orders placed during the hospital encounter of 03/17/23   Ultrasound renal complete   Narrative CLINICAL DATA:  nephrolithiasis   EXAM: RENAL / URINARY TRACT ULTRASOUND COMPLETE   COMPARISON:  August 30, 2022, September 29, 2020   FINDINGS: Right Kidney:   Renal measurements: 12.0 x 6.2 x 6.1 cm = volume: 234 mL. Echogenicity within normal limits. No mass or hydronephrosis visualized.   Left Kidney:   Renal measurements: 12.8 x 5.2 x 4.7 cm = volume: 164 mL. Echogenicity within normal limits. No suspicious mass or hydronephrosis visualized. Revisualization of a previously characterized cyst of the LEFT kidney measuring up to 15 mm; on today's exam, it appears more hypoechoic and may contain hemorrhagic or proteinaceous component.   Bladder:   Appears normal for degree of bladder distention.   Other:   Increased hepatic echogenicity.   IMPRESSION: 1. No hydronephrosis. 2. Revisualization of a previously characterized cyst of the LEFT kidney measuring up to 15 mm; on today's exam, it appears more hypoechoic and may contain hemorrhagic or proteinaceous component. Recommend attention on future studies. 3. Hepatic steatosis.     Electronically Signed By: Meda Klinefelter M.D. On: 03/18/2023 17:17   No valid procedures specified. No results found for this or any previous visit.   No results found for this or any previous visit.     Assessment & Plan:     1. Kidney stones -We discussed the management of kidney stones. These options include observation, ureteroscopy, shockwave lithotripsy (ESWL) and percutaneous nephrolithotomy (PCNL). We discussed which options are relevant to the patient's stone(s). We discussed the natural history of kidney stones as well as the complications of untreated stones and the impact on quality of life without treatment as well as with each of the above listed treatments. We  also discussed the efficacy of each treatment in its ability to clear the stone burden. With any of these management options I discussed the signs and symptoms of infection and the need for emergent treatment should these be experienced. For each option we discussed the  ability of each procedure to clear the patient of their stone burden.   For observation I described the risks which include but are not limited to silent renal damage, life-threatening infection, need for emergent surgery, failure to pass stone and pain.   For ureteroscopy I described the risks which include bleeding, infection, damage to contiguous structures, positioning injury, ureteral stricture, ureteral avulsion, ureteral injury, need for prolonged ureteral stent, inability to perform ureteroscopy, need for an interval procedure, inability to clear stone burden, stent discomfort/pain, heart attack, stroke, pulmonary embolus and the inherent risks with general anesthesia.   For shockwave lithotripsy I described the risks which include arrhythmia, kidney contusion, kidney hemorrhage, need for transfusion, pain, inability to adequately break up stone, inability to pass stone fragments, Steinstrasse, infection associated with obstructing stones, need for alternate surgical procedure, need for repeat shockwave lithotripsy, MI, CVA, PE and the inherent risks with anesthesia/conscious sedation.   For PCNL I described the risks including positioning injury, pneumothorax, hydrothorax, need for chest tube, inability to clear stone burden, renal laceration, arterial venous fistula or malformation, need for embolization of kidney, loss of kidney or renal function, need for repeat procedure, need for prolonged nephrostomy tube, ureteral avulsion, MI, CVA, PE and the inherent risks of general anesthesia.   - The patient would like to proceed with right ESWL

## 2023-07-13 ENCOUNTER — Encounter (HOSPITAL_COMMUNITY): Payer: Self-pay | Admitting: Urology

## 2023-07-18 ENCOUNTER — Telehealth: Payer: Self-pay

## 2023-07-18 NOTE — Telephone Encounter (Signed)
Prescription Request  07/18/2023  LOV: Visit date not found  What is the name of the medication or equipment? amLODipine (NORVASC) 5 MG tablet   Have you contacted your pharmacy to request a refill? Yes   Which pharmacy would you like this sent to? [facility]   Patient notified that their request is being sent to the clinical staff for review and that they should receive a response within 2 business days.   Please advise at Mobile [phone] (mobile)

## 2023-07-18 NOTE — Telephone Encounter (Signed)
Received via fax Rx request: Prescription sent electronically to pharmacy  

## 2023-07-18 NOTE — Telephone Encounter (Signed)
Prescription Request  07/18/2023  LOV: Visit date not found  What is the name of the medication or equipment? amLODipine (NORVASC) 5 MG tablet   Have you contacted your pharmacy to request a refill? Yes   Which pharmacy would you like this sent to?  [facility]   Patient notified that their request is being sent to the clinical staff for review and that they should receive a response within 2 business days.   Please advise at Mobile [phone] (mobile)

## 2023-07-18 NOTE — Telephone Encounter (Signed)
Duplicate- Received via fax Rx request: Prescription sent electronically to pharmacy

## 2023-08-01 NOTE — Progress Notes (Unsigned)
Name: [name] DOB: [birth year] MRN: [id number]  Diagnoses: 1) Post-operative state  HPI: [name] presents post-operatively s/p right ESWL procedure on 07/12/2023 by Dr. Ronne Binning for management of a right distal ureteral stone.  Postop course: [name] today: Awaiting radiology read; no stones appreciated.  Today He reports that he passed some stones fragments last Friday - possibly a different stone that prior. Pt denies  flank or  abdominal pain. He denies increased urinary urgency, frequency, nocturia, dysuria, gross hematuria, hesitancy, straining to void, or sensations of incomplete emptying.   Fall Screening: Do you usually have a device to assist in your mobility? No   Medications: Current Outpatient Medications  Medication Sig Dispense Refill   allopurinol (ZYLOPRIM) 300 MG tablet Take 1 tablet (300 mg total) by mouth at bedtime. 90 tablet 3   amLODipine (NORVASC) 5 MG tablet TAKE 1 TABLET BY MOUTH AT BEDTIME 90 tablet 1   pantoprazole (PROTONIX) 40 MG tablet Take 1 tablet by mouth once daily 90 tablet 1   ondansetron (ZOFRAN) 4 MG tablet Take 1 tablet (4 mg total) by mouth daily as needed for nausea or vomiting. 30 tablet 1   ondansetron (ZOFRAN-ODT) 4 MG disintegrating tablet Take 1 tablet (4 mg total) by mouth every 8 (eight) hours as needed for nausea or vomiting. 20 tablet 0   oxyCODONE-acetaminophen (PERCOCET/ROXICET) 5-325 MG tablet Take 1 tablet by mouth every 6 (six) hours as needed for severe pain. 30 tablet 0   Potassium Citrate (UROCIT-K 15) 15 MEQ (1620 MG) TBCR Take 1 tablet by mouth in the morning and at bedtime. 60 tablet 11   tamsulosin (FLOMAX) 0.4 MG CAPS capsule Take 1 capsule by mouth daily as needed when stone symptoms occur. 30 capsule 2   No current facility-administered medications for this visit.    Allergies: Allergies  Allergen Reactions   Phenergan [Promethazine Hcl] Swelling    arn swelled and hurt for 3 days, pt would rather not  have    Past Medical History:  Diagnosis Date   Acid reflux    Arthritis    Complication of anesthesia    hard to wake up after anesthesia   Elevated LFTs    mildly elevated transaminases in past, now normalized    GERD (gastroesophageal reflux disease)    Gout    History of kidney stones    Hypertension    Past Surgical History:  Procedure Laterality Date   APPENDECTOMY  01/27/2015   BIOPSY  12/10/2019   Procedure: BIOPSY;  Surgeon: Malissa Hippo, MD;  [facility]: [facility];  Service: Endoscopy;;  esophagus gastric   CHOLECYSTECTOMY     COLONOSCOPY WITH ESOPHAGOGASTRODUODENOSCOPY (EGD) N/A 02/25/2014   NL TI, 2 SIMPLE ADENOMAS(1:>1 CM), LGE IH-FG POLYPS, PROMINENT AMPULLA. Needs Colonoscopy surveillance in 2018   COLONOSCOPY WITH PROPOFOL N/A 09/11/2021   Procedure: COLONOSCOPY WITH PROPOFOL;  Surgeon: Dolores Frame, MD;  [facility]: [facility];  Service: Gastroenterology;  Laterality: N/A;  10:35   ESOPHAGEAL DILATION N/A 08/30/2018   Procedure: ESOPHAGEAL DILATION;  Surgeon: Malissa Hippo, MD;  [facility]: [facility];  Service: Endoscopy;  Laterality: N/A;   ESOPHAGEAL DILATION N/A 12/10/2019   Procedure: ESOPHAGEAL DILATION;  Surgeon: Malissa Hippo, MD;  [facility]: [facility];  Service: Endoscopy;  Laterality: N/A;   ESOPHAGOGASTRODUODENOSCOPY N/A 04/29/2015   Dr. Darrick Penna: 1. mild non-erosive gastritis (inflammation) was found in the gastric antrum. 2. Prominent Ampullla 6mm. benign path with pyloric metaplasia  ESOPHAGOGASTRODUODENOSCOPY N/A 07/12/2016   Procedure: ESOPHAGOGASTRODUODENOSCOPY (EGD);  Surgeon: West Bali, MD;  [facility]: [facility];  Service: Endoscopy;  Laterality: N/A;  830   ESOPHAGOGASTRODUODENOSCOPY N/A 08/30/2018   Procedure: ESOPHAGOGASTRODUODENOSCOPY (EGD);  Surgeon: Malissa Hippo, MD;  [facility];  Service: Endoscopy;  Laterality: N/A;  2:00   ESOPHAGOGASTRODUODENOSCOPY (EGD) WITH PROPOFOL N/A 12/10/2019    Procedure: ESOPHAGOGASTRODUODENOSCOPY (EGD) WITH PROPOFOL;  Surgeon: Malissa Hippo, MD;  [facility];  Service: Endoscopy;  Laterality: N/A;  12:55   EUS N/A 03/20/2014   Dr. Dulce Sellar: prominent major papilla s/p biopsy, minor papilla without clear adenomatous or mass-like appearance, chronic duodenitis on path.    EXTRACORPOREAL SHOCK WAVE LITHOTRIPSY Right 07/12/2023   Procedure: EXTRACORPOREAL SHOCK WAVE LITHOTRIPSY (ESWL);  Surgeon: Malen Gauze, MD;  Location: [facility];  Service: Urology;  Laterality: Right;  McKenzie has a surgery before this case.  Office to tell pt to arrive at 10:30 and NPO after midnight.   INCISIONAL HERNIA REPAIR N/A 05/02/2019   Procedure: HERNIA REPAIR OPEN INCISIONAL WITH MESH;  Surgeon: Lucretia Roers, MD;  Location: [facility];  Service: General;  Laterality: N/A;   KIDNEY STONE SURGERY  age 37 and age 61   lithotripsy, stent   POLYPECTOMY  12/10/2019   Procedure: POLYPECTOMY;  Surgeon: Malissa Hippo, MD;  [facility];  Service: Endoscopy;;  gastric   POLYPECTOMY  09/11/2021   Procedure: POLYPECTOMY INTESTINAL;  Surgeon: Dolores Frame, MD;  [facility];  Service: Gastroenterology;;   Gaspar Bidding DILATION N/A 07/12/2016   Procedure: Gaspar Bidding DILATION;  Surgeon: West Bali, MD;  [facility];  Service: Endoscopy;  Laterality: N/A;   VENTRAL HERNIA REPAIR N/A 11/28/2019   Procedure: LAPAROSCOPIC VENTRAL HERNIA WITH MESH;  Surgeon: Lucretia Roers, MD;  Location: [facility];  Service: General;  Laterality: N/A;   WISDOM TOOTH EXTRACTION     Family History  Problem Relation Age of Onset   Diabetes Father    COPD Father    Colon cancer Neg Hx        does not know paternal side   Social History   Socioeconomic History   Marital status: Married    Spouse name: Not on file   Number of children: 3   Years of education: Not on file   Highest education level: Not on file  Occupational History    Occupation: Therapist, sports: [employer]  Tobacco Use   Smoking status: Never   Smokeless tobacco: Never  Vaping Use   Vaping status: Never Used  Substance and Sexual Activity   Alcohol use: No   Drug use: No   Sexual activity: Not on file  Other Topics Concern   Not on file  Social History Narrative   Not on file   Social Determinants of Health   Financial Resource Strain: Not on file  Food Insecurity: Not on file  Transportation Needs: Not on file  Physical Activity: Not on file  Stress: Not on file  Social Connections: Not on file  Intimate Partner Violence: Not on file    SUBJECTIVE  Review of Systems Constitutional: Patient denies any unintentional weight loss or change in strength lntegumentary: Patient denies any rashes or pruritus Cardiovascular: Patient denies chest pain or syncope Respiratory: Patient denies shortness of breath Gastrointestinal: Patient denies nausea, vomiting, constipation, or diarrhea Musculoskeletal: Patient denies muscle cramps or weakness Neurologic: Patient denies convulsions or seizures  Psychiatric: Patient denies memory problems Allergic/Immunologic: Patient denies recent allergic reaction(s) Hematologic/Lymphatic: Patient denies bleeding tendencies Endocrine: Patient denies heat/cold intolerance  GU: As per HPI.  OBJECTIVE Vitals:   08/02/23 1315  BP: (!) 145/84  Pulse: 90  Temp: 99.9 F (37.7 C)   There is no height or weight on file to calculate BMI.  Physical Examination  Constitutional: No obvious distress; patient is non-toxic appearing  Cardiovascular: No visible lower extremity edema.  Respiratory: The patient does not have audible wheezing/stridor; respirations do not appear labored  Gastrointestinal: Abdomen non-distended Musculoskeletal: Normal ROM of UEs  Skin: No obvious rashes/open sores  Neurologic: CN 2-12 grossly intact Psychiatric: Answered questions appropriately with normal affect   Hematologic/Lymphatic/Immunologic: No obvious bruises or sites of spontaneous bleeding  UA: negative   ASSESSMENT Kidney stones - Plan: Urinalysis, Routine w reflex microscopic, US RENAL, US RENAL, tamsulosin (FLOMAX) 0.4 MG CAPS capsule  Postop check  We reviewed the operative procedures and findings.  He is doing well. Pre-operative symptoms are improved since the procedure. We reviewed recent imaging results; awaiting radiology results, appears to have no acute findings.  For stone prevention: Advised adequate hydration and we discussed option to consider low oxalate diet given that calcium oxalate is the most common type of stone. Handout provided about stone prevention diet.  Advised to continue Allopurinol and potassium citrate as per Dr. Ronne Binning for stone prevention. Refill sent for Flomax PRN for medical expulsive therapy.   Will plan to follow up in 6 months with [facility] for stone surveillance or sooner if needed. Pt verbalized understanding and agreement. All questions were answered.  PLAN Advised the following: Continue Allopurinol. Continue potassium citrate. Flomax PRN.  Maintain adequate fluid intake. Return in about 6 months (around 02/02/2024) for [facility], UA, & f/u with Dr. Ronne Binning.  Orders Placed This Encounter  Procedures   US RENAL    Standing Status:   Future    Standing Expiration Date:   08/01/2024    Order Specific Question:   Reason for Exam (SYMPTOM  OR DIAGNOSIS REQUIRED)    Answer:   kidney stone known or suspected    Order Specific Question:   Preferred imaging location?    Answer:   [facility]   US RENAL    Standing Status:   Future    Standing Expiration Date:   08/01/2024    Order Specific Question:   Reason for Exam (SYMPTOM  OR DIAGNOSIS REQUIRED)    Answer:   kidney stone known or suspected    Order Specific Question:   Preferred imaging location?    Answer:   [facility]   Urinalysis, Routine w reflex microscopic    It has  been explained that the patient is to follow regularly with their PCP in addition to all other providers involved in their care and to follow instructions provided by these respective offices. Patient advised to contact urology clinic if any urologic-pertaining questions, concerns, new symptoms or problems arise in the interim period.  Patient Instructions  >80% of stones are calcium oxalate. This type of stones forms when body either isn't clearing oxalate well enough, is making too much oxalate, or too little citrate. This results in oxalate binding to form crystals, which continue to aggregate and form stones.  Limiting calcium does not help, but limiting oxalate in the diet can help. Increasing citric acid intake may also help.  The following measures may help to prevent the recurrence of stones: Increase water intake  to 2-2.5 liters per day May add citrus juice (lemon, lime or orange juice) to water Moderation in dairy foods Decrease in salt content Low Oxalate diet: Oxylates are found in foods like Tomato, Spinach, red wine and chocolate (see additional resources below).  Internet resources for information regarding low oxalate diet:  https://kidneystones.yangchunwu.com https://my.[employer]Stretch.be  Foods Low in Sodium or Oxalate Foods You Can Eat  Drinks Coffee, fruit and veggie juice (using the recommended veggies), fruit punch  Fruits Apples, apricots (fresh or canned), avocado, bananas, cherries (sweet), cranberries, grapefruit, red or green grapes, lemon and lime juice, melons, nectarines, papayas, peaches, pears, pineapples, oranges, strawberries (fresh), tangerines  Veggies Artichokes, asparagus, bamboo shoots, broccoli, brussels sprouts, cabbage, cauliflower, chayote squash, chicory, corn, cucumbers, endive, lettuce, lima beans, mushrooms, onions, peas, peppers, potatoes, radishes,  rutabagas, zucchini  Breads, Cereals, Grains Egg noodles, rye bread, cooked and dry cereals without nuts or bran, crackers with unsalted tops, white or wild rice  Meat, Meat Replacements, Fish, Recruitment consultant, fish, poultry, eggs, egg whites, egg replacements  Soup Homemade soup (using the recommended veggies and meat), low-sodium bouillon, low-sodium canned  Desserts Cookies, cakes, ice cream, pudding without chocolate or nuts, candy without chocolate or nuts  Fats and Oils Butter, margarine, cream, oil, salad dressing, mayo  Other Foods Unsalted potato chips or pretzels, herbs (like garlic, garlic powder, onion powder), lemon juice, salt-free seasoning blends, vinegar  Other Foods Low in Oxalate Foods You Can Eat  Drinks Beer, cola, wine, buttermilk, lemonade or limeade (without added vitamin C), milk  Meat, Meat Replacements, Fish, [employer] meat, ham, bacon, hot dogs, bratwurst, sausage, chicken nuggets, cheddar cheese, canned fish and shellfish  Soup Tomato soup, cheese soup  Other Foods Coconuts, lemon or lime juices, sugar or sweeteners, jellies or jams (from the recommended list)   Moderate-Oxalate Foods Foods to Limit   Drinks Fruit and veggie juices (from the list below), chocolate milk, rice milk, hot cocoa, tea   Fruits Blackberries, blueberries, black currants, cherries (sour), fruit cocktail, mangoes, orange peel, prunes, purple plums   Veggies Baked beans, carrots, celery, green beans, parsnips, summer squash, tomatoes, turnips   Breads, Cereals, Grains White bread, cornbread or cornmeal, white English muffins, saltine or soda crackers, brown rice, vanilla wafers, spaghetti and other noodles, firm tofu, bagels, oatmeal   Meat/meat replacements, fish, poultry Sardines   Desserts Chocolate cake   Fats and Oils Macadamia nuts, pistachio nuts, English walnuts   Other Foods Jams or jellies (made with the fruits above), pepper   High-Oxalate Foods Foods to Avoid Drinks Chocolate  drink mixes, soy milk, Ovaltine, instant iced tea, fruit juices of fruits listed below Fruits Apricots (dried), red currants, figs, kiwi, plums, rhubarb Veggies Beans (wax, dried), beets and beet greens, chives, collard greens, eggplant, escarole, dark greens of all kinds, leeks, okra, parsley, rutabagas, spinach, Swiss chard, tomato paste, watercress Breads, Cereals, Grains Amaranth, barley, white corn flour, fried potatoes, fruitcake, grits, soybean products, sweet potatoes, wheat germ and bran, buckwheat flour, All Bran cereal, graham crackers, pretzels, whole wheat bread Meat/meat replacements, fish, poultry Dried beans, peanut butter, soy burgers, miso Desserts Carob, chocolate, marmalades Fats and Oils Nuts (peanuts, almonds, pecans, cashews, hazelnuts), nut butters, sesame seeds, tahini paste Other Foods Poppy seeds   Electronically signed by:  Donnita Falls, MSN, FNP-C, CUNP 08/02/2023 1:45 PM

## 2023-08-02 ENCOUNTER — Ambulatory Visit (INDEPENDENT_AMBULATORY_CARE_PROVIDER_SITE_OTHER): Payer: BC Managed Care – PPO | Admitting: Urology

## 2023-08-02 ENCOUNTER — Ambulatory Visit (HOSPITAL_COMMUNITY)
Admission: RE | Admit: 2023-08-02 | Discharge: 2023-08-02 | Disposition: A | Discharge status: Home / Self Care | Payer: BC Managed Care – PPO | Source: Ambulatory Visit | Attending: Urology | Admitting: Urology

## 2023-08-02 ENCOUNTER — Encounter: Payer: Self-pay | Admitting: Urology

## 2023-08-02 VITALS — BP 145/84 | HR 90 | Temp 99.9°F

## 2023-08-02 DIAGNOSIS — Z09 Encounter for follow-up examination after completed treatment for conditions other than malignant neoplasm: Secondary | ICD-10-CM

## 2023-08-02 DIAGNOSIS — N200 Calculus of kidney: Secondary | ICD-10-CM

## 2023-08-02 DIAGNOSIS — Z87442 Personal history of urinary calculi: Secondary | ICD-10-CM

## 2023-08-02 LAB — MICROSCOPIC EXAMINATION: Bacteria, UA: NONE SEEN

## 2023-08-02 LAB — URINALYSIS, ROUTINE W REFLEX MICROSCOPIC
Bilirubin, UA: NEGATIVE
Glucose, UA: NEGATIVE
Ketones, UA: NEGATIVE
Nitrite, UA: NEGATIVE
Protein,UA: NEGATIVE
Specific Gravity, UA: 1.03 (ref 1.005–1.030)
Urobilinogen, Ur: 0.2 mg/dL (ref 0.2–1.0)
pH, UA: 6 (ref 5.0–7.5)

## 2023-08-02 MED ORDER — TAMSULOSIN HCL 0.4 MG PO CAPS
ORAL_CAPSULE | ORAL | 2 refills | Status: DC
Start: 2023-08-02 — End: 2023-11-24

## 2023-08-02 NOTE — Patient Instructions (Signed)
>80% of stones are calcium oxalate. This type of stones forms when body either isn't clearing oxalate well enough, is making too much oxalate, or too little citrate. This results in oxalate binding to form crystals, which continue to aggregate and form stones.  Limiting calcium does not help, but limiting oxalate in the diet can help. Increasing citric acid intake may also help.  The following measures may help to prevent the recurrence of stones: Increase water intake to 2-2.5 liters per day May add citrus juice (lemon, lime or orange juice) to water Moderation in dairy foods Decrease in salt content Low Oxalate diet: Oxylates are found in foods like Tomato, Spinach, red wine and chocolate (see additional resources below).  Internet resources for information regarding low oxalate diet:  https://kidneystones.[place].edu/how-to-eat-a-low-oxalate-diet/ https://my.[facility].org/health/articles/11066-kidney-stones-oxalate-controlled-diet Foods Low in Sodium or Oxalate Foods You Can Eat  Drinks Coffee, fruit and veggie juice (using the recommended veggies), fruit punch  Fruits Apples, apricots (fresh or canned), avocado, bananas, cherries (sweet), cranberries, grapefruit, red or green grapes, lemon and lime juice, melons, nectarines, papayas, peaches, pears, pineapples, oranges, strawberries (fresh), tangerines  Veggies Artichokes, asparagus, bamboo shoots, broccoli, brussels sprouts, cabbage, cauliflower, chayote squash, chicory, corn, cucumbers, endive, lettuce, lima beans, mushrooms, onions, peas, peppers, potatoes, radishes, rutabagas, zucchini  Breads, Cereals, Grains Egg noodles, rye bread, cooked and dry cereals without nuts or bran, crackers with unsalted tops, white or wild rice  Meat, Meat Replacements, Fish, Poultry Meat, fish, poultry, eggs, egg whites, egg replacements  Soup Homemade soup (using the recommended veggies and meat), low-sodium bouillon, low-sodium canned   Desserts Cookies, cakes, ice cream, pudding without chocolate or nuts, candy without chocolate or nuts  Fats and Oils Butter, margarine, cream, oil, salad dressing, mayo  Other Foods Unsalted potato chips or pretzels, herbs (like garlic, garlic powder, onion powder), lemon juice, salt-free seasoning blends, vinegar  Other Foods Low in Oxalate Foods You Can Eat  Drinks Beer, cola, wine, buttermilk, lemonade or limeade (without added vitamin C), milk  Meat, Meat Replacements, Fish, Poultry Lunch meat, ham, bacon, hot dogs, bratwurst, sausage, chicken nuggets, cheddar cheese, canned fish and shellfish  Soup Tomato soup, cheese soup  Other Foods Coconuts, lemon or lime juices, sugar or sweeteners, jellies or jams (from the recommended list)   Moderate-Oxalate Foods Foods to Limit   Drinks Fruit and veggie juices (from the list below), chocolate milk, rice milk, hot cocoa, tea   Fruits Blackberries, blueberries, black currants, cherries (sour), fruit cocktail, mangoes, orange peel, prunes, purple plums   Veggies Baked beans, carrots, celery, green beans, parsnips, summer squash, tomatoes, turnips   Breads, Cereals, Grains White bread, cornbread or cornmeal, white English muffins, saltine or soda crackers, brown rice, vanilla wafers, spaghetti and other noodles, firm tofu, bagels, oatmeal   Meat/meat replacements, fish, poultry Sardines   Desserts Chocolate cake   Fats and Oils Macadamia nuts, pistachio nuts, English walnuts   Other Foods Jams or jellies (made with the fruits above), pepper   High-Oxalate Foods Foods to Avoid Drinks Chocolate drink mixes, soy milk, Ovaltine, instant iced tea, fruit juices of fruits listed below Fruits Apricots (dried), red currants, figs, kiwi, plums, rhubarb Veggies Beans (wax, dried), beets and beet greens, chives, collard greens, eggplant, escarole, dark greens of all kinds, leeks, okra, parsley, rutabagas, spinach, Swiss chard, tomato paste,  watercress Breads, Cereals, Grains Amaranth, barley, white corn flour, fried potatoes, fruitcake, grits, soybean products, sweet potatoes, wheat germ and bran, buckwheat flour, All Bran cereal, graham crackers, pretzels, whole wheat bread   Meat/meat replacements, fish, poultry Dried beans, peanut butter, soy burgers, miso Desserts Carob, chocolate, marmalades Fats and Oils Nuts (peanuts, almonds, pecans, cashews, hazelnuts), nut butters, sesame seeds, tahini paste Other Foods Poppy seeds  

## 2023-08-04 ENCOUNTER — Ambulatory Visit: Payer: BC Managed Care – PPO | Admitting: Urology

## 2023-08-17 DIAGNOSIS — R079 Chest pain, unspecified: Secondary | ICD-10-CM | POA: Diagnosis not present

## 2023-08-17 DIAGNOSIS — I10 Essential (primary) hypertension: Secondary | ICD-10-CM | POA: Diagnosis not present

## 2023-08-17 DIAGNOSIS — M545 Low back pain, unspecified: Secondary | ICD-10-CM | POA: Diagnosis not present

## 2023-08-17 DIAGNOSIS — R9431 Abnormal electrocardiogram [ECG] [EKG]: Secondary | ICD-10-CM | POA: Diagnosis not present

## 2023-08-17 DIAGNOSIS — G4733 Obstructive sleep apnea (adult) (pediatric): Secondary | ICD-10-CM | POA: Diagnosis not present

## 2023-08-22 ENCOUNTER — Other Ambulatory Visit: Payer: Self-pay | Admitting: Orthopedic Surgery

## 2023-08-22 DIAGNOSIS — M5416 Radiculopathy, lumbar region: Secondary | ICD-10-CM

## 2023-11-18 DIAGNOSIS — M67431 Ganglion, right wrist: Secondary | ICD-10-CM | POA: Diagnosis not present

## 2023-11-24 ENCOUNTER — Ambulatory Visit: Payer: BC Managed Care – PPO | Admitting: Family Medicine

## 2023-11-24 VITALS — BP 118/77 | HR 74 | Temp 98.3°F | Ht 70.0 in | Wt 269.4 lb

## 2023-11-24 DIAGNOSIS — N200 Calculus of kidney: Secondary | ICD-10-CM | POA: Diagnosis not present

## 2023-11-24 DIAGNOSIS — I10 Essential (primary) hypertension: Secondary | ICD-10-CM

## 2023-11-24 MED ORDER — POTASSIUM CITRATE ER 15 MEQ (1620 MG) PO TBCR
1.0000 | EXTENDED_RELEASE_TABLET | Freq: Two times a day (BID) | ORAL | 3 refills | Status: DC
Start: 2023-11-24 — End: 2024-02-03

## 2023-11-24 NOTE — Patient Instructions (Signed)
Continue your medications.    Follow up in 6 months

## 2023-11-24 NOTE — Assessment & Plan Note (Signed)
Doing well at this time.  Potassium citrate refilled.

## 2023-11-24 NOTE — Progress Notes (Signed)
Subjective:  Patient ID: [name], male    DOB: 1986/11/24  Age: 37 y.o. MRN: [id number]  CC:  Follow up from Urgent care   HPI:  37 year old male with hypertension, GERD, NASH, kidney stones, hyperlipidemia presents for urgent care follow-up.  Patient was recently at urgent care for cyst on his wrist.  His blood pressure was elevated in the 150s systolic.  He was advised to come in for evaluation.  Patient's blood pressure is well-controlled here today.  He is compliant with amlodipine.  Additionally, patient has history of kidney stones.  He states that for some reason he was unable to get refills of his potassium citrate.  Requesting this today.  Patient Active Problem List   Diagnosis Date Noted   Recurrent tonsillitis 03/25/2023   Essential hypertension 11/16/2021   NASH (nonalcoholic steatohepatitis) 12/25/2020   Hyperlipidemia 09/06/2020   Kidney stones 01/30/2020   GERD (gastroesophageal reflux disease) 01/24/2014   Gout 05/05/2013    Social Hx   Social History   Socioeconomic History   Marital status: Married    Spouse name: Not on file   Number of children: 3   Years of education: Not on file   Highest education level: Not on file  Occupational History   Occupation: Therapist, sports: [employer]  Tobacco Use   Smoking status: Never   Smokeless tobacco: Never  Vaping Use   Vaping status: Never Used  Substance and Sexual Activity   Alcohol use: No   Drug use: No   Sexual activity: Not on file  Other Topics Concern   Not on file  Social History Narrative   Not on file   Social Drivers of Health   Financial Resource Strain: Not on file  Food Insecurity: Not on file  Transportation Needs: Not on file  Physical Activity: Not on file  Stress: Not on file  Social Connections: Not on file    Review of Systems Per HPI  Objective:  BP 118/77   Pulse 74   Temp 98.3 F (36.8 C)   Ht 5\' 10"  (1.778 m)   Wt 269 lb 6.4 oz (122.2  kg)   SpO2 98%   BMI 38.65 kg/m      11/24/2023    2:40 PM 08/02/2023    1:15 PM 07/12/2023   12:50 PM  BP/Weight  Systolic BP 118 145 138  Diastolic BP 77 84 98  Wt. (Lbs) 269.4    BMI 38.65 kg/m2      Physical Exam Constitutional:      General: He is not in acute distress.    Appearance: Normal appearance. He is obese.  HENT:     Head: Normocephalic and atraumatic.  Eyes:     General:        Right eye: No discharge.        Left eye: No discharge.     Conjunctiva/sclera: Conjunctivae normal.  Cardiovascular:     Rate and Rhythm: Normal rate and regular rhythm.  Pulmonary:     Effort: Pulmonary effort is normal.     Breath sounds: Normal breath sounds. No wheezing, rhonchi or rales.  Neurological:     Mental Status: He is alert.  Psychiatric:        Mood and Affect: Mood normal.        Behavior: Behavior normal.     Lab Results  Component Value Date   WBC 12.5 (H) 07/09/2023   HGB 17.1 (H)  07/09/2023   HCT 49.1 07/09/2023   PLT 185 07/09/2023   GLUCOSE 112 (H) 07/09/2023   CHOL 178 03/31/2021   TRIG 144 03/31/2021   HDL 34 (L) 03/31/2021   LDLCALC 118 (H) 03/31/2021   ALT 56 (H) 07/30/2021   AST 33 07/30/2021   NA 134 (L) 07/09/2023   K 3.5 07/09/2023   CL 100 07/09/2023   CREATININE 1.52 (H) 07/09/2023   BUN 23 (H) 07/09/2023   CO2 26 07/09/2023     Assessment & Plan:   Problem List Items Addressed This Visit       Cardiovascular and Mediastinum   Essential hypertension - Primary   Stable.  Continue amlodipine.  Advised to monitor BP at home.        Genitourinary   Kidney stones   Doing well at this time.  Potassium citrate refilled.      Relevant Medications   Potassium Citrate (UROCIT-K 15) 15 MEQ (1620 MG) TBCR    Meds ordered this encounter  Medications   Potassium Citrate (UROCIT-K 15) 15 MEQ (1620 MG) TBCR    Sig: Take 1 tablet by mouth in the morning and at bedtime.    Dispense:  180 tablet    Refill:  3    Follow-up:  6  months  Brynli Ollis Adriana Simas DO [facility]

## 2023-11-24 NOTE — Assessment & Plan Note (Signed)
Stable.  Continue amlodipine.  Advised to monitor BP at home.

## 2023-12-12 DIAGNOSIS — M7712 Lateral epicondylitis, left elbow: Secondary | ICD-10-CM | POA: Diagnosis not present

## 2024-01-19 ENCOUNTER — Other Ambulatory Visit: Payer: Self-pay | Admitting: Family Medicine

## 2024-01-19 DIAGNOSIS — I10 Essential (primary) hypertension: Secondary | ICD-10-CM

## 2024-01-24 DIAGNOSIS — M7712 Lateral epicondylitis, left elbow: Secondary | ICD-10-CM | POA: Diagnosis not present

## 2024-02-02 ENCOUNTER — Ambulatory Visit (HOSPITAL_COMMUNITY)
Admission: RE | Admit: 2024-02-02 | Discharge: 2024-02-02 | Disposition: A | Discharge status: Home / Self Care | Payer: BC Managed Care – PPO | Source: Ambulatory Visit | Attending: Urology | Admitting: Urology

## 2024-02-02 DIAGNOSIS — N200 Calculus of kidney: Secondary | ICD-10-CM | POA: Diagnosis not present

## 2024-02-02 DIAGNOSIS — N281 Cyst of kidney, acquired: Secondary | ICD-10-CM | POA: Diagnosis not present

## 2024-02-03 ENCOUNTER — Ambulatory Visit: Payer: BC Managed Care – PPO | Admitting: Urology

## 2024-02-03 VITALS — BP 136/86 | HR 88

## 2024-02-03 DIAGNOSIS — Z87442 Personal history of urinary calculi: Secondary | ICD-10-CM | POA: Diagnosis not present

## 2024-02-03 DIAGNOSIS — Z09 Encounter for follow-up examination after completed treatment for conditions other than malignant neoplasm: Secondary | ICD-10-CM | POA: Diagnosis not present

## 2024-02-03 DIAGNOSIS — M1009 Idiopathic gout, multiple sites: Secondary | ICD-10-CM

## 2024-02-03 DIAGNOSIS — N200 Calculus of kidney: Secondary | ICD-10-CM

## 2024-02-03 MED ORDER — POTASSIUM CITRATE ER 15 MEQ (1620 MG) PO TBCR: 1.0000 | EXTENDED_RELEASE_TABLET | Freq: Two times a day (BID) | ORAL | 3 refills | Status: DC

## 2024-02-03 MED ORDER — ALLOPURINOL 300 MG PO TABS: 300.0000 mg | ORAL_TABLET | Freq: Every day | ORAL | 3 refills | Status: DC

## 2024-02-03 NOTE — Progress Notes (Signed)
 02/03/2024 12:16 PM   Liana Gerold 02-23-1986 [id number]  Referring provider: Tommie Sams, DO [address] [place],  [place] [address]  Followup nephrolithiasis   HPI: Mr [name] is a 38yo here for followup for nephrolithiasis. He has passed 4 stone since last visit. He is on potassium citrate BID and allopurinol 300mg  daily. No flank pain. Renal US shows no calculi   PMH: Past Medical History:  Diagnosis Date   Acid reflux    Arthritis    Complication of anesthesia    hard to wake up after anesthesia   Elevated LFTs    mildly elevated transaminases in past, now normalized    GERD (gastroesophageal reflux disease)    Gout    History of kidney stones    Hypertension     Surgical History: Past Surgical History:  Procedure Laterality Date   APPENDECTOMY  01/27/2015   BIOPSY  12/10/2019   Procedure: BIOPSY;  Surgeon: Malissa Hippo, MD;  [facility];  Service: Endoscopy;;  esophagus gastric   CHOLECYSTECTOMY     COLONOSCOPY WITH ESOPHAGOGASTRODUODENOSCOPY (EGD) N/A 02/25/2014   NL TI, 2 SIMPLE ADENOMAS(1:>1 CM), LGE IH-FG POLYPS, PROMINENT AMPULLA. Needs Colonoscopy surveillance in 2018   COLONOSCOPY WITH PROPOFOL N/A 09/11/2021   Procedure: COLONOSCOPY WITH PROPOFOL;  Surgeon: Dolores Frame, MD;  [facility];  Service: Gastroenterology;  Laterality: N/A;  10:35   ESOPHAGEAL DILATION N/A 08/30/2018   Procedure: ESOPHAGEAL DILATION;  Surgeon: Malissa Hippo, MD;  [facility];  Service: Endoscopy;  Laterality: N/A;   ESOPHAGEAL DILATION N/A 12/10/2019   Procedure: ESOPHAGEAL DILATION;  Surgeon: Malissa Hippo, MD;  [facility];  Service: Endoscopy;  Laterality: N/A;   ESOPHAGOGASTRODUODENOSCOPY N/A 04/29/2015   Dr. Darrick Penna: 1. mild non-erosive gastritis (inflammation) was found in the gastric antrum. 2. Prominent Ampullla 6mm. benign path with pyloric metaplasia   ESOPHAGOGASTRODUODENOSCOPY N/A  07/12/2016   Procedure: ESOPHAGOGASTRODUODENOSCOPY (EGD);  Surgeon: West Bali, MD;  [facility];  Service: Endoscopy;  Laterality: N/A;  830   ESOPHAGOGASTRODUODENOSCOPY N/A 08/30/2018   Procedure: ESOPHAGOGASTRODUODENOSCOPY (EGD);  Surgeon: Malissa Hippo, MD;  [facility];  Service: Endoscopy;  Laterality: N/A;  2:00   ESOPHAGOGASTRODUODENOSCOPY (EGD) WITH PROPOFOL N/A 12/10/2019   Procedure: ESOPHAGOGASTRODUODENOSCOPY (EGD) WITH PROPOFOL;  Surgeon: Malissa Hippo, MD;  [facility];  Service: Endoscopy;  Laterality: N/A;  12:55   EUS N/A 03/20/2014   Dr. Dulce Sellar: prominent major papilla s/p biopsy, minor papilla without clear adenomatous or mass-like appearance, chronic duodenitis on path.    EXTRACORPOREAL SHOCK WAVE LITHOTRIPSY Right 07/12/2023   Procedure: EXTRACORPOREAL SHOCK WAVE LITHOTRIPSY (ESWL);  Surgeon: Malen Gauze, MD;  [facility];  Service: Urology;  Laterality: Right;  Thersa Mohiuddin has a surgery before this case.  Office to tell pt to arrive at 10:30 and NPO after midnight.   INCISIONAL HERNIA REPAIR N/A 05/02/2019   Procedure: HERNIA REPAIR OPEN INCISIONAL WITH MESH;  Surgeon: Lucretia Roers, MD;  [facility];  Service: General;  Laterality: N/A;   KIDNEY STONE SURGERY  age 18 and age 11   lithotripsy, stent   POLYPECTOMY  12/10/2019   Procedure: POLYPECTOMY;  Surgeon: Malissa Hippo, MD;  [facility];  Service: Endoscopy;;  gastric   POLYPECTOMY  09/11/2021   Procedure: POLYPECTOMY INTESTINAL;  Surgeon: Dolores Frame, MD;  [facility];  Service: Gastroenterology;;  SAVORY DILATION N/A 07/12/2016   Procedure: SAVORY DILATION;  Surgeon: West Bali, MD;  [facility];  Service: Endoscopy;  Laterality: N/A;   VENTRAL HERNIA REPAIR N/A 11/28/2019   Procedure: LAPAROSCOPIC VENTRAL HERNIA WITH MESH;  Surgeon: Lucretia Roers, MD;  [facility];  Service: General;   Laterality: N/A;   WISDOM TOOTH EXTRACTION      Home Medications:  Allergies as of 02/03/2024       Reactions   Phenergan [promethazine Hcl] Swelling   arn swelled and hurt for 3 days, pt would rather not have        Medication List        Accurate as of February 03, 2024 12:16 PM. If you have any questions, ask your nurse or doctor.          allopurinol 300 MG tablet Commonly known as: ZYLOPRIM Take 1 tablet (300 mg total) by mouth at bedtime.   amLODipine 5 MG tablet Commonly known as: NORVASC TAKE 1 TABLET BY MOUTH AT BEDTIME   pantoprazole 40 MG tablet Commonly known as: PROTONIX Take 1 tablet by mouth once daily   Potassium Citrate 15 MEQ (1620 MG) Tbcr Commonly known as: Urocit-K 15 Take 1 tablet by mouth in the morning and at bedtime.        Allergies:  Allergies  Allergen Reactions   Phenergan [Promethazine Hcl] Swelling    arn swelled and hurt for 3 days, pt would rather not have    Family History: Family History  Problem Relation Age of Onset   Diabetes Father    COPD Father    Colon cancer Neg Hx        does not know paternal side    Social History:  reports that he has never smoked. He has never used smokeless tobacco. He reports that he does not drink alcohol and does not use drugs.  ROS: All other review of systems were reviewed and are negative except what is noted above in HPI  Physical Exam: BP 136/86   Pulse 88   Constitutional:  Alert and oriented, No acute distress. HEENT: [place] AT, moist mucus membranes.  Trachea midline, no masses. Cardiovascular: No clubbing, cyanosis, or edema. Respiratory: Normal respiratory effort, no increased work of breathing. GI: Abdomen is soft, nontender, nondistended, no abdominal masses GU: No CVA tenderness.  Lymph: No cervical or inguinal lymphadenopathy. Skin: No rashes, bruises or suspicious lesions. Neurologic: Grossly intact, no focal deficits, moving all 4 extremities. Psychiatric:  Normal mood and affect.  Laboratory Data: Lab Results  Component Value Date   WBC 12.5 (H) 07/09/2023   HGB 17.1 (H) 07/09/2023   HCT 49.1 07/09/2023   MCV 89.9 07/09/2023   PLT 185 07/09/2023    Lab Results  Component Value Date   CREATININE 1.52 (H) 07/09/2023    No results found for: "PSA"  No results found for: "TESTOSTERONE"  No results found for: "HGBA1C"  Urinalysis    Component Value Date/Time   COLORURINE YELLOW 07/09/2023 0917   APPEARANCEUR Clear 08/02/2023 1319   LABSPEC 1.017 07/09/2023 0917   PHURINE 6.0 07/09/2023 0917   GLUCOSEU Negative 08/02/2023 1319   HGBUR SMALL (A) 07/09/2023 0917   BILIRUBINUR Negative 08/02/2023 1319   KETONESUR NEGATIVE 07/09/2023 0917   PROTEINUR Negative 08/02/2023 1319   PROTEINUR NEGATIVE 07/09/2023 0917   UROBILINOGEN negative (A) 01/30/2020 1421   UROBILINOGEN 0.2 02/24/2009 0912   NITRITE Negative 08/02/2023 1319   NITRITE NEGATIVE 07/09/2023 1610  LEUKOCYTESUR Trace (A) 08/02/2023 1319   LEUKOCYTESUR NEGATIVE 07/09/2023 2956    Lab Results  Component Value Date   LABMICR See below: 08/02/2023   WBCUA 0-5 08/02/2023   LABEPIT 0-10 08/02/2023   BACTERIA None seen 08/02/2023    Pertinent Imaging: Renal US 02/02/2024: Images reviewed and discussed with the patient  Results for orders placed during the hospital encounter of 08/02/23  DG Abd 1 View  Narrative CLINICAL DATA:  Follow up kidney stone  EXAM: ABDOMEN - 1 VIEW  COMPARISON:  07/12/2023.  FINDINGS: The bowel gas pattern is normal. No radio-opaque calculi or other significant radiographic abnormality are seen. Mesh consistent with previous abdominal wall repair. Right upper quadrant cholecystectomy clips.  IMPRESSION: Barely perceptible punctate calcifications are present consistent with nephrolithiasis which could be confirmed with noncontrast CT.   Electronically Signed By: Layla Maw M.D. On: 08/05/2023 22:17  No results found  for this or any previous visit.  No results found for this or any previous visit.  No results found for this or any previous visit.  Results for orders placed during the hospital encounter of 02/02/24  US RENAL  Narrative : PROCEDURE: US RENAL  HISTORY: Patient is a 38 y/o  M with follow-up renal stones.  COMPARISON: U/S renal 03/17/2023, 08/30/2022.  TECHNIQUE: Two-dimensional grayscale and color Doppler ultrasound of the kidneys was performed.  FINDINGS: The urinary bladder demonstrates normal anechoic echogenicity. The right ureteral jet is visualized.  The right kidney measures 12.7 x 6.0 x 5.8 cm. Renal cortical echotexture is within normal limits. There is no hydronephrosis. There are no stones. There are no cysts.  The left kidney measures 13.2 x 5.2 x 5.1 cm. Renal cortical echotexture is within normal limits. There is no hydronephrosis. There are no stones. There is a simple cyst measuring 1.5 cm arising from the inferior pole.  Fatty liver is incidentally noted.  IMPRESSION: 1. Simple left renal cyst, otherwise unremarkable ultrasound of the kidneys.  2.  The left ureteral jet is not visualized.  3.  Diffuse hepatic steatosis.  Thank you for allowing [place] to assist in the care of this patient.   Electronically Signed By: Lestine Box M.D. On: 02/02/2024 21:13  No results found for this or any previous visit.  No results found for this or any previous visit.  No results found for this or any previous visit.   Assessment & Plan:    1. Kidney stones (Primary) Continue potassium citrate BID and allopurinol 300mg  daily Followup 6 monhts with renal US and [name] - Urinalysis, Routine w reflex microscopic   No follow-ups on file.  Wilkie Aye, MD  [facility] [place]

## 2024-02-06 LAB — URINALYSIS, ROUTINE W REFLEX MICROSCOPIC
Bilirubin, UA: NEGATIVE
Glucose, UA: NEGATIVE
Ketones, UA: NEGATIVE
Leukocytes,UA: NEGATIVE
Nitrite, UA: NEGATIVE
Protein,UA: NEGATIVE
RBC, UA: NEGATIVE
Specific Gravity, UA: 1.03 (ref 1.005–1.030)
Urobilinogen, Ur: 0.2 mg/dL (ref 0.2–1.0)
pH, UA: 6 (ref 5.0–7.5)

## 2024-02-07 ENCOUNTER — Encounter: Payer: Self-pay | Admitting: Urology

## 2024-02-07 NOTE — Patient Instructions (Signed)

## 2024-02-08 DIAGNOSIS — M9903 Segmental and somatic dysfunction of lumbar region: Secondary | ICD-10-CM | POA: Diagnosis not present

## 2024-02-08 DIAGNOSIS — M546 Pain in thoracic spine: Secondary | ICD-10-CM | POA: Diagnosis not present

## 2024-02-08 DIAGNOSIS — M9902 Segmental and somatic dysfunction of thoracic region: Secondary | ICD-10-CM | POA: Diagnosis not present

## 2024-02-08 DIAGNOSIS — M62830 Muscle spasm of back: Secondary | ICD-10-CM | POA: Diagnosis not present

## 2024-02-09 DIAGNOSIS — S61432A Puncture wound without foreign body of left hand, initial encounter: Secondary | ICD-10-CM | POA: Diagnosis not present

## 2024-02-09 DIAGNOSIS — Z23 Encounter for immunization: Secondary | ICD-10-CM | POA: Diagnosis not present

## 2024-02-15 DIAGNOSIS — M9902 Segmental and somatic dysfunction of thoracic region: Secondary | ICD-10-CM | POA: Diagnosis not present

## 2024-02-15 DIAGNOSIS — M9903 Segmental and somatic dysfunction of lumbar region: Secondary | ICD-10-CM | POA: Diagnosis not present

## 2024-02-15 DIAGNOSIS — M546 Pain in thoracic spine: Secondary | ICD-10-CM | POA: Diagnosis not present

## 2024-02-15 DIAGNOSIS — M62830 Muscle spasm of back: Secondary | ICD-10-CM | POA: Diagnosis not present

## 2024-02-20 DIAGNOSIS — M25531 Pain in right wrist: Secondary | ICD-10-CM | POA: Diagnosis not present

## 2024-02-27 DIAGNOSIS — M25531 Pain in right wrist: Secondary | ICD-10-CM | POA: Diagnosis not present

## 2024-02-27 DIAGNOSIS — M67431 Ganglion, right wrist: Secondary | ICD-10-CM | POA: Diagnosis not present

## 2024-03-04 ENCOUNTER — Other Ambulatory Visit: Payer: Self-pay | Admitting: Family Medicine

## 2024-03-04 DIAGNOSIS — K219 Gastro-esophageal reflux disease without esophagitis: Secondary | ICD-10-CM

## 2024-03-05 ENCOUNTER — Other Ambulatory Visit: Payer: Self-pay

## 2024-03-05 DIAGNOSIS — K219 Gastro-esophageal reflux disease without esophagitis: Secondary | ICD-10-CM

## 2024-03-05 MED ORDER — PANTOPRAZOLE SODIUM 40 MG PO TBEC: 40.0000 mg | DELAYED_RELEASE_TABLET | Freq: Every day | ORAL | 1 refills | Status: DC

## 2024-03-08 ENCOUNTER — Encounter (INDEPENDENT_AMBULATORY_CARE_PROVIDER_SITE_OTHER): Payer: Self-pay | Admitting: Gastroenterology

## 2024-03-08 ENCOUNTER — Ambulatory Visit (INDEPENDENT_AMBULATORY_CARE_PROVIDER_SITE_OTHER): Payer: BC Managed Care – PPO | Admitting: Gastroenterology

## 2024-03-08 VITALS — BP 126/80 | HR 71 | Temp 98.0°F | Ht 71.0 in | Wt 266.3 lb

## 2024-03-08 DIAGNOSIS — Z1159 Encounter for screening for other viral diseases: Secondary | ICD-10-CM | POA: Diagnosis not present

## 2024-03-08 DIAGNOSIS — K649 Unspecified hemorrhoids: Secondary | ICD-10-CM

## 2024-03-08 DIAGNOSIS — R7989 Other specified abnormal findings of blood chemistry: Secondary | ICD-10-CM | POA: Diagnosis not present

## 2024-03-08 DIAGNOSIS — Z860100 Personal history of colon polyps, unspecified: Secondary | ICD-10-CM | POA: Diagnosis not present

## 2024-03-08 MED ORDER — HYDROCORTISONE ACETATE 25 MG RE SUPP: 25.0000 mg | Freq: Two times a day (BID) | RECTAL | 0 refills | Status: DC

## 2024-03-08 NOTE — Progress Notes (Signed)
 [name], M.D. Gastroenterology & Hepatology [facility] [address] [place], [place] [address]  Primary Care Physician: Dustin Sams, DO [address] [place] [address]  I will communicate my assessment and recommendations to the referring MD via EMR.  Problems: Recurrent rectal bleeding due to external hemorrhoids. Elevated LFTs.  History of Present Illness: [name] is a 38 y.o. male with PMH arthritis, GERD, gout, kidney stones, HTN and hemorrhoids, who presents for follow up of rectal bleeding and elevated LFTs.  The patient was last seen on 12/23/2022. At that time, the patient had evaluation for diarrhea after visiting [place].  GI pathogen panel and ova and parasite were ordered but were not performed.  Patient states that 2 months ago he was presenting recurrent episodes of rectal bleeding when he defecated. This was happening with every BM and lasted for almost a month. Did not do any topical therapy. Has subsided since then. No rectal pain. The patient denies having any nausea, vomiting, fever, chills, ,melena, hematemesis, abdominal distention, abdominal pain, diarrhea, jaundice, pruritus. Has gained 10 lb in last 6-8 months.  Does not drink alcohol.  Most recent CT of the abdomen and pelvis without contrast on 07/08/2023 showed fatty liver and nephrolithiasis.  Last Colonoscopy:08/2021  - Hemorrhoids found on perianal exam. - Three 4 to 5 mm polyps in the descending colon-2 TAs   - A tattoo was seen in the distal sigmoid colon.  The tattoo site appeared normal. - The distal rectum and anal verge are normal on retroflexion view.  Last Endoscopy:12/20 - Normal esophagus. Biopsied.  - Z-line irregular, 38 cm from the incisors. - 2 cm hiatal hernia.  - No endoscopic esophageal abnormality to explain patient's dysphagia. Esophagus dilated. Biopsied. - Multiple gastric polyps. Biopsied.(negative for H. pylori  infection. Gastric polyps are fundic gland polyps.Esophageal biopsy shows changes of GERD and no EoE.)  - Nodular mucosa in the prepyloric region of the stomach. Biopsied. - Normal duodenal bulb and second portion of the duodenum.   Recommendations:  Repeat colonoscopy in 08/2022  Past Medical History: Past Medical History:  Diagnosis Date   Acid reflux    Arthritis    Complication of anesthesia    hard to wake up after anesthesia   Elevated LFTs    mildly elevated transaminases in past, now normalized    GERD (gastroesophageal reflux disease)    Gout    History of kidney stones    Hypertension     Past Surgical History: Past Surgical History:  Procedure Laterality Date   APPENDECTOMY  01/27/2015   BIOPSY  12/10/2019   Procedure: BIOPSY;  Surgeon: Dustin Hippo, MD;  [facility];  Service: Endoscopy;;  esophagus gastric   CHOLECYSTECTOMY     COLONOSCOPY WITH ESOPHAGOGASTRODUODENOSCOPY (EGD) N/A 02/25/2014   NL TI, 2 SIMPLE ADENOMAS(1:>1 CM), LGE IH-FG POLYPS, PROMINENT AMPULLA. Needs Colonoscopy surveillance in 2018   COLONOSCOPY WITH PROPOFOL N/A 09/11/2021   Procedure: COLONOSCOPY WITH PROPOFOL;  Surgeon: Dustin Frame, MD;  [facility];  Service: Gastroenterology;  Laterality: N/A;  10:35   ESOPHAGEAL DILATION N/A 08/30/2018   Procedure: ESOPHAGEAL DILATION;  Surgeon: Dustin Hippo, MD;  [facility];  Service: Endoscopy;  Laterality: N/A;   ESOPHAGEAL DILATION N/A 12/10/2019   Procedure: ESOPHAGEAL DILATION;  Surgeon: Dustin Hippo, MD;  [facility];  Service: Endoscopy;  Laterality: N/A;   ESOPHAGOGASTRODUODENOSCOPY N/A 04/29/2015  Dr. Darrick Thomas: 1. mild non-erosive gastritis (inflammation) was found in the gastric antrum. 2. Prominent Ampullla 6mm. benign path with pyloric metaplasia   ESOPHAGOGASTRODUODENOSCOPY N/A 07/12/2016   Procedure: ESOPHAGOGASTRODUODENOSCOPY (EGD);  Surgeon: Dustin Bali, MD;   [facility];  Service: Endoscopy;  Laterality: N/A;  830   ESOPHAGOGASTRODUODENOSCOPY N/A 08/30/2018   Procedure: ESOPHAGOGASTRODUODENOSCOPY (EGD);  Surgeon: Dustin Hippo, MD;  [facility];  Service: Endoscopy;  Laterality: N/A;  2:00   ESOPHAGOGASTRODUODENOSCOPY (EGD) WITH PROPOFOL N/A 12/10/2019   Procedure: ESOPHAGOGASTRODUODENOSCOPY (EGD) WITH PROPOFOL;  Surgeon: Dustin Hippo, MD;  [facility];  Service: Endoscopy;  Laterality: N/A;  12:55   EUS N/A 03/20/2014   Dr. Dulce Thomas: prominent major papilla s/p biopsy, minor papilla without clear adenomatous or mass-like appearance, chronic duodenitis on path.    EXTRACORPOREAL SHOCK WAVE LITHOTRIPSY Right 07/12/2023   Procedure: EXTRACORPOREAL SHOCK WAVE LITHOTRIPSY (ESWL);  Surgeon: Dustin Gauze, MD;  [facility];  Service: Urology;  Laterality: Right;  [name] has a surgery before this case.  Office to tell pt to arrive at 10:30 and NPO after midnight.   INCISIONAL HERNIA REPAIR N/A 05/02/2019   Procedure: HERNIA REPAIR OPEN INCISIONAL WITH MESH;  Surgeon: Dustin Roers, MD;  [facility]: [facility];  Service: General;  Laterality: N/A;   KIDNEY STONE SURGERY  age 73 and age 38   lithotripsy, stent   POLYPECTOMY  12/10/2019   Procedure: POLYPECTOMY;  Surgeon: Dustin Hippo, MD;  [facility];  Service: Endoscopy;;  gastric   POLYPECTOMY  09/11/2021   Procedure: POLYPECTOMY INTESTINAL;  Surgeon: Dustin Frame, MD;  [facility];  Service: Gastroenterology;;   [name] DILATION N/A 07/12/2016   Procedure: [name] DILATION;  Surgeon: Dustin Bali, MD;  [facility];  Service: Endoscopy;  Laterality: N/A;   VENTRAL HERNIA REPAIR N/A 11/28/2019   Procedure: LAPAROSCOPIC VENTRAL HERNIA WITH MESH;  Surgeon: Dustin Roers, MD;  [facility]: [facility];  Service: General;  Laterality: N/A;   WISDOM TOOTH EXTRACTION      Family History: Family History  Problem  Relation Age of Onset   Diabetes Father    COPD Father    Colon cancer Neg Hx        does not know paternal side    Social History: Social History   Tobacco Use  Smoking Status Never  Smokeless Tobacco Never   Social History   Substance and Sexual Activity  Alcohol Use No   Social History   Substance and Sexual Activity  Drug Use No    Allergies: Allergies  Allergen Reactions   Phenergan [Promethazine Hcl] Swelling    arn swelled and hurt for 3 days, pt would rather not have    Medications: Current Outpatient Medications  Medication Sig Dispense Refill   allopurinol (ZYLOPRIM) 300 MG tablet Take 1 tablet (300 mg total) by mouth at bedtime. 90 tablet 3   amLODipine (NORVASC) 5 MG tablet TAKE 1 TABLET BY MOUTH AT BEDTIME 90 tablet 0   pantoprazole (PROTONIX) 40 MG tablet Take 1 tablet (40 mg total) by mouth daily. 90 tablet 1   Potassium Citrate (UROCIT-K 15) 15 MEQ (1620 MG) TBCR Take 1 tablet by mouth in the morning and at bedtime. 180 tablet 3   No current facility-administered medications for this visit.    Review of Systems: GENERAL: negative for malaise, night sweats HEENT: No changes in hearing or vision, no nose bleeds  or other nasal problems. NECK: Negative for lumps, goiter, pain and significant neck swelling RESPIRATORY: Negative for cough, wheezing CARDIOVASCULAR: Negative for chest pain, leg swelling, palpitations, orthopnea GI: SEE HPI MUSCULOSKELETAL: Negative for joint pain or swelling, back pain, and muscle pain. SKIN: Negative for lesions, rash PSYCH: Negative for sleep disturbance, mood disorder and recent psychosocial stressors. HEMATOLOGY Negative for prolonged bleeding, bruising easily, and swollen nodes. ENDOCRINE: Negative for cold or heat intolerance, polyuria, polydipsia and goiter. NEURO: negative for tremor, gait imbalance, syncope and seizures. The remainder of the review of systems is noncontributory.   Physical Exam: BP 126/80  (BP Location: Left Arm, Patient Position: Sitting, Cuff Size: Large)   Pulse 71   Temp 98 F (36.7 C) (Temporal)   Ht 5\' 11"  (1.803 m)   Wt 266 lb 4.8 oz (120.8 kg)   BMI 37.14 kg/m  GENERAL: The patient is AO x3, in no acute distress. HEENT: Head is normocephalic and atraumatic. EOMI are intact. Mouth is well hydrated and without lesions. NECK: Supple. No masses LUNGS: Clear to auscultation. No presence of rhonchi/wheezing/rales. Adequate chest expansion HEART: RRR, normal s1 and s2. ABDOMEN: Soft, nontender, no guarding, no peritoneal signs, and nondistended. BS +. No masses. RECTAL EXAM: deferred EXTREMITIES: Without any cyanosis, clubbing, rash, lesions or edema. NEUROLOGIC: AOx3, no focal motor deficit. SKIN: no jaundice, no rashes  Imaging/Labs: as above  I personally reviewed and interpreted the available labs, imaging and endoscopic files.  Impression and Plan: [name] is a 38 y.o. male with PMH arthritis, GERD, gout, kidney stones, HTN and hemorrhoids, who presents for follow up of rectal bleeding and elevated LFTs.  Patient presented some episodes of rectal bleeding that lasted for close to a month, which fortunately has spontaneously resolved. This likely corresponded to hemorrhoidal bleeding given recent colonoscopy findings. As he is currently asymptomatic, he does not need any treatment but I will send Anusol suppositories if he presents recurrent symptoms. He will be due in 08/2024 for repeat colonoscopy given history of colon polyps.  Finally, he has presented chronically elevated LFTs. Previous imaging suggested fatty liver changes and the elevation could be related to NASH. We will check serologies for autoimmune, viral and metabolic etiologies. He will benefit from losing weight and Mediterranean diet use.  - CBC, CMP, IgG, ANA, ASMA, A1AT, ceruloplasmin, acute hepatitis panel, hep A/B antibodies, iron panel -Explained to the patient the etiology and  consequences of his current liver disease. Patient was counseled about the benefit of implementing a Mediterranean diet and exercise plan to decrease at least 5% of weight. The patient understood about the importance of lifestyle changes to potentially reverse his liver involvement. -Use Anusol suppositories twice a day for 7 days if presenting recurrent rectal bleeding episodes  All questions were answered.      Dustin Blazing, MD Gastroenterology and Hepatology [facility]

## 2024-03-08 NOTE — Patient Instructions (Addendum)
 Perform blood workup Explained to the patient the etiology and consequences of his current liver disease. Patient was counseled about the benefit of implementing a Mediterranean diet and exercise plan to decrease at least 5% of weight. The patient understood about the importance of lifestyle changes to potentially reverse his liver involvement. Use Anusol suppositories twice a day for 7 days if presenting recurrent rectal bleeding episodes

## 2024-03-19 LAB — COMPREHENSIVE METABOLIC PANEL WITH GFR
AG Ratio: 1.8 (calc) (ref 1.0–2.5)
ALT: 53 U/L — ABNORMAL HIGH (ref 9–46)
AST: 32 U/L (ref 10–40)
Albumin: 4.6 g/dL (ref 3.6–5.1)
Alkaline phosphatase (APISO): 69 U/L (ref 36–130)
BUN: 17 mg/dL (ref 7–25)
CO2: 26 mmol/L (ref 20–32)
Calcium: 9.4 mg/dL (ref 8.6–10.3)
Chloride: 106 mmol/L (ref 98–110)
Creat: 1.19 mg/dL (ref 0.60–1.26)
Globulin: 2.6 g/dL (ref 1.9–3.7)
Glucose, Bld: 79 mg/dL (ref 65–99)
Potassium: 4.1 mmol/L (ref 3.5–5.3)
Sodium: 141 mmol/L (ref 135–146)
Total Bilirubin: 0.7 mg/dL (ref 0.2–1.2)
Total Protein: 7.2 g/dL (ref 6.1–8.1)
eGFR: 81 mL/min/{1.73_m2} (ref >=60)

## 2024-03-19 LAB — CBC WITH DIFFERENTIAL/PLATELET
Absolute Lymphocytes: 2173 {cells}/uL (ref 850–3900)
Absolute Monocytes: 398 {cells}/uL (ref 200–950)
Basophils Absolute: 37 {cells}/uL (ref 0–200)
Basophils Relative: 0.7 %
Eosinophils Absolute: 138 {cells}/uL (ref 15–500)
Eosinophils Relative: 2.6 %
HCT: 45.2 % (ref 38.5–50.0)
Hemoglobin: 15.7 g/dL (ref 13.2–17.1)
MCH: 30.8 pg (ref 27.0–33.0)
MCHC: 34.7 g/dL (ref 32.0–36.0)
MCV: 88.8 fL (ref 80.0–100.0)
MPV: 9.3 fL (ref 7.5–12.5)
Monocytes Relative: 7.5 %
Neutro Abs: 2555 {cells}/uL (ref 1500–7800)
Neutrophils Relative %: 48.2 %
Platelets: 221 10*3/uL (ref 140–400)
RBC: 5.09 10*6/uL (ref 4.20–5.80)
RDW: 12.5 % (ref 11.0–15.0)
Total Lymphocyte: 41 %
WBC: 5.3 10*3/uL (ref 3.8–10.8)

## 2024-03-19 LAB — IRON,TIBC AND FERRITIN PANEL
%SAT: 36 % (ref 20–48)
Ferritin: 150 ng/mL (ref 38–380)
Iron: 132 ug/dL (ref 50–180)
TIBC: 369 ug/dL (ref 250–425)

## 2024-03-19 LAB — ANA: Anti Nuclear Antibody (ANA): POSITIVE — AB

## 2024-03-19 LAB — HEPATITIS PANEL, ACUTE
Hep A IgM: NONREACTIVE
Hep B C IgM: NONREACTIVE
Hepatitis B Surface Ag: NONREACTIVE
Hepatitis C Ab: NONREACTIVE

## 2024-03-19 LAB — HEPATITIS A ANTIBODY, TOTAL: Hepatitis A AB,Total: NONREACTIVE

## 2024-03-19 LAB — HEPATITIS B SURFACE ANTIBODY,QUALITATIVE: Hep B S Ab: NONREACTIVE

## 2024-03-19 LAB — ALPHA-1 ANTITRYPSIN PHENOTYPE: ALPHA-1-ANTITRYPSIN (AAT) PHENOTYPE: 101 mg/dL (ref 83–199)

## 2024-03-19 LAB — ANTI-NUCLEAR AB-TITER (ANA TITER): ANA Titer 1: 1:40 {titer} — ABNORMAL HIGH

## 2024-03-19 LAB — CERULOPLASMIN: Ceruloplasmin: 19 mg/dL (ref 14–30)

## 2024-03-19 LAB — ANTI-SMOOTH MUSCLE ANTIBODY, IGG: Actin (Smooth Muscle) Antibody (IGG): <20 U (ref <20)

## 2024-03-19 LAB — IGG: IgG (Immunoglobin G), Serum: 1334 mg/dL (ref 600–1640)

## 2024-03-23 ENCOUNTER — Ambulatory Visit: Payer: BC Managed Care – PPO | Admitting: Urology

## 2024-04-23 ENCOUNTER — Other Ambulatory Visit: Payer: Self-pay | Admitting: Family Medicine

## 2024-04-23 DIAGNOSIS — M79672 Pain in left foot: Secondary | ICD-10-CM | POA: Diagnosis not present

## 2024-04-23 DIAGNOSIS — I10 Essential (primary) hypertension: Secondary | ICD-10-CM

## 2024-04-23 DIAGNOSIS — M7742 Metatarsalgia, left foot: Secondary | ICD-10-CM | POA: Diagnosis not present

## 2024-04-23 DIAGNOSIS — M7712 Lateral epicondylitis, left elbow: Secondary | ICD-10-CM | POA: Diagnosis not present

## 2024-05-04 DIAGNOSIS — M7712 Lateral epicondylitis, left elbow: Secondary | ICD-10-CM | POA: Diagnosis not present

## 2024-05-24 ENCOUNTER — Ambulatory Visit: Payer: BC Managed Care – PPO | Admitting: Family Medicine

## 2024-05-24 VITALS — BP 138/86 | Ht 71.0 in | Wt 266.7 lb

## 2024-05-24 DIAGNOSIS — K219 Gastro-esophageal reflux disease without esophagitis: Secondary | ICD-10-CM | POA: Diagnosis not present

## 2024-05-24 DIAGNOSIS — K7581 Nonalcoholic steatohepatitis (NASH): Secondary | ICD-10-CM | POA: Diagnosis not present

## 2024-05-24 DIAGNOSIS — I10 Essential (primary) hypertension: Secondary | ICD-10-CM | POA: Diagnosis not present

## 2024-05-24 MED ORDER — AMLODIPINE BESYLATE 5 MG PO TABS: 5.0000 mg | ORAL_TABLET | Freq: Every day | ORAL | 3 refills | Status: AC

## 2024-05-24 NOTE — Patient Instructions (Signed)
Medication refilled.  Follow up in 6 months.  Take care  Dr. Lacinda Axon

## 2024-05-25 NOTE — Progress Notes (Signed)
 Subjective:  Patient ID: [name], male    DOB: Apr 04, 1986  Age: 38 y.o. MRN: [id number]  CC:   Chief Complaint  Patient presents with   Follow-up    HPI:  38 year old male with hypertension, NASH, GERD, hyperlipidemia, gout presents for follow-up.  Patient currently battling chronic lateral condyle lightest of the left elbow.  Following with orthopedics.  Hypertension fairly well-controlled on amlodipine .  Needs refill.  GERD stable on Protonix .  Patient following closely with GI regarding NASH.  Patient Active Problem List   Diagnosis Date Noted   Hemorrhoids 03/08/2024   Essential hypertension 11/16/2021   NASH (nonalcoholic steatohepatitis) 12/25/2020   Hyperlipidemia 09/06/2020   Kidney stones 01/30/2020   GERD (gastroesophageal reflux disease) 01/24/2014   Gout 05/05/2013    Social Hx   Social History   Socioeconomic History   Marital status: Married    Spouse name: Not on file   Number of children: 3   Years of education: Not on file   Highest education level: Not on file  Occupational History   Occupation: Therapist, sports: [employer]  Tobacco Use   Smoking status: Never   Smokeless tobacco: Never  Vaping Use   Vaping status: Never Used  Substance and Sexual Activity   Alcohol use: No   Drug use: No   Sexual activity: Not on file  Other Topics Concern   Not on file  Social History Narrative   Not on file   Social Drivers of Health   Financial Resource Strain: Not on file  Food Insecurity: Not on file  Transportation Needs: Not on file  Physical Activity: Not on file  Stress: Not on file  Social Connections: Not on file    Review of Systems  Respiratory: Negative.    Cardiovascular: Negative.    Objective:  BP 138/86   Ht 5' 11 (1.803 m)   Wt 266 lb 11.2 oz (121 kg)   BMI 37.20 kg/m      05/24/2024    3:00 PM 03/08/2024   10:52 AM 02/03/2024   12:02 PM  BP/Weight  Systolic BP 138 126 136  Diastolic BP  86 80 86  Wt. (Lbs) 266.7 266.3   BMI 37.2 kg/m2 37.14 kg/m2     Physical Exam Vitals and nursing note reviewed.  Constitutional:      General: He is not in acute distress.    Appearance: Normal appearance. He is obese.  HENT:     Head: Normocephalic and atraumatic.   Cardiovascular:     Rate and Rhythm: Normal rate and regular rhythm.  Pulmonary:     Effort: Pulmonary effort is normal.     Breath sounds: Normal breath sounds.   Neurological:     Mental Status: He is alert.   Psychiatric:        Mood and Affect: Mood normal.        Behavior: Behavior normal.     Lab Results  Component Value Date   WBC 5.3 03/08/2024   HGB 15.7 03/08/2024   HCT 45.2 03/08/2024   PLT 221 03/08/2024   GLUCOSE 79 03/08/2024   CHOL 178 03/31/2021   TRIG 144 03/31/2021   HDL 34 (L) 03/31/2021   LDLCALC 118 (H) 03/31/2021   ALT 53 (H) 03/08/2024   AST 32 03/08/2024   NA 141 03/08/2024   K 4.1 03/08/2024   CL 106 03/08/2024   CREATININE 1.19 03/08/2024   BUN 17 03/08/2024   CO2  26 03/08/2024     Assessment & Plan:  Gastroesophageal reflux disease without esophagitis Assessment & Plan: Stable.  Continue Protonix .   Essential hypertension Assessment & Plan: Fair control.  Continue amlodipine .  Refilled.  Orders: -     amLODIPine  Besylate; Take 1 tablet (5 mg total) by mouth daily.  Dispense: 90 tablet; Refill: 3  NASH (nonalcoholic steatohepatitis) Assessment & Plan: Being followed closely by GI.  Needs weight loss.     Follow-up: 6 months  Khayri Kargbo Debrah Fan DO [facility]

## 2024-05-25 NOTE — Assessment & Plan Note (Signed)
 Being followed closely by GI.  Needs weight loss.

## 2024-05-25 NOTE — Assessment & Plan Note (Signed)
 Fair control.  Continue amlodipine .  Refilled.

## 2024-05-25 NOTE — Assessment & Plan Note (Signed)
 Stable ?Continue Protonix ?

## 2024-06-06 DIAGNOSIS — M25522 Pain in left elbow: Secondary | ICD-10-CM | POA: Diagnosis not present

## 2024-07-05 DIAGNOSIS — M7712 Lateral epicondylitis, left elbow: Secondary | ICD-10-CM | POA: Diagnosis not present

## 2024-07-25 ENCOUNTER — Other Ambulatory Visit: Payer: Self-pay | Admitting: Family Medicine

## 2024-07-25 DIAGNOSIS — K219 Gastro-esophageal reflux disease without esophagitis: Secondary | ICD-10-CM

## 2024-07-26 ENCOUNTER — Ambulatory Visit (HOSPITAL_COMMUNITY)
Admission: RE | Admit: 2024-07-26 | Discharge: 2024-07-26 | Disposition: A | Discharge status: Home / Self Care | Payer: BC Managed Care – PPO | Source: Ambulatory Visit | Attending: Urology | Admitting: Urology

## 2024-07-26 DIAGNOSIS — N281 Cyst of kidney, acquired: Secondary | ICD-10-CM | POA: Diagnosis not present

## 2024-07-26 DIAGNOSIS — N200 Calculus of kidney: Secondary | ICD-10-CM | POA: Insufficient documentation

## 2024-08-01 DIAGNOSIS — M546 Pain in thoracic spine: Secondary | ICD-10-CM | POA: Diagnosis not present

## 2024-08-01 DIAGNOSIS — M62830 Muscle spasm of back: Secondary | ICD-10-CM | POA: Diagnosis not present

## 2024-08-01 DIAGNOSIS — M9902 Segmental and somatic dysfunction of thoracic region: Secondary | ICD-10-CM | POA: Diagnosis not present

## 2024-08-01 DIAGNOSIS — M9903 Segmental and somatic dysfunction of lumbar region: Secondary | ICD-10-CM | POA: Diagnosis not present

## 2024-08-03 ENCOUNTER — Encounter: Payer: Self-pay | Admitting: Urology

## 2024-08-03 ENCOUNTER — Ambulatory Visit (INDEPENDENT_AMBULATORY_CARE_PROVIDER_SITE_OTHER): Payer: BC Managed Care – PPO | Admitting: Urology

## 2024-08-03 ENCOUNTER — Ambulatory Visit (HOSPITAL_COMMUNITY)
Admission: RE | Admit: 2024-08-03 | Discharge: 2024-08-03 | Disposition: A | Discharge status: Home / Self Care | Source: Ambulatory Visit | Attending: Urology | Admitting: Urology

## 2024-08-03 VITALS — BP 132/79 | HR 69

## 2024-08-03 DIAGNOSIS — R109 Unspecified abdominal pain: Secondary | ICD-10-CM | POA: Diagnosis not present

## 2024-08-03 DIAGNOSIS — M549 Dorsalgia, unspecified: Secondary | ICD-10-CM | POA: Diagnosis not present

## 2024-08-03 DIAGNOSIS — N200 Calculus of kidney: Secondary | ICD-10-CM | POA: Diagnosis not present

## 2024-08-03 LAB — URINALYSIS, ROUTINE W REFLEX MICROSCOPIC
Bilirubin, UA: NEGATIVE
Glucose, UA: NEGATIVE
Ketones, UA: NEGATIVE
Leukocytes,UA: NEGATIVE
Nitrite, UA: NEGATIVE
Protein,UA: NEGATIVE
RBC, UA: NEGATIVE
Specific Gravity, UA: 1.03 (ref 1.005–1.030)
Urobilinogen, Ur: 0.2 mg/dL (ref 0.2–1.0)
pH, UA: 6 (ref 5.0–7.5)

## 2024-08-03 MED ORDER — OXYCODONE-ACETAMINOPHEN 5-325 MG PO TABS: 1.0000 | ORAL_TABLET | ORAL | 0 refills | Status: DC | PRN

## 2024-08-03 MED ORDER — TAMSULOSIN HCL 0.4 MG PO CAPS: 0.4000 mg | ORAL_CAPSULE | Freq: Every day | ORAL | 3 refills | Status: DC

## 2024-08-03 MED ORDER — ONDANSETRON HCL 4 MG PO TABS: 4.0000 mg | ORAL_TABLET | Freq: Three times a day (TID) | ORAL | 0 refills | Status: DC | PRN

## 2024-08-03 NOTE — Progress Notes (Signed)
 08/03/2024 12:46 PM   Eva LELON Baller 12/12/1986 [id number]  Referring provider: Cook, Jayce G, DO [address] [place],  [place] [address]  Followup nephrolithiasis   HPI: Mr Jurgens is a 37yo here for followup for nephrolithiasis. He is having intermittent right flank pain for 1 week. Renal US  8/14 shows a 4mm right lower pole calculus. He has intermittent, sharp, mild to moderate right flank pain. [name] shows right lower pole calculus. He denies any worsening LUTS   PMH: Past Medical History:  Diagnosis Date   Acid reflux    Arthritis    Complication of anesthesia    hard to wake up after anesthesia   Elevated LFTs    mildly elevated transaminases in past, now normalized    GERD (gastroesophageal reflux disease)    Gout    History of kidney stones    Hypertension     Surgical History: Past Surgical History:  Procedure Laterality Date   APPENDECTOMY  01/27/2015   BIOPSY  12/10/2019   Procedure: BIOPSY;  Surgeon: Golda Claudis PENNER, MD;  [facility];  Service: Endoscopy;;  esophagus gastric   CHOLECYSTECTOMY     COLONOSCOPY WITH ESOPHAGOGASTRODUODENOSCOPY (EGD) N/A 02/25/2014   NL TI, 2 SIMPLE ADENOMAS(1:>1 CM), LGE IH-FG POLYPS, PROMINENT AMPULLA. Needs Colonoscopy surveillance in 2018   COLONOSCOPY WITH PROPOFOL  N/A 09/11/2021   Procedure: COLONOSCOPY WITH PROPOFOL ;  Surgeon: Eartha Angelia Sieving, MD;  [facility];  Service: Gastroenterology;  Laterality: N/A;  10:35   ESOPHAGEAL DILATION N/A 08/30/2018   Procedure: ESOPHAGEAL DILATION;  Surgeon: Golda Claudis PENNER, MD;  [facility];  Service: Endoscopy;  Laterality: N/A;   ESOPHAGEAL DILATION N/A 12/10/2019   Procedure: ESOPHAGEAL DILATION;  Surgeon: Golda Claudis PENNER, MD;  [facility];  Service: Endoscopy;  Laterality: N/A;   ESOPHAGOGASTRODUODENOSCOPY N/A 04/29/2015   Dr. Harvey: 1. mild non-erosive gastritis (inflammation) was found in the gastric antrum. 2.  Prominent Ampullla 6mm. benign path with pyloric metaplasia   ESOPHAGOGASTRODUODENOSCOPY N/A 07/12/2016   Procedure: ESOPHAGOGASTRODUODENOSCOPY (EGD);  Surgeon: Margo LITTIE Harvey, MD;  [facility];  Service: Endoscopy;  Laterality: N/A;  830   ESOPHAGOGASTRODUODENOSCOPY N/A 08/30/2018   Procedure: ESOPHAGOGASTRODUODENOSCOPY (EGD);  Surgeon: Golda Claudis PENNER, MD;  [facility];  Service: Endoscopy;  Laterality: N/A;  2:00   ESOPHAGOGASTRODUODENOSCOPY (EGD) WITH PROPOFOL  N/A 12/10/2019   Procedure: ESOPHAGOGASTRODUODENOSCOPY (EGD) WITH PROPOFOL ;  Surgeon: Golda Claudis PENNER, MD;  [facility];  Service: Endoscopy;  Laterality: N/A;  12:55   EUS N/A 03/20/2014   Dr. Burnette: prominent major papilla s/p biopsy, minor papilla without clear adenomatous or mass-like appearance, chronic duodenitis on path.    EXTRACORPOREAL SHOCK WAVE LITHOTRIPSY Right 07/12/2023   Procedure: EXTRACORPOREAL SHOCK WAVE LITHOTRIPSY (ESWL);  Surgeon: Sherrilee Belvie LITTIE, MD;  Location: [facility];  Service: Urology;  Laterality: Right;  Janith Nielson has a surgery before this case.  Office to tell pt to arrive at 10:30 and NPO after midnight.   INCISIONAL HERNIA REPAIR N/A 05/02/2019   Procedure: HERNIA REPAIR OPEN INCISIONAL WITH MESH;  Surgeon: Kallie Manuelita BROCKS, MD;  Location: [facility];  Service: General;  Laterality: N/A;   KIDNEY STONE SURGERY  age 61 and age 27   lithotripsy, stent   POLYPECTOMY  12/10/2019   Procedure: POLYPECTOMY;  Surgeon: Golda Claudis PENNER, MD;  [facility];  Service: Endoscopy;;  gastric   POLYPECTOMY  09/11/2021   Procedure: POLYPECTOMY INTESTINAL;  Surgeon: Eartha Flavors, Toribio, MD;  [facility];  Service: Gastroenterology;;   HARLEY DILATION N/A 07/12/2016   Procedure: HARLEY DILATION;  Surgeon: Margo LITTIE Haddock, MD;  [facility];  Service: Endoscopy;  Laterality: N/A;   VENTRAL HERNIA REPAIR N/A 11/28/2019   Procedure: LAPAROSCOPIC  VENTRAL HERNIA WITH MESH;  Surgeon: Kallie Manuelita BROCKS, MD;  Location: [facility];  Service: General;  Laterality: N/A;   WISDOM TOOTH EXTRACTION      Home Medications:  Allergies as of 08/03/2024       Reactions   Phenergan  [promethazine  Hcl] Swelling   arn swelled and hurt for 3 days, pt would rather not have        Medication List        Accurate as of August 03, 2024 12:46 PM. If you have any questions, ask your nurse or doctor.          allopurinol  300 MG tablet Commonly known as: ZYLOPRIM  Take 1 tablet (300 mg total) by mouth at bedtime.   amLODipine  5 MG tablet Commonly known as: NORVASC  Take 1 tablet (5 mg total) by mouth daily.   hydrocortisone  25 MG suppository Commonly known as: ANUSOL -HC Place 1 suppository (25 mg total) rectally 2 (two) times daily.   pantoprazole  40 MG tablet Commonly known as: PROTONIX  Take 1 tablet by mouth once daily   Potassium Citrate  15 MEQ (1620 MG) Tbcr Commonly known as: Urocit-K  15 Take 1 tablet by mouth in the morning and at bedtime.        Allergies:  Allergies  Allergen Reactions   Phenergan  [Promethazine  Hcl] Swelling    arn swelled and hurt for 3 days, pt would rather not have    Family History: Family History  Problem Relation Age of Onset   Diabetes Father    COPD Father    Colon cancer Neg Hx        does not know paternal side    Social History:  reports that he has never smoked. He has never used smokeless tobacco. He reports that he does not drink alcohol and does not use drugs.  ROS: All other review of systems were reviewed and are negative except what is noted above in HPI  Physical Exam: BP 132/79   Pulse 69   Constitutional:  Alert and oriented, No acute distress. HEENT: [place] AT, moist mucus membranes.  Trachea midline, no masses. Cardiovascular: No clubbing, cyanosis, or edema. Respiratory: Normal respiratory effort, no increased work of breathing. GI: Abdomen is soft, nontender,  nondistended, no abdominal masses GU: No CVA tenderness.  Lymph: No cervical or inguinal lymphadenopathy. Skin: No rashes, bruises or suspicious lesions. Neurologic: Grossly intact, no focal deficits, moving all 4 extremities. Psychiatric: Normal mood and affect.  Laboratory Data: Lab Results  Component Value Date   WBC 5.3 03/08/2024   HGB 15.7 03/08/2024   HCT 45.2 03/08/2024   MCV 88.8 03/08/2024   PLT 221 03/08/2024    Lab Results  Component Value Date   CREATININE 1.19 03/08/2024    No results found for: PSA  No results found for: TESTOSTERONE  No results found for: HGBA1C  Urinalysis    Component Value Date/Time   COLORURINE YELLOW 07/09/2023 0917   APPEARANCEUR Clear 02/03/2024 1212   LABSPEC 1.017 07/09/2023 0917   PHURINE 6.0 07/09/2023 0917   GLUCOSEU Negative 02/03/2024 1212   HGBUR SMALL (A) 07/09/2023 0917   BILIRUBINUR Negative 02/03/2024 1212   KETONESUR NEGATIVE 07/09/2023 0917  PROTEINUR Negative 02/03/2024 1212   PROTEINUR NEGATIVE 07/09/2023 0917   UROBILINOGEN negative (A) 01/30/2020 1421   UROBILINOGEN 0.2 02/24/2009 0912   NITRITE Negative 02/03/2024 1212   NITRITE NEGATIVE 07/09/2023 0917   LEUKOCYTESUR Negative 02/03/2024 1212   LEUKOCYTESUR NEGATIVE 07/09/2023 0917    Lab Results  Component Value Date   LABMICR Comment 02/03/2024   WBCUA 0-5 08/02/2023   LABEPIT 0-10 08/02/2023   BACTERIA None seen 08/02/2023    Pertinent Imaging: [name] today: Images reviewed and discussed with the patient  Results for orders placed during the hospital encounter of 08/02/23  DG Abd 1 View  Narrative CLINICAL DATA:  Follow up kidney stone  EXAM: ABDOMEN - 1 VIEW  COMPARISON:  07/12/2023.  FINDINGS: The bowel gas pattern is normal. No radio-opaque calculi or other significant radiographic abnormality are seen. Mesh consistent with previous abdominal wall repair. Right upper quadrant cholecystectomy clips.  IMPRESSION: Barely  perceptible punctate calcifications are present consistent with nephrolithiasis which could be confirmed with noncontrast CT.   Electronically Signed By: Fonda Field M.D. On: 08/05/2023 22:17  No results found for this or any previous visit.  No results found for this or any previous visit.  No results found for this or any previous visit.  Results for orders placed during the hospital encounter of 02/02/24  US  RENAL  Narrative : PROCEDURE: US  RENAL  HISTORY: Patient is a 38 y/o  M with follow-up renal stones.  COMPARISON: U/S renal 03/17/2023, 08/30/2022.  TECHNIQUE: Two-dimensional grayscale and color Doppler ultrasound of the kidneys was performed.  FINDINGS: The urinary bladder demonstrates normal anechoic echogenicity. The right ureteral jet is visualized.  The right kidney measures 12.7 x 6.0 x 5.8 cm. Renal cortical echotexture is within normal limits. There is no hydronephrosis. There are no stones. There are no cysts.  The left kidney measures 13.2 x 5.2 x 5.1 cm. Renal cortical echotexture is within normal limits. There is no hydronephrosis. There are no stones. There is a simple cyst measuring 1.5 cm arising from the inferior pole.  Fatty liver is incidentally noted.  IMPRESSION: 1. Simple left renal cyst, otherwise unremarkable ultrasound of the kidneys.  2.  The left ureteral jet is not visualized.  3.  Diffuse hepatic steatosis.  Thank you for allowing us  to assist in the care of this patient.   Electronically Signed By: Lynwood Mains M.D. On: 02/02/2024 21:13  No results found for this or any previous visit.  No results found for this or any previous visit.  No results found for this or any previous visit.   Assessment & Plan:    1. Nephrolithiasis (Primary) -We discussed the management of kidney stones. These options include observation, ureteroscopy, shockwave lithotripsy (ESWL) and percutaneous nephrolithotomy (PCNL). We  discussed which options are relevant to the patient's stone(s). We discussed the natural history of kidney stones as well as the complications of untreated stones and the impact on quality of life without treatment as well as with each of the above listed treatments. We also discussed the efficacy of each treatment in its ability to clear the stone burden. With any of these management options I discussed the signs and symptoms of infection and the need for emergent treatment should these be experienced. For each option we discussed the ability of each procedure to clear the patient of their stone burden.   For observation I described the risks which include but are not limited to silent renal damage, life-threatening infection, need for  emergent surgery, failure to pass stone and pain.   For ureteroscopy I described the risks which include bleeding, infection, damage to contiguous structures, positioning injury, ureteral stricture, ureteral avulsion, ureteral injury, need for prolonged ureteral stent, inability to perform ureteroscopy, need for an interval procedure, inability to clear stone burden, stent discomfort/pain, heart attack, stroke, pulmonary embolus and the inherent risks with general anesthesia.   For shockwave lithotripsy I described the risks which include arrhythmia, kidney contusion, kidney hemorrhage, need for transfusion, pain, inability to adequately break up stone, inability to pass stone fragments, Steinstrasse, infection associated with obstructing stones, need for alternate surgical procedure, need for repeat shockwave lithotripsy, MI, CVA, PE and the inherent risks with anesthesia/conscious sedation.   For PCNL I described the risks including positioning injury, pneumothorax, hydrothorax, need for chest tube, inability to clear stone burden, renal laceration, arterial venous fistula or malformation, need for embolization of kidney, loss of kidney or renal function, need for repeat  procedure, need for prolonged nephrostomy tube, ureteral avulsion, MI, CVA, PE and the inherent risks of general anesthesia.   - The patient would like to proceed with medical expulsive therapy. He will call next week if he desires to proceed with ESWL - Urinalysis, Routine w reflex microscopic   No follow-ups on file.  [name], MD  [facility] [place]

## 2024-08-03 NOTE — Patient Instructions (Signed)

## 2024-08-03 NOTE — H&P (View-Only) (Signed)
 08/03/2024 12:46 PM   Eva LELON Baller 12/12/1986 [id number]  Referring provider: Cook, Jayce G, DO [address] [place],  [place] [address]  Followup nephrolithiasis   HPI: Mr [name] is a 37yo here for followup for nephrolithiasis. He is having intermittent right flank pain for 1 week. Renal US  8/14 shows a 4mm right lower pole calculus. He has intermittent, sharp, mild to moderate right flank pain. [name] shows right lower pole calculus. He denies any worsening LUTS   PMH: Past Medical History:  Diagnosis Date   Acid reflux    Arthritis    Complication of anesthesia    hard to wake up after anesthesia   Elevated LFTs    mildly elevated transaminases in past, now normalized    GERD (gastroesophageal reflux disease)    Gout    History of kidney stones    Hypertension     Surgical History: Past Surgical History:  Procedure Laterality Date   APPENDECTOMY  01/27/2015   BIOPSY  12/10/2019   Procedure: BIOPSY;  Surgeon: Golda Claudis PENNER, MD;  [facility];  Service: Endoscopy;;  esophagus gastric   CHOLECYSTECTOMY     COLONOSCOPY WITH ESOPHAGOGASTRODUODENOSCOPY (EGD) N/A 02/25/2014   NL TI, 2 SIMPLE ADENOMAS(1:>1 CM), LGE IH-FG POLYPS, PROMINENT AMPULLA. Needs Colonoscopy surveillance in 2018   COLONOSCOPY WITH PROPOFOL  N/A 09/11/2021   Procedure: COLONOSCOPY WITH PROPOFOL ;  Surgeon: Eartha Angelia Sieving, MD;  [facility];  Service: Gastroenterology;  Laterality: N/A;  10:35   ESOPHAGEAL DILATION N/A 08/30/2018   Procedure: ESOPHAGEAL DILATION;  Surgeon: Golda Claudis PENNER, MD;  [facility];  Service: Endoscopy;  Laterality: N/A;   ESOPHAGEAL DILATION N/A 12/10/2019   Procedure: ESOPHAGEAL DILATION;  Surgeon: Golda Claudis PENNER, MD;  [facility];  Service: Endoscopy;  Laterality: N/A;   ESOPHAGOGASTRODUODENOSCOPY N/A 04/29/2015   Dr. Harvey: 1. mild non-erosive gastritis (inflammation) was found in the gastric antrum. 2.  Prominent Ampullla 6mm. benign path with pyloric metaplasia   ESOPHAGOGASTRODUODENOSCOPY N/A 07/12/2016   Procedure: ESOPHAGOGASTRODUODENOSCOPY (EGD);  Surgeon: Margo LITTIE Harvey, MD;  [facility];  Service: Endoscopy;  Laterality: N/A;  830   ESOPHAGOGASTRODUODENOSCOPY N/A 08/30/2018   Procedure: ESOPHAGOGASTRODUODENOSCOPY (EGD);  Surgeon: Golda Claudis PENNER, MD;  [facility];  Service: Endoscopy;  Laterality: N/A;  2:00   ESOPHAGOGASTRODUODENOSCOPY (EGD) WITH PROPOFOL  N/A 12/10/2019   Procedure: ESOPHAGOGASTRODUODENOSCOPY (EGD) WITH PROPOFOL ;  Surgeon: Golda Claudis PENNER, MD;  [facility];  Service: Endoscopy;  Laterality: N/A;  12:55   EUS N/A 03/20/2014   Dr. Burnette: prominent major papilla s/p biopsy, minor papilla without clear adenomatous or mass-like appearance, chronic duodenitis on path.    EXTRACORPOREAL SHOCK WAVE LITHOTRIPSY Right 07/12/2023   Procedure: EXTRACORPOREAL SHOCK WAVE LITHOTRIPSY (ESWL);  Surgeon: Sherrilee Belvie LITTIE, MD;  [facility];  Service: Urology;  Laterality: Right;  Janith Nielson has a surgery before this case.  Office to tell pt to arrive at 10:30 and NPO after midnight.   INCISIONAL HERNIA REPAIR N/A 05/02/2019   Procedure: HERNIA REPAIR OPEN INCISIONAL WITH MESH;  Surgeon: Kallie Manuelita BROCKS, MD;  [facility];  Service: General;  Laterality: N/A;   KIDNEY STONE SURGERY  age 38 and age 38   lithotripsy, stent   POLYPECTOMY  12/10/2019   Procedure: POLYPECTOMY;  Surgeon: Golda Claudis PENNER, MD;  [facility];  Service: Endoscopy;;  gastric   POLYPECTOMY  09/11/2021   Procedure: POLYPECTOMY INTESTINAL;  Surgeon: Eartha Flavors, Toribio, MD;  [facility];  Service: Gastroenterology;;   HARLEY DILATION N/A 07/12/2016   Procedure: HARLEY DILATION;  Surgeon: Margo LITTIE Haddock, MD;  [facility];  Service: Endoscopy;  Laterality: N/A;   VENTRAL HERNIA REPAIR N/A 11/28/2019   Procedure: LAPAROSCOPIC  VENTRAL HERNIA WITH MESH;  Surgeon: Kallie Manuelita BROCKS, MD;  Location: [facility];  Service: General;  Laterality: N/A;   WISDOM TOOTH EXTRACTION      Home Medications:  Allergies as of 08/03/2024       Reactions   Phenergan  [promethazine  Hcl] Swelling   arn swelled and hurt for 3 days, pt would rather not have        Medication List        Accurate as of August 03, 2024 12:46 PM. If you have any questions, ask your nurse or doctor.          allopurinol  300 MG tablet Commonly known as: ZYLOPRIM  Take 1 tablet (300 mg total) by mouth at bedtime.   amLODipine  5 MG tablet Commonly known as: NORVASC  Take 1 tablet (5 mg total) by mouth daily.   hydrocortisone  25 MG suppository Commonly known as: ANUSOL -HC Place 1 suppository (25 mg total) rectally 2 (two) times daily.   pantoprazole  40 MG tablet Commonly known as: PROTONIX  Take 1 tablet by mouth once daily   Potassium Citrate  15 MEQ (1620 MG) Tbcr Commonly known as: Urocit-K  15 Take 1 tablet by mouth in the morning and at bedtime.        Allergies:  Allergies  Allergen Reactions   Phenergan  [Promethazine  Hcl] Swelling    arn swelled and hurt for 3 days, pt would rather not have    Family History: Family History  Problem Relation Age of Onset   Diabetes Father    COPD Father    Colon cancer Neg Hx        does not know paternal side    Social History:  reports that he has never smoked. He has never used smokeless tobacco. He reports that he does not drink alcohol and does not use drugs.  ROS: All other review of systems were reviewed and are negative except what is noted above in HPI  Physical Exam: BP 132/79   Pulse 69   Constitutional:  Alert and oriented, No acute distress. HEENT: [place] AT, moist mucus membranes.  Trachea midline, no masses. Cardiovascular: No clubbing, cyanosis, or edema. Respiratory: Normal respiratory effort, no increased work of breathing. GI: Abdomen is soft, nontender,  nondistended, no abdominal masses GU: No CVA tenderness.  Lymph: No cervical or inguinal lymphadenopathy. Skin: No rashes, bruises or suspicious lesions. Neurologic: Grossly intact, no focal deficits, moving all 4 extremities. Psychiatric: Normal mood and affect.  Laboratory Data: Lab Results  Component Value Date   WBC 5.3 03/08/2024   HGB 15.7 03/08/2024   HCT 45.2 03/08/2024   MCV 88.8 03/08/2024   PLT 221 03/08/2024    Lab Results  Component Value Date   CREATININE 1.19 03/08/2024    No results found for: PSA  No results found for: TESTOSTERONE  No results found for: HGBA1C  Urinalysis    Component Value Date/Time   COLORURINE YELLOW 07/09/2023 0917   APPEARANCEUR Clear 02/03/2024 1212   LABSPEC 1.017 07/09/2023 0917   PHURINE 6.0 07/09/2023 0917   GLUCOSEU Negative 02/03/2024 1212   HGBUR SMALL (A) 07/09/2023 0917   BILIRUBINUR Negative 02/03/2024 1212   KETONESUR NEGATIVE 07/09/2023 0917  PROTEINUR Negative 02/03/2024 1212   PROTEINUR NEGATIVE 07/09/2023 0917   UROBILINOGEN negative (A) 01/30/2020 1421   UROBILINOGEN 0.2 02/24/2009 0912   NITRITE Negative 02/03/2024 1212   NITRITE NEGATIVE 07/09/2023 0917   LEUKOCYTESUR Negative 02/03/2024 1212   LEUKOCYTESUR NEGATIVE 07/09/2023 0917    Lab Results  Component Value Date   LABMICR Comment 02/03/2024   WBCUA 0-5 08/02/2023   LABEPIT 0-10 08/02/2023   BACTERIA None seen 08/02/2023    Pertinent Imaging: [name] today: Images reviewed and discussed with the patient  Results for orders placed during the hospital encounter of 08/02/23  DG Abd 1 View  Narrative CLINICAL DATA:  Follow up kidney stone  EXAM: ABDOMEN - 1 VIEW  COMPARISON:  07/12/2023.  FINDINGS: The bowel gas pattern is normal. No radio-opaque calculi or other significant radiographic abnormality are seen. Mesh consistent with previous abdominal wall repair. Right upper quadrant cholecystectomy clips.  IMPRESSION: Barely  perceptible punctate calcifications are present consistent with nephrolithiasis which could be confirmed with noncontrast CT.   Electronically Signed By: Fonda Field M.D. On: 08/05/2023 22:17  No results found for this or any previous visit.  No results found for this or any previous visit.  No results found for this or any previous visit.  Results for orders placed during the hospital encounter of 02/02/24  US  RENAL  Narrative : PROCEDURE: US  RENAL  HISTORY: Patient is a 38 y/o  M with follow-up renal stones.  COMPARISON: U/S renal 03/17/2023, 08/30/2022.  TECHNIQUE: Two-dimensional grayscale and color Doppler ultrasound of the kidneys was performed.  FINDINGS: The urinary bladder demonstrates normal anechoic echogenicity. The right ureteral jet is visualized.  The right kidney measures 12.7 x 6.0 x 5.8 cm. Renal cortical echotexture is within normal limits. There is no hydronephrosis. There are no stones. There are no cysts.  The left kidney measures 13.2 x 5.2 x 5.1 cm. Renal cortical echotexture is within normal limits. There is no hydronephrosis. There are no stones. There is a simple cyst measuring 1.5 cm arising from the inferior pole.  Fatty liver is incidentally noted.  IMPRESSION: 1. Simple left renal cyst, otherwise unremarkable ultrasound of the kidneys.  2.  The left ureteral jet is not visualized.  3.  Diffuse hepatic steatosis.  Thank you for allowing us  to assist in the care of this patient.   Electronically Signed By: Lynwood Mains M.D. On: 02/02/2024 21:13  No results found for this or any previous visit.  No results found for this or any previous visit.  No results found for this or any previous visit.   Assessment & Plan:    1. Nephrolithiasis (Primary) -We discussed the management of kidney stones. These options include observation, ureteroscopy, shockwave lithotripsy (ESWL) and percutaneous nephrolithotomy (PCNL). We  discussed which options are relevant to the patient's stone(s). We discussed the natural history of kidney stones as well as the complications of untreated stones and the impact on quality of life without treatment as well as with each of the above listed treatments. We also discussed the efficacy of each treatment in its ability to clear the stone burden. With any of these management options I discussed the signs and symptoms of infection and the need for emergent treatment should these be experienced. For each option we discussed the ability of each procedure to clear the patient of their stone burden.   For observation I described the risks which include but are not limited to silent renal damage, life-threatening infection, need for  emergent surgery, failure to pass stone and pain.   For ureteroscopy I described the risks which include bleeding, infection, damage to contiguous structures, positioning injury, ureteral stricture, ureteral avulsion, ureteral injury, need for prolonged ureteral stent, inability to perform ureteroscopy, need for an interval procedure, inability to clear stone burden, stent discomfort/pain, heart attack, stroke, pulmonary embolus and the inherent risks with general anesthesia.   For shockwave lithotripsy I described the risks which include arrhythmia, kidney contusion, kidney hemorrhage, need for transfusion, pain, inability to adequately break up stone, inability to pass stone fragments, Steinstrasse, infection associated with obstructing stones, need for alternate surgical procedure, need for repeat shockwave lithotripsy, MI, CVA, PE and the inherent risks with anesthesia/conscious sedation.   For PCNL I described the risks including positioning injury, pneumothorax, hydrothorax, need for chest tube, inability to clear stone burden, renal laceration, arterial venous fistula or malformation, need for embolization of kidney, loss of kidney or renal function, need for repeat  procedure, need for prolonged nephrostomy tube, ureteral avulsion, MI, CVA, PE and the inherent risks of general anesthesia.   - The patient would like to proceed with medical expulsive therapy. He will call next week if he desires to proceed with ESWL - Urinalysis, Routine w reflex microscopic   No follow-ups on file.  [name], MD  [facility] [place]

## 2024-08-08 ENCOUNTER — Other Ambulatory Visit: Payer: Self-pay

## 2024-08-08 DIAGNOSIS — N200 Calculus of kidney: Secondary | ICD-10-CM

## 2024-08-09 ENCOUNTER — Encounter (HOSPITAL_COMMUNITY)
Admission: RE | Admit: 2024-08-09 | Discharge: 2024-08-09 | Disposition: A | Discharge status: Home / Self Care | Source: Ambulatory Visit | Attending: Urology | Admitting: Urology

## 2024-08-09 ENCOUNTER — Encounter (HOSPITAL_COMMUNITY): Payer: Self-pay

## 2024-08-09 ENCOUNTER — Other Ambulatory Visit: Payer: Self-pay

## 2024-08-14 ENCOUNTER — Ambulatory Visit (HOSPITAL_COMMUNITY)

## 2024-08-14 ENCOUNTER — Encounter (HOSPITAL_COMMUNITY): Payer: Self-pay | Admitting: Urology

## 2024-08-14 ENCOUNTER — Encounter (HOSPITAL_COMMUNITY)
Admission: RE | Disposition: A | Discharge status: Home / Self Care | Payer: Self-pay | Source: Home / Self Care | Attending: Urology

## 2024-08-14 ENCOUNTER — Ambulatory Visit (HOSPITAL_COMMUNITY)
Admission: RE | Admit: 2024-08-14 | Discharge: 2024-08-14 | Disposition: A | Discharge status: Home / Self Care | Attending: Urology | Admitting: Urology

## 2024-08-14 DIAGNOSIS — N200 Calculus of kidney: Secondary | ICD-10-CM | POA: Diagnosis not present

## 2024-08-14 DIAGNOSIS — Z87442 Personal history of urinary calculi: Secondary | ICD-10-CM | POA: Insufficient documentation

## 2024-08-14 DIAGNOSIS — I10 Essential (primary) hypertension: Secondary | ICD-10-CM | POA: Insufficient documentation

## 2024-08-14 HISTORY — PX: EXTRACORPOREAL SHOCK WAVE LITHOTRIPSY: SHX1557

## 2024-08-14 SURGERY — LITHOTRIPSY, ESWL
Anesthesia: LOCAL | Laterality: Right

## 2024-08-14 MED ORDER — OXYCODONE-ACETAMINOPHEN 5-325 MG PO TABS: 1.0000 | ORAL_TABLET | ORAL | 0 refills | Status: DC | PRN

## 2024-08-14 MED ORDER — DIAZEPAM 5 MG PO TABS
ORAL_TABLET | ORAL | Status: AC
  Filled 2024-08-14: qty 2

## 2024-08-14 MED ORDER — DIAZEPAM 5 MG PO TABS
10.0000 mg | ORAL_TABLET | Freq: Once | ORAL | Status: AC
  Administered 2024-08-14: 10 mg via ORAL

## 2024-08-14 MED ORDER — ONDANSETRON HCL 4 MG PO TABS: 4.0000 mg | ORAL_TABLET | Freq: Three times a day (TID) | ORAL | 0 refills | Status: DC | PRN

## 2024-08-14 MED ORDER — TAMSULOSIN HCL 0.4 MG PO CAPS
0.4000 mg | ORAL_CAPSULE | Freq: Every day | ORAL | 3 refills | Status: AC
Start: 2024-08-14 — End: ?

## 2024-08-14 MED ORDER — SODIUM CHLORIDE 0.9 % IV SOLN: INTRAVENOUS | Status: DC

## 2024-08-14 MED ORDER — DIPHENHYDRAMINE HCL 25 MG PO CAPS
25.0000 mg | ORAL_CAPSULE | ORAL | Status: AC
  Administered 2024-08-14: 25 mg via ORAL

## 2024-08-14 MED ORDER — DIPHENHYDRAMINE HCL 25 MG PO CAPS
ORAL_CAPSULE | ORAL | Status: AC
  Filled 2024-08-14: qty 1

## 2024-08-14 NOTE — Interval H&P Note (Signed)
 History and Physical Interval Note:  08/14/2024 8:08 AM  [name]  has presented today for surgery, with the diagnosis of right nephrolithiasis.  The various methods of treatment have been discussed with the patient and family. After consideration of risks, benefits and other options for treatment, the patient has consented to  Procedure(s): LITHOTRIPSY, ESWL (Right) as a surgical intervention.  The patient's history has been reviewed, patient examined, no change in status, stable for surgery.  I have reviewed the patient's chart and labs.  Questions were answered to the patient's satisfaction.     [name]

## 2024-08-14 NOTE — Progress Notes (Signed)
 Strawberry colored area over right flank.

## 2024-08-15 ENCOUNTER — Encounter (HOSPITAL_COMMUNITY): Payer: Self-pay | Admitting: Urology

## 2024-08-24 ENCOUNTER — Encounter (INDEPENDENT_AMBULATORY_CARE_PROVIDER_SITE_OTHER): Payer: Self-pay | Admitting: *Deleted

## 2024-09-04 ENCOUNTER — Other Ambulatory Visit: Payer: Self-pay

## 2024-09-04 DIAGNOSIS — N200 Calculus of kidney: Secondary | ICD-10-CM

## 2024-09-05 ENCOUNTER — Ambulatory Visit (INDEPENDENT_AMBULATORY_CARE_PROVIDER_SITE_OTHER): Admitting: Urology

## 2024-09-05 ENCOUNTER — Encounter: Payer: Self-pay | Admitting: Urology

## 2024-09-05 ENCOUNTER — Ambulatory Visit (HOSPITAL_COMMUNITY)
Admission: RE | Admit: 2024-09-05 | Discharge: 2024-09-05 | Disposition: A | Discharge status: Home / Self Care | Source: Ambulatory Visit | Attending: Urology | Admitting: Urology

## 2024-09-05 VITALS — BP 119/77 | HR 88

## 2024-09-05 DIAGNOSIS — N200 Calculus of kidney: Secondary | ICD-10-CM | POA: Insufficient documentation

## 2024-09-05 DIAGNOSIS — I878 Other specified disorders of veins: Secondary | ICD-10-CM | POA: Diagnosis not present

## 2024-09-05 DIAGNOSIS — Z9049 Acquired absence of other specified parts of digestive tract: Secondary | ICD-10-CM | POA: Diagnosis not present

## 2024-09-05 LAB — MICROSCOPIC EXAMINATION: Bacteria, UA: NONE SEEN

## 2024-09-05 LAB — URINALYSIS, ROUTINE W REFLEX MICROSCOPIC
Bilirubin, UA: NEGATIVE
Glucose, UA: NEGATIVE
Ketones, UA: NEGATIVE
Nitrite, UA: NEGATIVE
Protein,UA: NEGATIVE
RBC, UA: NEGATIVE
Specific Gravity, UA: 1.02 (ref 1.005–1.030)
Urobilinogen, Ur: 1 mg/dL (ref 0.2–1.0)
pH, UA: 6 (ref 5.0–7.5)

## 2024-09-05 NOTE — Progress Notes (Signed)
 09/05/2024 1:35 PM   [name] 21-Jan-1986 [id number]  Referring provider: Cook, Jayce G, Thomas [address] [place],  [place] [address]  No chief complaint on file.   HPI: Mr [name] is a 38yo here for followup for nephrolithiasis. He passed fragments and brought them with him today. [name] shows possible 1-91mm right lower pole calculus. He has intermittent right flank pain and pain under his rib. He denies any LUTs.    PMH: Past Medical History:  Diagnosis Date   Acid reflux    Arthritis    Complication of anesthesia    hard to wake up after anesthesia   Elevated LFTs    mildly elevated transaminases in past, now normalized    GERD (gastroesophageal reflux disease)    Gout    History of kidney stones    Hypertension     Surgical History: Past Surgical History:  Procedure Laterality Date   APPENDECTOMY  01/27/2015   BIOPSY  12/10/2019   Procedure: BIOPSY;  Surgeon: Dustin Claudis PENNER, MD;  [facility];  Service: Endoscopy;;  esophagus gastric   CHOLECYSTECTOMY     COLONOSCOPY WITH ESOPHAGOGASTRODUODENOSCOPY (EGD) N/A 02/25/2014   NL TI, 2 SIMPLE ADENOMAS(1:>1 CM), LGE IH-FG POLYPS, PROMINENT AMPULLA. Needs Colonoscopy surveillance in 2018   COLONOSCOPY WITH PROPOFOL  N/A 09/11/2021   Procedure: COLONOSCOPY WITH PROPOFOL ;  Surgeon: Dustin Angelia Sieving, MD;  [facility];  Service: Gastroenterology;  Laterality: N/A;  10:35   ESOPHAGEAL DILATION N/A 08/30/2018   Procedure: ESOPHAGEAL DILATION;  Surgeon: Dustin Claudis PENNER, MD;  [facility];  Service: Endoscopy;  Laterality: N/A;   ESOPHAGEAL DILATION N/A 12/10/2019   Procedure: ESOPHAGEAL DILATION;  Surgeon: Dustin Claudis PENNER, MD;  [facility];  Service: Endoscopy;  Laterality: N/A;   ESOPHAGOGASTRODUODENOSCOPY N/A 04/29/2015   Dr. Harvey: 1. mild non-erosive gastritis (inflammation) was found in the gastric antrum. 2. Prominent Ampullla 6mm. benign path with  pyloric metaplasia   ESOPHAGOGASTRODUODENOSCOPY N/A 07/12/2016   Procedure: ESOPHAGOGASTRODUODENOSCOPY (EGD);  Surgeon: [name] Harvey, MD;  [facility];  Service: Endoscopy;  Laterality: N/A;  830   ESOPHAGOGASTRODUODENOSCOPY N/A 08/30/2018   Procedure: ESOPHAGOGASTRODUODENOSCOPY (EGD);  Surgeon: Dustin Claudis PENNER, MD;  [facility];  Service: Endoscopy;  Laterality: N/A;  2:00   ESOPHAGOGASTRODUODENOSCOPY (EGD) WITH PROPOFOL  N/A 12/10/2019   Procedure: ESOPHAGOGASTRODUODENOSCOPY (EGD) WITH PROPOFOL ;  Surgeon: Dustin Claudis PENNER, MD;  [facility];  Service: Endoscopy;  Laterality: N/A;  12:55   EUS N/A 03/20/2014   Dr. Burnette: prominent major papilla s/p biopsy, minor papilla without clear adenomatous or mass-like appearance, chronic duodenitis on path.    EXTRACORPOREAL SHOCK WAVE LITHOTRIPSY Right 07/12/2023   Procedure: EXTRACORPOREAL SHOCK WAVE LITHOTRIPSY (ESWL);  Surgeon: Dustin Belvie LITTIE, MD;  [facility];  Service: Urology;  Laterality: Right;  [name] has a surgery before this case.  Office to tell pt to arrive at 10:30 and NPO after midnight.   EXTRACORPOREAL SHOCK WAVE LITHOTRIPSY Right 08/14/2024   Procedure: LITHOTRIPSY, ESWL;  Surgeon: Dustin Belvie LITTIE, MD;  [facility];  Service: Urology;  Laterality: Right;   INCISIONAL HERNIA REPAIR N/A 05/02/2019   Procedure: HERNIA REPAIR OPEN INCISIONAL WITH MESH;  Surgeon: Dustin Manuelita BROCKS, MD;  [facility];  Service: General;  Laterality: N/A;   KIDNEY STONE SURGERY  age 73 and age 20   lithotripsy, stent   POLYPECTOMY  12/10/2019   Procedure: POLYPECTOMY;  Surgeon:  Dustin Claudis PENNER, MD;  [facility];  Service: Endoscopy;;  gastric   POLYPECTOMY  09/11/2021   Procedure: POLYPECTOMY INTESTINAL;  Surgeon: Dustin Angelia Sieving, MD;  [facility];  Service: Gastroenterology;;   HARLEY DILATION N/A 07/12/2016   Procedure: HARLEY DILATION;  Surgeon: [name] Haddock, MD;   [facility];  Service: Endoscopy;  Laterality: N/A;   VENTRAL HERNIA REPAIR N/A 11/28/2019   Procedure: LAPAROSCOPIC VENTRAL HERNIA WITH MESH;  Surgeon: Dustin Manuelita BROCKS, MD;  [facility];  Service: General;  Laterality: N/A;   WISDOM TOOTH EXTRACTION      Home Medications:  Allergies as of 09/05/2024       Reactions   Phenergan  [promethazine  Hcl] Swelling   arn swelled and hurt for 3 days, pt would rather not have        Medication List        Accurate as of September 05, 2024  1:35 PM. If you have any questions, ask your nurse or doctor.          allopurinol  300 MG tablet Commonly known as: ZYLOPRIM  Take 1 tablet (300 mg total) by mouth at bedtime.   amLODipine  5 MG tablet Commonly known as: NORVASC  Take 1 tablet (5 mg total) by mouth daily.   hydrocortisone  25 MG suppository Commonly known as: ANUSOL -HC Place 1 suppository (25 mg total) rectally 2 (two) times daily.   ondansetron  4 MG tablet Commonly known as: Zofran  Take 1 tablet (4 mg total) by mouth every 8 (eight) hours as needed for nausea or vomiting.   oxyCODONE -acetaminophen  5-325 MG tablet Commonly known as: Percocet Take 1 tablet by mouth every 4 (four) hours as needed.   pantoprazole  40 MG tablet Commonly known as: PROTONIX  Take 1 tablet by mouth once daily   Potassium Citrate  15 MEQ (1620 MG) Tbcr Commonly known as: Urocit-K  15 Take 1 tablet by mouth in the morning and at bedtime.   tamsulosin  0.4 MG Caps capsule Commonly known as: FLOMAX  Take 1 capsule (0.4 mg total) by mouth daily.        Allergies:  Allergies  Allergen Reactions   Phenergan  [Promethazine  Hcl] Swelling    arn swelled and hurt for 3 days, pt would rather not have    Family History: Family History  Problem Relation Age of Onset   Diabetes Father    COPD Father    Colon cancer Neg Hx        does not know paternal side    Social History:  reports that he has never smoked. He has never used  smokeless tobacco. He reports that he does not drink alcohol and does not use drugs.  ROS: All other review of systems were reviewed and are negative except what is noted above in HPI  Physical Exam: BP 119/77   Pulse 88   Constitutional:  Alert and oriented, No acute distress. HEENT: [place] AT, moist mucus membranes.  Trachea midline, no masses. Cardiovascular: No clubbing, cyanosis, or edema. Respiratory: Normal respiratory effort, no increased work of breathing. GI: Abdomen is soft, nontender, nondistended, no abdominal masses GU: No CVA tenderness.  Lymph: No cervical or inguinal lymphadenopathy. Skin: No rashes, bruises or suspicious lesions. Neurologic: Grossly intact, no focal deficits, moving all 4 extremities. Psychiatric: Normal mood and affect.  Laboratory Data: Lab Results  Component Value Date   WBC 5.3 03/08/2024   HGB 15.7 03/08/2024   HCT 45.2 03/08/2024   MCV 88.8 03/08/2024   PLT  221 03/08/2024    Lab Results  Component Value Date   CREATININE 1.19 03/08/2024    No results found for: PSA  No results found for: TESTOSTERONE  No results found for: HGBA1C  Urinalysis    Component Value Date/Time   COLORURINE YELLOW 07/09/2023 0917   APPEARANCEUR Clear 08/03/2024 1201   LABSPEC 1.017 07/09/2023 0917   PHURINE 6.0 07/09/2023 0917   GLUCOSEU Negative 08/03/2024 1201   HGBUR SMALL (A) 07/09/2023 0917   BILIRUBINUR Negative 08/03/2024 1201   KETONESUR NEGATIVE 07/09/2023 0917   PROTEINUR Negative 08/03/2024 1201   PROTEINUR NEGATIVE 07/09/2023 0917   UROBILINOGEN negative (A) 01/30/2020 1421   UROBILINOGEN 0.2 02/24/2009 0912   NITRITE Negative 08/03/2024 1201   NITRITE NEGATIVE 07/09/2023 0917   LEUKOCYTESUR Negative 08/03/2024 1201   LEUKOCYTESUR NEGATIVE 07/09/2023 0917    Lab Results  Component Value Date   LABMICR Comment 08/03/2024   WBCUA 0-5 08/02/2023   LABEPIT 0-10 08/02/2023   BACTERIA None seen 08/02/2023    Pertinent  Imaging: [name] today: Images reviewed and discussed with the patient  Results for orders placed during the hospital encounter of 08/14/24  DG Abd 1 View  Narrative CLINICAL DATA:  Nephrolithiasis.  History of kidney stones.  EXAM: DG ABDOMEN 1V  COMPARISON:  08/03/2024, CT 05/30/2020 FINDINGS:  3 mm stone projects over the lower right kidney. Small calcification in the right pelvis, likely phlebolith. Normal bowel gas pattern, small volume of formed stool in the colon. Calcifications in the midline pelvis are prostatic. Tacks from prior hernia repair. Cholecystectomy clips.  IMPRESSION: 1. A 3 mm stone projects over the lower right kidney. 2. Small calcification in the right pelvis, likely phlebolith.   Electronically Signed By: Andrea Gasman M.D. On: 08/14/2024 09:12  No results found for this or any previous visit.  No results found for this or any previous visit.  No results found for this or any previous visit.  Results for orders placed during the hospital encounter of 07/26/24  Ultrasound renal complete  Narrative CLINICAL DATA:  History of stones, follow-up cysts  EXAM: RENAL / URINARY TRACT ULTRASOUND COMPLETE  COMPARISON:  None Available.  FINDINGS: Right Kidney:  Renal measurements: 11.9 x 5.8 x 5.8 cm = volume: 209 mL. Echogenicity within normal limits. No mass or hydronephrosis visualized. Small calculus in the midportion  Left Kidney:  Renal measurements: 13.6 x 5.9 x 7.4 cm = volume: 3 mL. Echogenicity within normal limits. No mass or hydronephrosis visualized. Simple benign cyst of the superior pole of the left kidney for which no further follow-up or characterization is required.  Bladder:  Appears normal for degree of bladder distention. No significant postvoid residual.  Other:  None.  IMPRESSION: 1. No hydronephrosis. 2. Small nonobstructing calculus in the midportion of the right kidney. 3. Simple benign cyst of the  superior pole of the left kidney for which no further follow-up or characterization is required.   Electronically Signed By: Marolyn JONETTA Jaksch M.D. On: 08/09/2024 19:59  No results found for this or any previous visit.  No results found for this or any previous visit.  No results found for this or any previous visit.   Assessment & Plan:    1. Nephrolithiasis (Primary) Followup 3 months with renal US  - Urinalysis, Routine w reflex microscopic   No follow-ups on file.  [name], MD  [facility] [place]

## 2024-09-05 NOTE — Patient Instructions (Signed)

## 2024-09-06 DIAGNOSIS — L03113 Cellulitis of right upper limb: Secondary | ICD-10-CM | POA: Diagnosis not present

## 2024-09-13 ENCOUNTER — Encounter (INDEPENDENT_AMBULATORY_CARE_PROVIDER_SITE_OTHER): Payer: Self-pay | Admitting: Gastroenterology

## 2024-09-13 ENCOUNTER — Ambulatory Visit (INDEPENDENT_AMBULATORY_CARE_PROVIDER_SITE_OTHER): Admitting: Gastroenterology

## 2024-09-13 ENCOUNTER — Telehealth: Payer: Self-pay | Admitting: *Deleted

## 2024-09-13 VITALS — BP 132/79 | HR 79 | Temp 97.7°F | Ht 71.0 in | Wt 262.5 lb

## 2024-09-13 DIAGNOSIS — R1011 Right upper quadrant pain: Secondary | ICD-10-CM | POA: Diagnosis not present

## 2024-09-13 DIAGNOSIS — K7581 Nonalcoholic steatohepatitis (NASH): Secondary | ICD-10-CM

## 2024-09-13 DIAGNOSIS — K625 Hemorrhage of anus and rectum: Secondary | ICD-10-CM | POA: Diagnosis not present

## 2024-09-13 DIAGNOSIS — R7989 Other specified abnormal findings of blood chemistry: Secondary | ICD-10-CM | POA: Diagnosis not present

## 2024-09-13 DIAGNOSIS — Z860100 Personal history of colon polyps, unspecified: Secondary | ICD-10-CM

## 2024-09-13 MED ORDER — HYDROCORTISONE (PERIANAL) 2.5 % EX CREA: 1.0000 | TOPICAL_CREAM | Freq: Two times a day (BID) | CUTANEOUS | 1 refills | Status: AC

## 2024-09-13 NOTE — Telephone Encounter (Signed)
 LMOVM to call back to give US  appt details and schedule colonoscopy with Dr. Eartha, asa 2 US  scheduled for 10/10, arrival 7:15am, npo midnight

## 2024-09-13 NOTE — Progress Notes (Addendum)
 Referring Provider: Cook, Jayce G, DO Primary Care Physician:  Cook, Jayce G, DO Primary GI Physician: Dr. Eartha   Chief Complaint  Patient presents with   Rectal Bleeding    Pt arrives due to rectal bleeding and blood in stools. Pt states the bleeding could be from hemorrhoids. Does have bright red blood in stool and when wiping. RUQ pain for about a month, described as a constant cramp and does radiate to back. TCS 09/11/21   HPI:   [name] is a 38 y.o. male with past medical history of  arthritis, GERD, gout, kidney stones, HTN and hemorrhoids, fatty liver   Patient presenting today for:  Rectal bleeding RUQ Pain Fatty liver/elevated LFTs   Last seen march by Dr. Eartha, at that time he reported recurrent rectal bleeding with a BM x1 month but subsided on its own.   Sent anusol  suppositories, recommended repeat Colonoscopy 08/2024, serologies done for elevated LFTs  Serologies in march with ANA 1:40, ASMA, A1A, IgG, ceruloplasmin all WNL, Iron studies normal  Not immune to hep A or B, recommended to have vaccinations for these LFTs in march with AST 32, ALT 52, T bili 0.7, Alk phos 69, PLt count 221k   Present: States he has intermittent rectal bleeding that comes and goes. He reports some discomfort/burning at times. He thinks he has a hemorrhoid. Has used some topical spray that he had for them with some improvement. He sees some blood on the stool and sometimes in the toilet. Denies constipation, has looser stools at baseline. No changes in appetite or weightloss. No melena.   He reports he had lithotripsy last month. He endorses some RUQ/R upper back pain since prior to lithotripsy. GERD well controlled on protonix . . He notes that pain is worse with sitting or bending over. Notes eating does not change the pain. No nausea or vomiting.    CT A/P with contrast 06/2023 Right hydronephrosis and ureterectasis down to a 6 mm mid  ureteral calculus.  2. Bilateral  nephrolithiasis.  3. Fatty liver.  Last Colonoscopy:08/2021  - Hemorrhoids found on perianal exam. - Three 4 to 5 mm polyps in the descending colon-2 TAs   - A tattoo was seen in the distal sigmoid colon.  The tattoo site appeared normal. - The distal rectum and anal verge are normal on retroflexion view.   Last Endoscopy:12/20 - Normal esophagus. Biopsied.  - Z-line irregular, 38 cm from the incisors. - 2 cm hiatal hernia.  - No endoscopic esophageal abnormality to explain patient's dysphagia. Esophagus dilated. Biopsied. - Multiple gastric polyps. Biopsied.(negative for H. pylori infection. Gastric polyps are fundic gland polyps.Esophageal biopsy shows changes of GERD and no EoE.)  - Nodular mucosa in the prepyloric region of the stomach. Biopsied. - Normal duodenal bulb and second portion of the duodenum.   Recommendations:  Repeat colonoscopy in 08/2022  [facility] Weights   09/13/24 0920  Weight: 262 lb 8 oz (119.1 kg)     Past Medical History:  Diagnosis Date   Acid reflux    Arthritis    Complication of anesthesia    hard to wake up after anesthesia   Elevated LFTs    mildly elevated transaminases in past, now normalized    GERD (gastroesophageal reflux disease)    Gout    History of kidney stones    Hypertension     Past Surgical History:  Procedure Laterality Date   APPENDECTOMY  01/27/2015   BIOPSY  12/10/2019  Procedure: BIOPSY;  Surgeon: Golda Claudis PENNER, MD;  [facility];  Service: Endoscopy;;  esophagus gastric   CHOLECYSTECTOMY     COLONOSCOPY WITH ESOPHAGOGASTRODUODENOSCOPY (EGD) N/A 02/25/2014   NL TI, 2 SIMPLE ADENOMAS(1:>1 CM), LGE IH-FG POLYPS, PROMINENT AMPULLA. Needs Colonoscopy surveillance in 2018   COLONOSCOPY WITH PROPOFOL  N/A 09/11/2021   Procedure: COLONOSCOPY WITH PROPOFOL ;  Surgeon: Eartha Angelia Sieving, MD;  [facility];  Service: Gastroenterology;  Laterality: N/A;  10:35   ESOPHAGEAL DILATION N/A 08/30/2018    Procedure: ESOPHAGEAL DILATION;  Surgeon: Golda Claudis PENNER, MD;  [facility];  Service: Endoscopy;  Laterality: N/A;   ESOPHAGEAL DILATION N/A 12/10/2019   Procedure: ESOPHAGEAL DILATION;  Surgeon: Golda Claudis PENNER, MD;  [facility];  Service: Endoscopy;  Laterality: N/A;   ESOPHAGOGASTRODUODENOSCOPY N/A 04/29/2015   Dr. Harvey: 1. mild non-erosive gastritis (inflammation) was found in the gastric antrum. 2. Prominent Ampullla 6mm. benign path with pyloric metaplasia   ESOPHAGOGASTRODUODENOSCOPY N/A 07/12/2016   Procedure: ESOPHAGOGASTRODUODENOSCOPY (EGD);  Surgeon: Margo LITTIE Harvey, MD;  [facility];  Service: Endoscopy;  Laterality: N/A;  830   ESOPHAGOGASTRODUODENOSCOPY N/A 08/30/2018   Procedure: ESOPHAGOGASTRODUODENOSCOPY (EGD);  Surgeon: Golda Claudis PENNER, MD;  [facility];  Service: Endoscopy;  Laterality: N/A;  2:00   ESOPHAGOGASTRODUODENOSCOPY (EGD) WITH PROPOFOL  N/A 12/10/2019   Procedure: ESOPHAGOGASTRODUODENOSCOPY (EGD) WITH PROPOFOL ;  Surgeon: Golda Claudis PENNER, MD;  [facility];  Service: Endoscopy;  Laterality: N/A;  12:55   EUS N/A 03/20/2014   Dr. Burnette: prominent major papilla s/p biopsy, minor papilla without clear adenomatous or mass-like appearance, chronic duodenitis on path.    EXTRACORPOREAL SHOCK WAVE LITHOTRIPSY Right 07/12/2023   Procedure: EXTRACORPOREAL SHOCK WAVE LITHOTRIPSY (ESWL);  Surgeon: Sherrilee Belvie LITTIE, MD;  [facility];  Service: Urology;  Laterality: Right;  McKenzie has a surgery before this case.  Office to tell pt to arrive at 10:30 and NPO after midnight.   EXTRACORPOREAL SHOCK WAVE LITHOTRIPSY Right 08/14/2024   Procedure: LITHOTRIPSY, ESWL;  Surgeon: Sherrilee Belvie LITTIE, MD;  [facility];  Service: Urology;  Laterality: Right;   INCISIONAL HERNIA REPAIR N/A 05/02/2019   Procedure: HERNIA REPAIR OPEN INCISIONAL WITH MESH;  Surgeon: Kallie Manuelita BROCKS, MD;  [facility];  Service:  General;  Laterality: N/A;   KIDNEY STONE SURGERY  age 39 and age 62   lithotripsy, stent   POLYPECTOMY  12/10/2019   Procedure: POLYPECTOMY;  Surgeon: Golda Claudis PENNER, MD;  [facility];  Service: Endoscopy;;  gastric   POLYPECTOMY  09/11/2021   Procedure: POLYPECTOMY INTESTINAL;  Surgeon: Eartha Angelia Sieving, MD;  [facility];  Service: Gastroenterology;;   HARLEY DILATION N/A 07/12/2016   Procedure: HARLEY DILATION;  Surgeon: Margo LITTIE Harvey, MD;  [facility];  Service: Endoscopy;  Laterality: N/A;   VENTRAL HERNIA REPAIR N/A 11/28/2019   Procedure: LAPAROSCOPIC VENTRAL HERNIA WITH MESH;  Surgeon: Kallie Manuelita BROCKS, MD;  [facility];  Service: General;  Laterality: N/A;   WISDOM TOOTH EXTRACTION      Current Outpatient Medications  Medication Sig Dispense Refill   allopurinol  (ZYLOPRIM ) 300 MG tablet Take 1 tablet (300 mg total) by mouth at bedtime. 90 tablet 3   amLODipine  (NORVASC ) 5 MG tablet Take 1 tablet (5 mg total) by mouth daily. 90 tablet 3   pantoprazole  (PROTONIX ) 40 MG tablet Take 1 tablet by mouth once daily 90 tablet 0  Potassium Citrate  (UROCIT-K  15) 15 MEQ (1620 MG) TBCR Take 1 tablet by mouth in the morning and at bedtime. 180 tablet 3   tamsulosin  (FLOMAX ) 0.4 MG CAPS capsule Take 1 capsule (0.4 mg total) by mouth daily. (Patient taking differently: Take 0.4 mg by mouth as needed.) 30 capsule 3   No current facility-administered medications for this visit.    Allergies as of 09/13/2024 - Review Complete 09/13/2024  Allergen Reaction Noted   Phenergan  [promethazine  hcl] Swelling 03/13/2014    Social History   Socioeconomic History   Marital status: Married    Spouse name: Not on file   Number of children: 3   Years of education: Not on file   Highest education level: Not on file  Occupational History   Occupation: Therapist, sports: [employer]  Tobacco Use   Smoking status: Never   Smokeless  tobacco: Never  Vaping Use   Vaping status: Never Used  Substance and Sexual Activity   Alcohol use: No   Drug use: No   Sexual activity: Not on file  Other Topics Concern   Not on file  Social History Narrative   Not on file   Social Drivers of Health   Financial Resource Strain: Not on file  Food Insecurity: Not on file  Transportation Needs: Not on file  Physical Activity: Not on file  Stress: Not on file  Social Connections: Not on file    Review of systems General: negative for malaise, night sweats, fever, chills, weight loss Neck: Negative for lumps, goiter, pain and significant neck swelling Resp: Negative for cough, wheezing, dyspnea at rest CV: Negative for chest pain, leg swelling, palpitations, orthopnea GI: denies melena, nausea, vomiting, diarrhea, constipation, dysphagia, odyonophagia, early satiety or unintentional weight loss. +rectal bleeding/pain +RUQ pain  MSK: Negative for joint pain or swelling, back pain, and muscle pain. Derm: Negative for itching or rash Psych: Denies depression, anxiety, memory loss, confusion. No homicidal or suicidal ideation.  Heme: Negative for prolonged bleeding, bruising easily, and swollen nodes. Endocrine: Negative for cold or heat intolerance, polyuria, polydipsia and goiter. Neuro: negative for tremor, gait imbalance, syncope and seizures. The remainder of the review of systems is noncontributory.  Physical Exam: BP 132/79   Pulse 79   Temp 97.7 F (36.5 C)   Ht 5' 11 (1.803 m)   Wt 262 lb 8 oz (119.1 kg)   BMI 36.61 kg/m  General:   Alert and oriented. No distress noted. Pleasant and cooperative.  Head:  Normocephalic and atraumatic. Eyes:  Conjuctiva clear without scleral icterus. Mouth:  Oral mucosa pink and moist. Good dentition. No lesions. Heart: Normal rate and rhythm, s1 and s2 heart sounds present.  Lungs: Clear lung sounds in all lobes. Respirations equal and unlabored. Abdomen:  +BS, soft, and  non-distended. TTP of RUQ/under R ribs. No rebound or guarding. No HSM or masses noted. Derm: No palmar erythema or jaundice Msk:  Symmetrical without gross deformities. Normal posture. Extremities:  Without edema. Neurologic:  Alert and  oriented x4 Psych:  Alert and cooperative. Normal mood and affect.  Invalid input(s): 6 MONTHS   ASSESSMENT: [name] is a 38 y.o. male presenting today for rectal bleeding, RUQ pain, NASH/elevated LFTs  Rectal bleeding: history of the same. Last TCS in 2022 with a few TAs and hemorrhoids. He notes some rectal discomfort and thinks he has a hemorrhoid. He is due for colonoscopy which we will get him scheduled for. I suspect bleeding  is due to hemorrhoids but further evaluation to be done via TCS. Will send anusol  to use BID.   RUQ pain: intermittent x1 month. Not related to eating. No nausea or vomiting. Seems mostly worsened with movement/certain positions, is s/p Cholecystectomy in the past though notably has TTP of RUQ/under R rib. Could be musculoskeletal. Will obtain US  for further evaluation.   NASH/elevated LFTs: serologies done in march all negative. Not immune to Hep A/B. Aminotransferases remained mildly elevated at that time. Last liver imaging via CT in July 2024 with hepatic steatosis. Will update CMP. Provided mediterranean diet and encouraged to avoid ETOH, get atleast 120 minutes physical activity per week.   PLAN:  -RUQ US  -mediterranean diet -routine physical activity, 120 minutes per week -avoid alcohol -anusol  BID x10 days, PRN thereafter -check CMP -schedule Colonoscopy ASA II   All questions were answered, patient verbalized understanding and is in agreement with plan as outlined above.   Follow Up: 4 months   Lety Cullens L. Mariette, MSN, APRN, AGNP-C Adult-Gerontology Nurse Practitioner [facility]  I have reviewed the note and agree with the APP's assessment as described in this progress  note  Toribio Fortune, MD Gastroenterology and Hepatology [facility]

## 2024-09-13 NOTE — H&P (View-Only) (Signed)
 Referring Provider: Cook, Jayce G, DO Primary Care Physician:  Cook, Jayce G, DO Primary GI Physician: Dr. Eartha   Chief Complaint  Patient presents with   Rectal Bleeding    Pt arrives due to rectal bleeding and blood in stools. Pt states the bleeding could be from hemorrhoids. Does have bright red blood in stool and when wiping. RUQ pain for about a month, described as a constant cramp and does radiate to back. TCS 09/11/21   HPI:   [name] is a 38 y.o. male with past medical history of  arthritis, GERD, gout, kidney stones, HTN and hemorrhoids, fatty liver   Patient presenting today for:  Rectal bleeding RUQ Pain Fatty liver/elevated LFTs   Last seen march by Dr. Eartha, at that time he reported recurrent rectal bleeding with a BM x1 month but subsided on its own.   Sent anusol  suppositories, recommended repeat Colonoscopy 08/2024, serologies done for elevated LFTs  Serologies in march with ANA 1:40, ASMA, A1A, IgG, ceruloplasmin all WNL, Iron studies normal  Not immune to hep A or B, recommended to have vaccinations for these LFTs in march with AST 32, ALT 52, T bili 0.7, Alk phos 69, PLt count 221k   Present: States he has intermittent rectal bleeding that comes and goes. He reports some discomfort/burning at times. He thinks he has a hemorrhoid. Has used some topical spray that he had for them with some improvement. He sees some blood on the stool and sometimes in the toilet. Denies constipation, has looser stools at baseline. No changes in appetite or weightloss. No melena.   He reports he had lithotripsy last month. He endorses some RUQ/R upper back pain since prior to lithotripsy. GERD well controlled on protonix . . He notes that pain is worse with sitting or bending over. Notes eating does not change the pain. No nausea or vomiting.    CT A/P with contrast 06/2023 Right hydronephrosis and ureterectasis down to a 6 mm mid  ureteral calculus.  2. Bilateral  nephrolithiasis.  3. Fatty liver.  Last Colonoscopy:08/2021  - Hemorrhoids found on perianal exam. - Three 4 to 5 mm polyps in the descending colon-2 TAs   - A tattoo was seen in the distal sigmoid colon.  The tattoo site appeared normal. - The distal rectum and anal verge are normal on retroflexion view.   Last Endoscopy:12/20 - Normal esophagus. Biopsied.  - Z-line irregular, 38 cm from the incisors. - 2 cm hiatal hernia.  - No endoscopic esophageal abnormality to explain patient's dysphagia. Esophagus dilated. Biopsied. - Multiple gastric polyps. Biopsied.(negative for H. pylori infection. Gastric polyps are fundic gland polyps.Esophageal biopsy shows changes of GERD and no EoE.)  - Nodular mucosa in the prepyloric region of the stomach. Biopsied. - Normal duodenal bulb and second portion of the duodenum.   Recommendations:  Repeat colonoscopy in 08/2022  [facility] Weights   09/13/24 0920  Weight: 262 lb 8 oz (119.1 kg)     Past Medical History:  Diagnosis Date   Acid reflux    Arthritis    Complication of anesthesia    hard to wake up after anesthesia   Elevated LFTs    mildly elevated transaminases in past, now normalized    GERD (gastroesophageal reflux disease)    Gout    History of kidney stones    Hypertension     Past Surgical History:  Procedure Laterality Date   APPENDECTOMY  01/27/2015   BIOPSY  12/10/2019  Procedure: BIOPSY;  Surgeon: Golda Claudis PENNER, MD;  [facility];  Service: Endoscopy;;  esophagus gastric   CHOLECYSTECTOMY     COLONOSCOPY WITH ESOPHAGOGASTRODUODENOSCOPY (EGD) N/A 02/25/2014   NL TI, 2 SIMPLE ADENOMAS(1:>1 CM), LGE IH-FG POLYPS, PROMINENT AMPULLA. Needs Colonoscopy surveillance in 2018   COLONOSCOPY WITH PROPOFOL  N/A 09/11/2021   Procedure: COLONOSCOPY WITH PROPOFOL ;  Surgeon: Eartha Angelia Sieving, MD;  [facility];  Service: Gastroenterology;  Laterality: N/A;  10:35   ESOPHAGEAL DILATION N/A 08/30/2018    Procedure: ESOPHAGEAL DILATION;  Surgeon: Golda Claudis PENNER, MD;  [facility];  Service: Endoscopy;  Laterality: N/A;   ESOPHAGEAL DILATION N/A 12/10/2019   Procedure: ESOPHAGEAL DILATION;  Surgeon: Golda Claudis PENNER, MD;  [facility];  Service: Endoscopy;  Laterality: N/A;   ESOPHAGOGASTRODUODENOSCOPY N/A 04/29/2015   Dr. Harvey: 1. mild non-erosive gastritis (inflammation) was found in the gastric antrum. 2. Prominent Ampullla 6mm. benign path with pyloric metaplasia   ESOPHAGOGASTRODUODENOSCOPY N/A 07/12/2016   Procedure: ESOPHAGOGASTRODUODENOSCOPY (EGD);  Surgeon: Margo LITTIE Harvey, MD;  [facility];  Service: Endoscopy;  Laterality: N/A;  830   ESOPHAGOGASTRODUODENOSCOPY N/A 08/30/2018   Procedure: ESOPHAGOGASTRODUODENOSCOPY (EGD);  Surgeon: Golda Claudis PENNER, MD;  [facility];  Service: Endoscopy;  Laterality: N/A;  2:00   ESOPHAGOGASTRODUODENOSCOPY (EGD) WITH PROPOFOL  N/A 12/10/2019   Procedure: ESOPHAGOGASTRODUODENOSCOPY (EGD) WITH PROPOFOL ;  Surgeon: Golda Claudis PENNER, MD;  [facility];  Service: Endoscopy;  Laterality: N/A;  12:55   EUS N/A 03/20/2014   Dr. Burnette: prominent major papilla s/p biopsy, minor papilla without clear adenomatous or mass-like appearance, chronic duodenitis on path.    EXTRACORPOREAL SHOCK WAVE LITHOTRIPSY Right 07/12/2023   Procedure: EXTRACORPOREAL SHOCK WAVE LITHOTRIPSY (ESWL);  Surgeon: Sherrilee Belvie LITTIE, MD;  Location: [facility];  Service: Urology;  Laterality: Right;  [name] has a surgery before this case.  Office to tell pt to arrive at 10:30 and NPO after midnight.   EXTRACORPOREAL SHOCK WAVE LITHOTRIPSY Right 08/14/2024   Procedure: LITHOTRIPSY, ESWL;  Surgeon: Sherrilee Belvie LITTIE, MD;  Location: [facility];  Service: Urology;  Laterality: Right;   INCISIONAL HERNIA REPAIR N/A 05/02/2019   Procedure: HERNIA REPAIR OPEN INCISIONAL WITH MESH;  Surgeon: Kallie Manuelita BROCKS, MD;  Location: [facility];  Service:  General;  Laterality: N/A;   KIDNEY STONE SURGERY  age 39 and age 62   lithotripsy, stent   POLYPECTOMY  12/10/2019   Procedure: POLYPECTOMY;  Surgeon: Golda Claudis PENNER, MD;  [facility];  Service: Endoscopy;;  gastric   POLYPECTOMY  09/11/2021   Procedure: POLYPECTOMY INTESTINAL;  Surgeon: Eartha Angelia Sieving, MD;  [facility];  Service: Gastroenterology;;   HARLEY DILATION N/A 07/12/2016   Procedure: HARLEY DILATION;  Surgeon: Margo LITTIE Harvey, MD;  [facility];  Service: Endoscopy;  Laterality: N/A;   VENTRAL HERNIA REPAIR N/A 11/28/2019   Procedure: LAPAROSCOPIC VENTRAL HERNIA WITH MESH;  Surgeon: Kallie Manuelita BROCKS, MD;  Location: [facility];  Service: General;  Laterality: N/A;   WISDOM TOOTH EXTRACTION      Current Outpatient Medications  Medication Sig Dispense Refill   allopurinol  (ZYLOPRIM ) 300 MG tablet Take 1 tablet (300 mg total) by mouth at bedtime. 90 tablet 3   amLODipine  (NORVASC ) 5 MG tablet Take 1 tablet (5 mg total) by mouth daily. 90 tablet 3   pantoprazole  (PROTONIX ) 40 MG tablet Take 1 tablet by mouth once daily 90 tablet 0  Potassium Citrate  (UROCIT-K  15) 15 MEQ (1620 MG) TBCR Take 1 tablet by mouth in the morning and at bedtime. 180 tablet 3   tamsulosin  (FLOMAX ) 0.4 MG CAPS capsule Take 1 capsule (0.4 mg total) by mouth daily. (Patient taking differently: Take 0.4 mg by mouth as needed.) 30 capsule 3   No current facility-administered medications for this visit.    Allergies as of 09/13/2024 - Review Complete 09/13/2024  Allergen Reaction Noted   Phenergan  [promethazine  hcl] Swelling 03/13/2014    Social History   Socioeconomic History   Marital status: Married    Spouse name: Not on file   Number of children: 3   Years of education: Not on file   Highest education level: Not on file  Occupational History   Occupation: Therapist, sports: [employer]  Tobacco Use   Smoking status: Never   Smokeless  tobacco: Never  Vaping Use   Vaping status: Never Used  Substance and Sexual Activity   Alcohol use: No   Drug use: No   Sexual activity: Not on file  Other Topics Concern   Not on file  Social History Narrative   Not on file   Social Drivers of Health   Financial Resource Strain: Not on file  Food Insecurity: Not on file  Transportation Needs: Not on file  Physical Activity: Not on file  Stress: Not on file  Social Connections: Not on file    Review of systems General: negative for malaise, night sweats, fever, chills, weight loss Neck: Negative for lumps, goiter, pain and significant neck swelling Resp: Negative for cough, wheezing, dyspnea at rest CV: Negative for chest pain, leg swelling, palpitations, orthopnea GI: denies melena, nausea, vomiting, diarrhea, constipation, dysphagia, odyonophagia, early satiety or unintentional weight loss. +rectal bleeding/pain +RUQ pain  MSK: Negative for joint pain or swelling, back pain, and muscle pain. Derm: Negative for itching or rash Psych: Denies depression, anxiety, memory loss, confusion. No homicidal or suicidal ideation.  Heme: Negative for prolonged bleeding, bruising easily, and swollen nodes. Endocrine: Negative for cold or heat intolerance, polyuria, polydipsia and goiter. Neuro: negative for tremor, gait imbalance, syncope and seizures. The remainder of the review of systems is noncontributory.  Physical Exam: BP 132/79   Pulse 79   Temp 97.7 F (36.5 C)   Ht 5' 11 (1.803 m)   Wt 262 lb 8 oz (119.1 kg)   BMI 36.61 kg/m  General:   Alert and oriented. No distress noted. Pleasant and cooperative.  Head:  Normocephalic and atraumatic. Eyes:  Conjuctiva clear without scleral icterus. Mouth:  Oral mucosa pink and moist. Good dentition. No lesions. Heart: Normal rate and rhythm, s1 and s2 heart sounds present.  Lungs: Clear lung sounds in all lobes. Respirations equal and unlabored. Abdomen:  +BS, soft, and  non-distended. TTP of RUQ/under R ribs. No rebound or guarding. No HSM or masses noted. Derm: No palmar erythema or jaundice Msk:  Symmetrical without gross deformities. Normal posture. Extremities:  Without edema. Neurologic:  Alert and  oriented x4 Psych:  Alert and cooperative. Normal mood and affect.  Invalid input(s): 6 MONTHS   ASSESSMENT: [name] is a 38 y.o. male presenting today for rectal bleeding, RUQ pain, NASH/elevated LFTs  Rectal bleeding: history of the same. Last TCS in 2022 with a few TAs and hemorrhoids. He notes some rectal discomfort and thinks he has a hemorrhoid. He is due for colonoscopy which we will get him scheduled for. I suspect bleeding  is due to hemorrhoids but further evaluation to be done via TCS. Will send anusol  to use BID.   RUQ pain: intermittent x1 month. Not related to eating. No nausea or vomiting. Seems mostly worsened with movement/certain positions, is s/p Cholecystectomy in the past though notably has TTP of RUQ/under R rib. Could be musculoskeletal. Will obtain US  for further evaluation.   NASH/elevated LFTs: serologies done in march all negative. Not immune to Hep A/B. Aminotransferases remained mildly elevated at that time. Last liver imaging via CT in July 2024 with hepatic steatosis. Will update CMP. Provided mediterranean diet and encouraged to avoid ETOH, get atleast 120 minutes physical activity per week.   PLAN:  -RUQ US  -mediterranean diet -routine physical activity, 120 minutes per week -avoid alcohol -anusol  BID x10 days, PRN thereafter -check CMP -schedule Colonoscopy ASA II   All questions were answered, patient verbalized understanding and is in agreement with plan as outlined above.   Follow Up: 4 months   [name] L. Mariette, MSN, APRN, AGNP-C Adult-Gerontology Nurse Practitioner [facility]  I have reviewed the note and agree with the APP's assessment as described in this progress  note  Toribio Fortune, MD Gastroenterology and Hepatology [facility]

## 2024-09-13 NOTE — Patient Instructions (Signed)
 We will get you scheduled for colonoscopy I am going to check liver function and get an US  of your RUQ given pain there I have sent anusol  to use twice daily for hemorrhoids  I am providing the mediterranean diet, this is a guideline to help you know which foods you should eat and those you should try to limit or avoid. It is important to make sure you are getting atleast 30 minutes of exercise 4-5x/week You should aim for 5-7% decrease in overall weight. Fatty liver is usually well managed with dietary and lifestyle modifications, however, in rare cases, this can progress to fibrosis/cirrhosis of the liver, which is serious.  Please avoid alcohol as this can worsen liver issues.  Follow up 4 months  It was a pleasure to see you today. I want to create trusting relationships with patients and provide genuine, compassionate, and quality care. I truly value your feedback! please be on the lookout for a survey regarding your visit with me today. I appreciate your input about our visit and your time in completing this!    Jezreel Justiniano L. Pegah Segel, MSN, APRN, AGNP-C Adult-Gerontology Nurse Practitioner [facility]

## 2024-09-19 MED ORDER — PEG 3350-KCL-NA BICARB-NACL 420 G PO SOLR: 4000.0000 mL | Freq: Once | ORAL | 0 refills | Status: AC

## 2024-09-19 NOTE — Addendum Note (Signed)
 Addended by: DALLIE LIONEL RAMAN on: 09/19/2024 04:01 PM   Modules accepted: Orders

## 2024-09-19 NOTE — Telephone Encounter (Signed)
 LMOVM to call back. Mychart message also sent but not read.

## 2024-09-19 NOTE — Telephone Encounter (Signed)
 Spoke with patient, informed him of ultrasound appt and scheduled TCS for 10/03/2024 at 10:45am. Rx sent to pharmacy. Instructions sent to mychart.

## 2024-09-21 ENCOUNTER — Ambulatory Visit (HOSPITAL_COMMUNITY)
Admission: RE | Admit: 2024-09-21 | Discharge: 2024-09-21 | Disposition: A | Discharge status: Home / Self Care | Source: Ambulatory Visit | Attending: Gastroenterology | Admitting: Gastroenterology

## 2024-09-21 DIAGNOSIS — Z9049 Acquired absence of other specified parts of digestive tract: Secondary | ICD-10-CM | POA: Diagnosis not present

## 2024-09-21 DIAGNOSIS — R7989 Other specified abnormal findings of blood chemistry: Secondary | ICD-10-CM | POA: Diagnosis not present

## 2024-09-21 DIAGNOSIS — R1011 Right upper quadrant pain: Secondary | ICD-10-CM | POA: Insufficient documentation

## 2024-09-24 ENCOUNTER — Encounter (INDEPENDENT_AMBULATORY_CARE_PROVIDER_SITE_OTHER): Admitting: Gastroenterology

## 2024-09-24 NOTE — Telephone Encounter (Signed)
 Carelon PA:  Order ID: [id number]       Authorized  Approval Valid Through: 09/24/2024 - 12/22/2024

## 2024-09-26 ENCOUNTER — Ambulatory Visit (INDEPENDENT_AMBULATORY_CARE_PROVIDER_SITE_OTHER): Payer: Self-pay | Admitting: Gastroenterology

## 2024-09-26 ENCOUNTER — Encounter (INDEPENDENT_AMBULATORY_CARE_PROVIDER_SITE_OTHER): Payer: Self-pay | Admitting: Gastroenterology

## 2024-10-03 ENCOUNTER — Ambulatory Visit (HOSPITAL_COMMUNITY): Admitting: Anesthesiology

## 2024-10-03 ENCOUNTER — Ambulatory Visit (HOSPITAL_COMMUNITY)
Admission: RE | Admit: 2024-10-03 | Discharge: 2024-10-03 | Disposition: A | Discharge status: Home / Self Care | Attending: Gastroenterology | Admitting: Gastroenterology

## 2024-10-03 ENCOUNTER — Encounter (HOSPITAL_COMMUNITY)
Admission: RE | Disposition: A | Discharge status: Home / Self Care | Payer: Self-pay | Source: Home / Self Care | Attending: Gastroenterology

## 2024-10-03 ENCOUNTER — Encounter (HOSPITAL_COMMUNITY): Payer: Self-pay | Admitting: Gastroenterology

## 2024-10-03 ENCOUNTER — Other Ambulatory Visit: Payer: Self-pay

## 2024-10-03 DIAGNOSIS — D122 Benign neoplasm of ascending colon: Secondary | ICD-10-CM | POA: Insufficient documentation

## 2024-10-03 DIAGNOSIS — K625 Hemorrhage of anus and rectum: Secondary | ICD-10-CM | POA: Diagnosis not present

## 2024-10-03 DIAGNOSIS — K760 Fatty (change of) liver, not elsewhere classified: Secondary | ICD-10-CM | POA: Diagnosis not present

## 2024-10-03 DIAGNOSIS — D125 Benign neoplasm of sigmoid colon: Secondary | ICD-10-CM | POA: Insufficient documentation

## 2024-10-03 DIAGNOSIS — K219 Gastro-esophageal reflux disease without esophagitis: Secondary | ICD-10-CM | POA: Insufficient documentation

## 2024-10-03 DIAGNOSIS — Z79899 Other long term (current) drug therapy: Secondary | ICD-10-CM | POA: Diagnosis not present

## 2024-10-03 DIAGNOSIS — K648 Other hemorrhoids: Secondary | ICD-10-CM | POA: Insufficient documentation

## 2024-10-03 DIAGNOSIS — K635 Polyp of colon: Secondary | ICD-10-CM | POA: Diagnosis not present

## 2024-10-03 DIAGNOSIS — D175 Benign lipomatous neoplasm of intra-abdominal organs: Secondary | ICD-10-CM | POA: Insufficient documentation

## 2024-10-03 DIAGNOSIS — K759 Inflammatory liver disease, unspecified: Secondary | ICD-10-CM | POA: Insufficient documentation

## 2024-10-03 DIAGNOSIS — M109 Gout, unspecified: Secondary | ICD-10-CM | POA: Diagnosis not present

## 2024-10-03 DIAGNOSIS — I10 Essential (primary) hypertension: Secondary | ICD-10-CM | POA: Diagnosis not present

## 2024-10-03 DIAGNOSIS — L818 Other specified disorders of pigmentation: Secondary | ICD-10-CM

## 2024-10-03 DIAGNOSIS — M1990 Unspecified osteoarthritis, unspecified site: Secondary | ICD-10-CM | POA: Diagnosis not present

## 2024-10-03 DIAGNOSIS — K6389 Other specified diseases of intestine: Secondary | ICD-10-CM | POA: Diagnosis not present

## 2024-10-03 DIAGNOSIS — Z87442 Personal history of urinary calculi: Secondary | ICD-10-CM | POA: Diagnosis not present

## 2024-10-03 DIAGNOSIS — K649 Unspecified hemorrhoids: Secondary | ICD-10-CM

## 2024-10-03 HISTORY — PX: COLONOSCOPY: SHX5424

## 2024-10-03 LAB — HM COLONOSCOPY

## 2024-10-03 SURGERY — COLONOSCOPY
Anesthesia: General

## 2024-10-03 MED ORDER — LACTATED RINGERS IV SOLN: INTRAVENOUS | Status: DC | PRN

## 2024-10-03 MED ORDER — HYDROCORTISONE ACETATE 25 MG RE SUPP: 25.0000 mg | Freq: Two times a day (BID) | RECTAL | 0 refills | Status: AC

## 2024-10-03 MED ORDER — PROPOFOL 500 MG/50ML IV EMUL
INTRAVENOUS | Status: DC | PRN
  Administered 2024-10-03: 200 ug/kg/min via INTRAVENOUS

## 2024-10-03 MED ORDER — LIDOCAINE 2% (20 MG/ML) 5 ML SYRINGE
INTRAMUSCULAR | Status: DC | PRN
Start: 2024-10-03 — End: 2024-10-03
  Administered 2024-10-03: 100 mg via INTRAVENOUS

## 2024-10-03 MED ORDER — PROPOFOL 10 MG/ML IV BOLUS
INTRAVENOUS | Status: DC | PRN
Start: 2024-10-03 — End: 2024-10-03
  Administered 2024-10-03: 100 mg via INTRAVENOUS

## 2024-10-03 NOTE — Transfer of Care (Signed)
 Immediate Anesthesia Transfer of Care Note  Patient: [name]  Procedure(s) Performed: COLONOSCOPY  Patient Location: Endoscopy Unit  Anesthesia Type:General  Level of Consciousness: awake, alert , oriented, and patient cooperative  Airway & Oxygen Therapy: Patient Spontanous Breathing  Post-op Assessment: Report given to RN, Post -op Vital signs reviewed and stable, and Patient moving all extremities X 4  Post vital signs: Reviewed and stable  Last Vitals:  Vitals Value Taken Time  BP 103/67 10/03/24 11:19  Temp 36.7 C 10/03/24 11:19  Pulse 79 10/03/24 11:19  Resp 20 10/03/24 11:19  SpO2 95 % 10/03/24 11:19    Last Pain:  Vitals:   10/03/24 1119  TempSrc: Oral  PainSc:       Patients Stated Pain Goal: 8 (10/03/24 0931)  Complications: No notable events documented.

## 2024-10-03 NOTE — Discharge Instructions (Addendum)
 You are being discharged to home.  Resume your previous diet.  We are waiting for your pathology results.  Your physician has recommended a repeat colonoscopy in five years for surveillance.  Use Anusol  suppositories every 12 hours for 7 days Will refer for hemorrhoidal banding if bleeding recurs.

## 2024-10-03 NOTE — Op Note (Addendum)
 [facility] Patient Name: [name] Procedure Date: 10/03/2024 10:58 AM MRN: [id number] Date of Birth: Jan 23, 1986 Attending MD: Toribio Fortune , , [id number] CSN: [id number] Age: 38 Admit Type: Outpatient Procedure:                Colonoscopy Indications:              Rectal bleeding Providers:                Toribio Fortune, Harlene Lips, Madelin Hunter,                            RN Referring MD:              Medicines:                Monitored Anesthesia Care Complications:            No immediate complications. Estimated Blood Loss:     Estimated blood loss: none. Procedure:                Pre-Anesthesia Assessment:                           - Prior to the procedure, a History and Physical                            was performed, and patient medications, allergies                            and sensitivities were reviewed. The patient's                            tolerance of previous anesthesia was reviewed.                           - The risks and benefits of the procedure and the                            sedation options and risks were discussed with the                            patient. All questions were answered and informed                            consent was obtained.                           - ASA Grade Assessment: I - A normal, healthy                            patient.                           After obtaining informed consent, the colonoscope                            was passed under direct vision. Throughout the  procedure, the patient's blood pressure, pulse, and                            oxygen saturations were monitored continuously. The                            [id number]-[id number] ([id number]) Peds Colon was introduced                            through the anus and advanced to the the cecum,                            identified by appendiceal orifice and ileocecal                            valve. The colonoscopy  was performed without                            difficulty. The patient tolerated the procedure                            well. The quality of the bowel preparation was                            excellent. Scope In: 10:59:47 AM Scope Out: 11:14:50 AM Scope Withdrawal Time: 0 hours 11 minutes 52 seconds  Total Procedure Duration: 0 hours 15 minutes 3 seconds  Findings:      Hemorrhoids were found on perianal exam.      There was a small lipoma, in the ascending colon.      Two sessile polyps were found in the ascending colon. The polyps were 3       to 6 mm in size. These polyps were removed with a cold snare. Resection       and retrieval were complete.      A 3 mm polyp was found in the sigmoid colon. The polyp was sessile. The       polyp was removed with a cold snare. Resection and retrieval were       complete.      A tattoo was seen in the sigmoid colon. The tattoo site appeared normal.      Non-bleeding internal hemorrhoids were found during retroflexion. The       hemorrhoids were small. Impression:               - Hemorrhoids found on perianal exam.                           - Small lipoma in the ascending colon.                           - Two 3 to 6 mm polyps in the ascending colon,                            removed with a cold snare. Resected and retrieved.                           -  One 3 mm polyp in the sigmoid colon, removed with                            a cold snare. Resected and retrieved.                           - A tattoo was seen in the sigmoid colon. The                            tattoo site appeared normal.                           - Non-bleeding internal hemorrhoids. Moderate Sedation:      Per Anesthesia Care Recommendation:           - Discharge patient to home (ambulatory).                           - Resume previous diet.                           - Await pathology results.                           - Repeat colonoscopy in 5 years for  surveillance.                           - Use Anusol  suppositories every 12 hours for 7 days                           - Will refer for hemorrhoidal banding if bleeding                            recurs. Procedure Code(s):        --- Professional ---                           [id number], Colonoscopy, flexible; with removal of                            tumor(s), polyp(s), or other lesion(s) by snare                            technique Diagnosis Code(s):        --- Professional ---                           D17.5, Benign lipomatous neoplasm of                            intra-abdominal organs                           D12.2, Benign neoplasm of ascending colon                           D12.5, Benign neoplasm of  sigmoid colon                           K64.8, Other hemorrhoids                           K62.5, Hemorrhage of anus and rectum CPT copyright 2022 American Medical Association. All rights reserved. The codes documented in this report are preliminary and upon coder review may  be revised to meet current compliance requirements. Toribio Fortune, MD Toribio Fortune,  10/03/2024 11:20:50 AM This report has been signed electronically. Number of Addenda: 0

## 2024-10-03 NOTE — Interval H&P Note (Signed)
 History and Physical Interval Note:  10/03/2024 9:50 AM  [name]  has presented today for surgery, with the diagnosis of rectal bleeding.  The various methods of treatment have been discussed with the patient and family. After consideration of risks, benefits and other options for treatment, the patient has consented to  Procedure(s) with comments: COLONOSCOPY (N/A) - 10:45am,ASA 2 as a surgical intervention.  The patient's history has been reviewed, patient examined, no change in status, stable for surgery.  I have reviewed the patient's chart and labs.  Questions were answered to the patient's satisfaction.     [name]

## 2024-10-03 NOTE — Anesthesia Preprocedure Evaluation (Signed)
 Anesthesia Evaluation  Patient identified by MRN, date of birth, ID band Patient awake    Reviewed: Allergy & Precautions, H&P , NPO status , Patient's Chart, lab work & pertinent test results, reviewed documented beta blocker date and time   History of Anesthesia Complications (+) history of anesthetic complications  Airway Mallampati: II  TM Distance: >3 FB Neck ROM: full    Dental no notable dental hx.    Pulmonary neg pulmonary ROS   Pulmonary exam normal breath sounds clear to auscultation       Cardiovascular Exercise Tolerance: Good hypertension,  Rhythm:regular Rate:Normal     Neuro/Psych negative neurological ROS  negative psych ROS   GI/Hepatic ,GERD  ,,(+) Hepatitis -  Endo/Other  negative endocrine ROS    Renal/GU Renal disease  negative genitourinary   Musculoskeletal   Abdominal   Peds  Hematology negative hematology ROS (+)   Anesthesia Other Findings   Reproductive/Obstetrics negative OB ROS                              Anesthesia Physical Anesthesia Plan  ASA: 2  Anesthesia Plan: General   Post-op Pain Management:    Induction:   PONV Risk Score and Plan: Propofol  infusion  Airway Management Planned:   Additional Equipment:   Intra-op Plan:   Post-operative Plan:   Informed Consent: I have reviewed the patients History and Physical, chart, labs and discussed the procedure including the risks, benefits and alternatives for the proposed anesthesia with the patient or authorized representative who has indicated his/her understanding and acceptance.     Dental Advisory Given  Plan Discussed with: CRNA  Anesthesia Plan Comments:         Anesthesia Quick Evaluation

## 2024-10-04 ENCOUNTER — Encounter (HOSPITAL_COMMUNITY): Payer: Self-pay | Admitting: Gastroenterology

## 2024-10-04 ENCOUNTER — Ambulatory Visit (INDEPENDENT_AMBULATORY_CARE_PROVIDER_SITE_OTHER): Payer: Self-pay | Admitting: Gastroenterology

## 2024-10-04 ENCOUNTER — Encounter (INDEPENDENT_AMBULATORY_CARE_PROVIDER_SITE_OTHER): Payer: Self-pay | Admitting: *Deleted

## 2024-10-04 LAB — SURGICAL PATHOLOGY

## 2024-10-05 NOTE — Anesthesia Postprocedure Evaluation (Signed)
 Anesthesia Post Note  Patient: [name]  Procedure(s) Performed: COLONOSCOPY  Patient location during evaluation: Phase II Anesthesia Type: General Level of consciousness: awake Pain management: pain level controlled Vital Signs Assessment: post-procedure vital signs reviewed and stable Respiratory status: spontaneous breathing and respiratory function stable Cardiovascular status: blood pressure returned to baseline and stable Postop Assessment: no headache and no apparent nausea or vomiting Anesthetic complications: no Comments: Late entry   No notable events documented.   Last Vitals:  Vitals:   10/03/24 1119 10/03/24 1122  BP: 103/67 107/66  Pulse: 79 75  Resp: 20 20  Temp: 36.7 C   SpO2: 95% 96%    Last Pain:  Vitals:   10/03/24 1122  TempSrc:   PainSc: 0-No pain                 Yvonna JINNY Bosworth

## 2024-10-05 NOTE — Progress Notes (Signed)
 5 yr TCS noted in recall Patient result letter mailed Patient's PCP is on EPIC

## 2024-11-12 ENCOUNTER — Ambulatory Visit (HOSPITAL_COMMUNITY): Attending: Urology

## 2024-11-21 ENCOUNTER — Ambulatory Visit: Admitting: Urology

## 2024-11-30 ENCOUNTER — Other Ambulatory Visit: Payer: Self-pay | Admitting: Family Medicine

## 2024-11-30 DIAGNOSIS — K219 Gastro-esophageal reflux disease without esophagitis: Secondary | ICD-10-CM

## 2024-12-03 ENCOUNTER — Ambulatory Visit (HOSPITAL_COMMUNITY)
Admission: RE | Admit: 2024-12-03 | Discharge: 2024-12-03 | Disposition: A | Discharge status: Home / Self Care | Source: Ambulatory Visit | Attending: Urology | Admitting: Urology

## 2024-12-03 DIAGNOSIS — N200 Calculus of kidney: Secondary | ICD-10-CM | POA: Insufficient documentation

## 2024-12-19 ENCOUNTER — Ambulatory Visit: Admitting: Urology

## 2024-12-19 VITALS — BP 118/74 | HR 66

## 2024-12-19 DIAGNOSIS — M1009 Idiopathic gout, multiple sites: Secondary | ICD-10-CM

## 2024-12-19 DIAGNOSIS — N200 Calculus of kidney: Secondary | ICD-10-CM | POA: Diagnosis not present

## 2024-12-19 LAB — URINALYSIS, ROUTINE W REFLEX MICROSCOPIC
Bilirubin, UA: NEGATIVE
Glucose, UA: NEGATIVE
Ketones, UA: NEGATIVE
Nitrite, UA: NEGATIVE
Protein,UA: NEGATIVE
RBC, UA: NEGATIVE
Specific Gravity, UA: 1.02 (ref 1.005–1.030)
Urobilinogen, Ur: 1 mg/dL (ref 0.2–1.0)
pH, UA: 6 (ref 5.0–7.5)

## 2024-12-19 LAB — MICROSCOPIC EXAMINATION: Bacteria, UA: NONE SEEN

## 2024-12-19 MED ORDER — ALLOPURINOL 300 MG PO TABS: 300.0000 mg | ORAL_TABLET | Freq: Every day | ORAL | 3 refills | Status: AC

## 2024-12-19 MED ORDER — POTASSIUM CITRATE ER 15 MEQ (1620 MG) PO TBCR: 1.0000 | EXTENDED_RELEASE_TABLET | Freq: Two times a day (BID) | ORAL | 3 refills | Status: AC

## 2024-12-19 NOTE — Progress Notes (Signed)
 "  12/19/2024 8:55 AM   Eva LELON Baller 19-May-1986 [id number]  Referring provider: Cook, Jayce G, DO [address] [place],  [place] [address]  Followup nephrolithiasis   HPI: Mr Campos is a 39yo here for followup for nephrolithiasis. He is on urocitK and allopurinol . He denies any stone events recently. No flank pain. NO worsening LUTS. No other complaints today.    PMH: Past Medical History:  Diagnosis Date   Acid reflux    Arthritis    Complication of anesthesia    hard to wake up after anesthesia   Elevated LFTs    mildly elevated transaminases in past, now normalized    GERD (gastroesophageal reflux disease)    Gout    History of kidney stones    Hypertension     Surgical History: Past Surgical History:  Procedure Laterality Date   APPENDECTOMY  01/27/2015   BIOPSY  12/10/2019   Procedure: BIOPSY;  Surgeon: Golda Claudis PENNER, MD;  [facility];  Service: Endoscopy;;  esophagus gastric   CHOLECYSTECTOMY     COLONOSCOPY N/A 10/03/2024   Procedure: COLONOSCOPY;  Surgeon: Eartha Angelia Sieving, MD;  [facility];  Service: Gastroenterology;  Laterality: N/A;  10:45am,ASA 2   COLONOSCOPY WITH ESOPHAGOGASTRODUODENOSCOPY (EGD) N/A 02/25/2014   NL TI, 2 SIMPLE ADENOMAS(1:>1 CM), LGE IH-FG POLYPS, PROMINENT AMPULLA. Needs Colonoscopy surveillance in 2018   COLONOSCOPY WITH PROPOFOL  N/A 09/11/2021   Procedure: COLONOSCOPY WITH PROPOFOL ;  Surgeon: Eartha Angelia Sieving, MD;  [facility];  Service: Gastroenterology;  Laterality: N/A;  10:35   ESOPHAGEAL DILATION N/A 08/30/2018   Procedure: ESOPHAGEAL DILATION;  Surgeon: Golda Claudis PENNER, MD;  [facility];  Service: Endoscopy;  Laterality: N/A;   ESOPHAGEAL DILATION N/A 12/10/2019   Procedure: ESOPHAGEAL DILATION;  Surgeon: Golda Claudis PENNER, MD;  [facility];  Service: Endoscopy;  Laterality: N/A;   ESOPHAGOGASTRODUODENOSCOPY N/A 04/29/2015   Dr. Harvey: 1.  mild non-erosive gastritis (inflammation) was found in the gastric antrum. 2. Prominent Ampullla 6mm. benign path with pyloric metaplasia   ESOPHAGOGASTRODUODENOSCOPY N/A 07/12/2016   Procedure: ESOPHAGOGASTRODUODENOSCOPY (EGD);  Surgeon: Margo LITTIE Harvey, MD;  [facility];  Service: Endoscopy;  Laterality: N/A;  830   ESOPHAGOGASTRODUODENOSCOPY N/A 08/30/2018   Procedure: ESOPHAGOGASTRODUODENOSCOPY (EGD);  Surgeon: Golda Claudis PENNER, MD;  [facility];  Service: Endoscopy;  Laterality: N/A;  2:00   ESOPHAGOGASTRODUODENOSCOPY (EGD) WITH PROPOFOL  N/A 12/10/2019   Procedure: ESOPHAGOGASTRODUODENOSCOPY (EGD) WITH PROPOFOL ;  Surgeon: Golda Claudis PENNER, MD;  [facility];  Service: Endoscopy;  Laterality: N/A;  12:55   EUS N/A 03/20/2014   Dr. Burnette: prominent major papilla s/p biopsy, minor papilla without clear adenomatous or mass-like appearance, chronic duodenitis on path.    EXTRACORPOREAL SHOCK WAVE LITHOTRIPSY Right 07/12/2023   Procedure: EXTRACORPOREAL SHOCK WAVE LITHOTRIPSY (ESWL);  Surgeon: Sherrilee Belvie LITTIE, MD;  Location: [facility];  Service: Urology;  Laterality: Right;  Phillip Sandler has a surgery before this case.  Office to tell pt to arrive at 10:30 and NPO after midnight.   EXTRACORPOREAL SHOCK WAVE LITHOTRIPSY Right 08/14/2024   Procedure: LITHOTRIPSY, ESWL;  Surgeon: Sherrilee Belvie LITTIE, MD;  Location: [facility];  Service: Urology;  Laterality: Right;   INCISIONAL HERNIA REPAIR N/A 05/02/2019   Procedure: HERNIA REPAIR OPEN INCISIONAL WITH MESH;  Surgeon: Kallie Manuelita BROCKS, MD;  Location: [facility];  Service: General;  Laterality: N/A;   KIDNEY STONE SURGERY  age 29 and  age 76   lithotripsy, stent   POLYPECTOMY  12/10/2019   Procedure: POLYPECTOMY;  Surgeon: Golda Claudis PENNER, MD;  [facility];  Service: Endoscopy;;  gastric   POLYPECTOMY  09/11/2021   Procedure: POLYPECTOMY INTESTINAL;  Surgeon: Eartha Angelia Sieving, MD;  [facility];   Service: Gastroenterology;;   HARLEY DILATION N/A 07/12/2016   Procedure: HARLEY DILATION;  Surgeon: Margo LITTIE Haddock, MD;  [facility];  Service: Endoscopy;  Laterality: N/A;   VENTRAL HERNIA REPAIR N/A 11/28/2019   Procedure: LAPAROSCOPIC VENTRAL HERNIA WITH MESH;  Surgeon: Kallie Manuelita BROCKS, MD;  Location: [facility];  Service: General;  Laterality: N/A;   WISDOM TOOTH EXTRACTION      Home Medications:  Allergies as of 12/19/2024       Reactions   Phenergan  [promethazine  Hcl] Swelling   arn swelled and hurt for 3 days, pt would rather not have        Medication List        Accurate as of December 19, 2024  8:55 AM. If you have any questions, ask your nurse or doctor.          allopurinol  300 MG tablet Commonly known as: ZYLOPRIM  Take 1 tablet (300 mg total) by mouth at bedtime.   amLODipine  5 MG tablet Commonly known as: NORVASC  Take 1 tablet (5 mg total) by mouth daily.   hydrocortisone  2.5 % rectal cream Commonly known as: ANUSOL -HC Place 1 Application rectally 2 (two) times daily. Apply BID x 10 days, PRN thereafter   hydrocortisone  25 MG suppository Commonly known as: ANUSOL -HC Place 1 suppository (25 mg total) rectally every 12 (twelve) hours.   pantoprazole  40 MG tablet Commonly known as: PROTONIX  Take 1 tablet by mouth once daily   Potassium Citrate  15 MEQ (1620 MG) Tbcr Commonly known as: Urocit-K  15 Take 1 tablet by mouth in the morning and at bedtime.   tamsulosin  0.4 MG Caps capsule Commonly known as: FLOMAX  Take 1 capsule (0.4 mg total) by mouth daily. What changed:  when to take this reasons to take this        Allergies: Allergies[1]  Family History: Family History  Problem Relation Age of Onset   Diabetes Father    COPD Father    Colon cancer Neg Hx        does not know paternal side    Social History:  reports that he has never smoked. He has never used smokeless tobacco. He reports that he does not drink alcohol and does  not use drugs.  ROS: All other review of systems were reviewed and are negative except what is noted above in HPI  Physical Exam: BP 118/74   Pulse 66   Constitutional:  Alert and oriented, No acute distress. HEENT: [place] AT, moist mucus membranes.  Trachea midline, no masses. Cardiovascular: No clubbing, cyanosis, or edema. Respiratory: Normal respiratory effort, no increased work of breathing. GI: Abdomen is soft, nontender, nondistended, no abdominal masses GU: No CVA tenderness.  Lymph: No cervical or inguinal lymphadenopathy. Skin: No rashes, bruises or suspicious lesions. Neurologic: Grossly intact, no focal deficits, moving all 4 extremities. Psychiatric: Normal mood and affect.  Laboratory Data: Lab Results  Component Value Date   WBC 5.3 03/08/2024   HGB 15.7 03/08/2024   HCT 45.2 03/08/2024   MCV 88.8 03/08/2024   PLT 221 03/08/2024    Lab Results  Component Value Date   CREATININE 1.19 03/08/2024    No results found  for: PSA  No results found for: TESTOSTERONE  No results found for: HGBA1C  Urinalysis    Component Value Date/Time   COLORURINE YELLOW 07/09/2023 0917   APPEARANCEUR Clear 09/05/2024 1316   LABSPEC 1.017 07/09/2023 0917   PHURINE 6.0 07/09/2023 0917   GLUCOSEU Negative 09/05/2024 1316   HGBUR SMALL (A) 07/09/2023 0917   BILIRUBINUR Negative 09/05/2024 1316   KETONESUR NEGATIVE 07/09/2023 0917   PROTEINUR Negative 09/05/2024 1316   PROTEINUR NEGATIVE 07/09/2023 0917   UROBILINOGEN negative (A) 01/30/2020 1421   UROBILINOGEN 0.2 02/24/2009 0912   NITRITE Negative 09/05/2024 1316   NITRITE NEGATIVE 07/09/2023 0917   LEUKOCYTESUR 1+ (A) 09/05/2024 1316   LEUKOCYTESUR NEGATIVE 07/09/2023 0917    Lab Results  Component Value Date   LABMICR See below: 09/05/2024   WBCUA 6-10 (A) 09/05/2024   LABEPIT 0-10 09/05/2024   BACTERIA None seen 09/05/2024    Pertinent Imaging: Renal US  12/03/2024: Images reviewed and discussed with the  patient  Results for orders placed during the hospital encounter of 09/05/24  DG Abd 1 View  Narrative EXAM: 1 VIEW XRAY OF THE ABDOMEN 09/05/2024 12:55:32 PM  COMPARISON: 08/14/2024  CLINICAL HISTORY: Nephrolithiasis.  FINDINGS:  BOWEL: Normal bowel gas pattern. Small-to-moderate colonic stool burden.  SOFT TISSUES: Previously seen 3 mm right renal calculus not definitely visualized on today's exam. Stable pelvic calcification typical of phlebolith. Status post cholecystectomy. Surgical tacks in anterior abdominal wall.  BONES: No acute osseous abnormality.  IMPRESSION: 1. The previous right renal calculus is not seen on the current exam.  Electronically signed by: Andrea Gasman MD 09/10/2024 11:46 PM EDT RP Workstation: [id number]  No results found for this or any previous visit.  No results found for this or any previous visit.  No results found for this or any previous visit.  Results for orders placed during the hospital encounter of 12/03/24  US  RENAL  Narrative CLINICAL DATA:  History of renal calculi.  EXAM: RENAL / URINARY TRACT ULTRASOUND COMPLETE  COMPARISON:  None Available.  FINDINGS: Right Kidney:  Renal measurements: 11.4 x 5.7 x 5.6 cm = volume: 189 mL. No hydronephrosis. No masses. Probable 5-7 mm nonobstructing calculus in the lower pole collecting system.  Left Kidney:  Renal measurements: 13.4 x 6.9 x 5.7 cm = volume: 274 mL. No hydronephrosis or masses. Exophytic simple cyst of the mid to lower cortex measures up to 1.7 cm. This requires no follow-up. No shadowing calculi identified.  Bladder:  Appears normal for degree of bladder distention.  Other:  None.  IMPRESSION: Probable 5-7 mm nonobstructing calculus in the lower pole collecting system of the right kidney. No hydronephrosis bilaterally.   Electronically Signed By: Marcey Moan M.D. On: 12/18/2024 10:12  No results found for this or any previous  visit.  No results found for this or any previous visit.  No results found for this or any previous visit.   Assessment & Plan:    1. Nephrolithiasis (Primary) -We discussed the management of kidney stones. These options include observation, ureteroscopy, shockwave lithotripsy (ESWL) and percutaneous nephrolithotomy (PCNL). We discussed which options are relevant to the patient's stone(s). We discussed the natural history of kidney stones as well as the complications of untreated stones and the impact on quality of life without treatment as well as with each of the above listed treatments. We also discussed the efficacy of each treatment in its ability to clear the stone burden. With any of these management options I discussed the signs  and symptoms of infection and the need for emergent treatment should these be experienced. For each option we discussed the ability of each procedure to clear the patient of their stone burden.   For observation I described the risks which include but are not limited to silent renal damage, life-threatening infection, need for emergent surgery, failure to pass stone and pain.   For ureteroscopy I described the risks which include bleeding, infection, damage to contiguous structures, positioning injury, ureteral stricture, ureteral avulsion, ureteral injury, need for prolonged ureteral stent, inability to perform ureteroscopy, need for an interval procedure, inability to clear stone burden, stent discomfort/pain, heart attack, stroke, pulmonary embolus and the inherent risks with general anesthesia.   For shockwave lithotripsy I described the risks which include arrhythmia, kidney contusion, kidney hemorrhage, need for transfusion, pain, inability to adequately break up stone, inability to pass stone fragments, Steinstrasse, infection associated with obstructing stones, need for alternate surgical procedure, need for repeat shockwave lithotripsy, MI, CVA, PE and the  inherent risks with anesthesia/conscious sedation.   For PCNL I described the risks including positioning injury, pneumothorax, hydrothorax, need for chest tube, inability to clear stone burden, renal laceration, arterial venous fistula or malformation, need for embolization of kidney, loss of kidney or renal function, need for repeat procedure, need for prolonged nephrostomy tube, ureteral avulsion, MI, CVA, PE and the inherent risks of general anesthesia.   - The patient would like to proceed with observation. ZFollowup 6 months with a [name]  - Urinalysis, Routine w reflex microscopic   No follow-ups on file.  [name], MD  [facility] [place]       [1]  Allergies Allergen Reactions   Phenergan  [Promethazine  Hcl] Swelling    arn swelled and hurt for 3 days, pt would rather not have   "

## 2024-12-25 ENCOUNTER — Encounter: Payer: Self-pay | Admitting: Urology

## 2024-12-25 NOTE — Patient Instructions (Signed)

## 2025-02-08 ENCOUNTER — Ambulatory Visit: Admitting: Urology

## 2025-07-03 ENCOUNTER — Ambulatory Visit: Admitting: Urology
